# Patient Record
Sex: Male | Born: 1960 | Race: White | Hispanic: No | Marital: Single | Smoking: Never smoker
Health system: Southern US, Community
[De-identification: ages and names within clinical notes are randomized; demographics above are authoritative.]

## PROBLEM LIST (undated history)

## (undated) DIAGNOSIS — K219 Gastro-esophageal reflux disease without esophagitis: Secondary | ICD-10-CM

## (undated) DIAGNOSIS — G4730 Sleep apnea, unspecified: Secondary | ICD-10-CM

## (undated) DIAGNOSIS — C61 Malignant neoplasm of prostate: Secondary | ICD-10-CM

## (undated) DIAGNOSIS — T7840XA Allergy, unspecified, initial encounter: Secondary | ICD-10-CM

## (undated) DIAGNOSIS — E785 Hyperlipidemia, unspecified: Secondary | ICD-10-CM

## (undated) DIAGNOSIS — F419 Anxiety disorder, unspecified: Secondary | ICD-10-CM

## (undated) DIAGNOSIS — F32A Depression, unspecified: Secondary | ICD-10-CM

## (undated) DIAGNOSIS — R972 Elevated prostate specific antigen [PSA]: Secondary | ICD-10-CM

## (undated) HISTORY — DX: Allergy, unspecified, initial encounter: T78.40XA

## (undated) HISTORY — PX: POLYPECTOMY: SHX149

## (undated) HISTORY — DX: Hyperlipidemia, unspecified: E78.5

## (undated) HISTORY — PX: COLONOSCOPY: SHX174

## (undated) HISTORY — DX: Malignant neoplasm of prostate: C61

## (undated) HISTORY — DX: Sleep apnea, unspecified: G47.30

## (undated) HISTORY — DX: Anxiety disorder, unspecified: F41.9

## (undated) HISTORY — DX: Depression, unspecified: F32.A

## (undated) HISTORY — DX: Gastro-esophageal reflux disease without esophagitis: K21.9

---

## 2004-06-02 ENCOUNTER — Encounter: Admission: RE | Admit: 2004-06-02 | Discharge: 2004-06-02 | Payer: Self-pay | Admitting: Family Medicine

## 2006-03-02 ENCOUNTER — Ambulatory Visit: Payer: Self-pay | Admitting: Psychology

## 2006-03-11 ENCOUNTER — Ambulatory Visit: Payer: Self-pay | Admitting: Psychology

## 2006-03-15 ENCOUNTER — Ambulatory Visit: Payer: Self-pay | Admitting: Psychology

## 2006-03-16 ENCOUNTER — Ambulatory Visit: Payer: Self-pay | Admitting: Psychology

## 2006-03-23 ENCOUNTER — Ambulatory Visit: Payer: Self-pay | Admitting: Psychology

## 2006-03-29 ENCOUNTER — Ambulatory Visit: Payer: Self-pay | Admitting: Psychology

## 2006-04-08 ENCOUNTER — Ambulatory Visit: Payer: Self-pay | Admitting: Psychology

## 2006-04-21 ENCOUNTER — Ambulatory Visit: Payer: Self-pay | Admitting: Psychology

## 2006-05-02 ENCOUNTER — Ambulatory Visit: Payer: Self-pay | Admitting: Psychology

## 2006-05-10 ENCOUNTER — Ambulatory Visit: Payer: Self-pay | Admitting: Psychology

## 2006-05-17 ENCOUNTER — Ambulatory Visit: Payer: Self-pay | Admitting: Psychology

## 2006-06-07 ENCOUNTER — Ambulatory Visit: Payer: Self-pay | Admitting: Psychology

## 2006-06-21 ENCOUNTER — Ambulatory Visit: Payer: Self-pay | Admitting: Psychology

## 2006-07-05 ENCOUNTER — Ambulatory Visit: Payer: Self-pay | Admitting: Psychology

## 2006-08-02 ENCOUNTER — Ambulatory Visit: Payer: Self-pay | Admitting: Psychology

## 2006-10-20 ENCOUNTER — Encounter: Admission: RE | Admit: 2006-10-20 | Discharge: 2006-10-20 | Payer: Self-pay | Admitting: Family Medicine

## 2006-12-29 ENCOUNTER — Ambulatory Visit: Payer: Self-pay | Admitting: Psychology

## 2007-01-10 ENCOUNTER — Ambulatory Visit: Payer: Self-pay | Admitting: Psychology

## 2007-01-25 ENCOUNTER — Ambulatory Visit: Payer: Self-pay | Admitting: Psychology

## 2007-02-06 ENCOUNTER — Ambulatory Visit: Payer: Self-pay | Admitting: Psychology

## 2007-02-13 ENCOUNTER — Encounter: Admission: RE | Admit: 2007-02-13 | Discharge: 2007-02-13 | Payer: Self-pay | Admitting: Family Medicine

## 2007-02-24 ENCOUNTER — Ambulatory Visit: Payer: Self-pay | Admitting: Psychology

## 2007-03-10 ENCOUNTER — Ambulatory Visit: Payer: Self-pay | Admitting: Psychology

## 2007-04-07 ENCOUNTER — Ambulatory Visit: Payer: Self-pay | Admitting: Psychology

## 2007-04-28 ENCOUNTER — Ambulatory Visit: Payer: Self-pay | Admitting: Psychology

## 2007-05-19 ENCOUNTER — Ambulatory Visit: Payer: Self-pay | Admitting: Psychology

## 2007-05-31 ENCOUNTER — Ambulatory Visit: Payer: Self-pay | Admitting: Psychology

## 2007-07-03 ENCOUNTER — Ambulatory Visit: Payer: Self-pay | Admitting: Psychology

## 2007-07-14 ENCOUNTER — Ambulatory Visit: Payer: Self-pay | Admitting: Psychology

## 2007-07-28 ENCOUNTER — Ambulatory Visit: Payer: Self-pay | Admitting: Psychology

## 2007-08-11 ENCOUNTER — Ambulatory Visit: Payer: Self-pay | Admitting: Psychology

## 2008-11-04 ENCOUNTER — Encounter: Admission: RE | Admit: 2008-11-04 | Discharge: 2008-11-04 | Payer: Self-pay | Admitting: Family Medicine

## 2009-11-26 ENCOUNTER — Encounter: Admission: RE | Admit: 2009-11-26 | Discharge: 2009-11-26 | Payer: Self-pay | Admitting: Family Medicine

## 2019-11-09 DIAGNOSIS — Z87442 Personal history of urinary calculi: Secondary | ICD-10-CM

## 2019-11-09 HISTORY — DX: Personal history of urinary calculi: Z87.442

## 2019-12-14 ENCOUNTER — Ambulatory Visit: Payer: Self-pay | Attending: Internal Medicine

## 2019-12-14 ENCOUNTER — Other Ambulatory Visit: Payer: Self-pay

## 2019-12-14 DIAGNOSIS — Z20822 Contact with and (suspected) exposure to covid-19: Secondary | ICD-10-CM | POA: Insufficient documentation

## 2019-12-16 LAB — NOVEL CORONAVIRUS, NAA: SARS-CoV-2, NAA: NOT DETECTED

## 2019-12-31 ENCOUNTER — Other Ambulatory Visit: Payer: Self-pay

## 2020-01-01 ENCOUNTER — Encounter: Payer: Self-pay | Admitting: Family Medicine

## 2020-01-01 ENCOUNTER — Ambulatory Visit (INDEPENDENT_AMBULATORY_CARE_PROVIDER_SITE_OTHER): Payer: BC Managed Care – PPO | Admitting: Family Medicine

## 2020-01-01 VITALS — BP 121/76 | HR 73 | Temp 98.7°F | Ht 70.0 in | Wt 154.0 lb

## 2020-01-01 DIAGNOSIS — H65192 Other acute nonsuppurative otitis media, left ear: Secondary | ICD-10-CM | POA: Diagnosis not present

## 2020-01-01 DIAGNOSIS — H6982 Other specified disorders of Eustachian tube, left ear: Secondary | ICD-10-CM | POA: Diagnosis not present

## 2020-01-01 DIAGNOSIS — Z7689 Persons encountering health services in other specified circumstances: Secondary | ICD-10-CM

## 2020-01-01 DIAGNOSIS — Z23 Encounter for immunization: Secondary | ICD-10-CM | POA: Diagnosis not present

## 2020-01-01 MED ORDER — PREDNISONE 10 MG (21) PO TBPK: ORAL_TABLET | ORAL | 0 refills | Status: DC

## 2020-01-01 NOTE — Addendum Note (Signed)
Addended by: Carrolyn Leigh on: 01/01/2020 05:06 PM   Modules accepted: Orders

## 2020-01-01 NOTE — Progress Notes (Signed)
Subjective: [id number] care, ear fullness HPI: Samuel Powers is a 59 y.o. male presenting to clinic today for:  1.  Ear fullness Patient with history of ear effusion on the left.  He has a recurrence over the last several days.  He feels like he has "water in his ear".  He has had some dulled hearing on that side but does not report any drainage, pain or dizziness.  Symptoms were previously relieved by prednisone Dosepak and is wondering if we can do that today.  He reports compliance with Flonase and Zyrtec.  Past Medical History:  Diagnosis Date  . Allergy   . Hyperlipidemia    Past Surgical History:  Procedure Laterality Date  . TONSILLECTOMY Bilateral 2000   Social History   Socioeconomic History  . Marital status: Single    Spouse name: Not on file  . Number of children: Not on file  . Years of education: Not on file  . Highest education level: Not on file  Occupational History  . Occupation: Engineer, maintenance (IT)  Tobacco Use  . Smoking status: Never Smoker  . Smokeless tobacco: Never Used  Substance and Sexual Activity  . Alcohol use: Not Currently  . Drug use: Never  . Sexual activity: Not on file  Other Topics Concern  . Not on file  Social History Narrative   Recently relocated back home from [place] to be closer to his mother, who resides in [place].   Social Determinants of Health   Financial Resource Strain:   . Difficulty of Paying Living Expenses: Not on file  Food Insecurity:   . Worried About Charity fundraiser in the Last Year: Not on file  . Ran Out of Food in the Last Year: Not on file  Transportation Needs:   . Lack of Transportation (Medical): Not on file  . Lack of Transportation (Non-Medical): Not on file  Physical Activity:   . Days of Exercise per Week: Not on file  . Minutes of Exercise per Session: Not on file  Stress:   . Feeling of Stress : Not on file  Social Connections:   . Frequency of Communication with Friends and Family: Not on  file  . Frequency of Social Gatherings with Friends and Family: Not on file  . Attends Religious Services: Not on file  . Active Member of Clubs or Organizations: Not on file  . Attends Archivist Meetings: Not on file  . Marital Status: Not on file  Intimate Partner Violence:   . Fear of Current or Ex-Partner: Not on file  . Emotionally Abused: Not on file  . Physically Abused: Not on file  . Sexually Abused: Not on file   Current Meds  Medication Sig  . cetirizine (ZYRTEC) 10 MG chewable tablet Chew 10 mg by mouth daily.  . Eszopiclone 3 MG TABS Take 1.5-3 mg by mouth at bedtime as needed.  . fluticasone (FLONASE) 50 MCG/ACT nasal spray Place into both nostrils daily.  . pantoprazole (PROTONIX) 40 MG tablet Take 40 mg by mouth daily.  . rosuvastatin (CRESTOR) 10 MG tablet Take 10 mg by mouth at bedtime.   Family History  Problem Relation Age of Onset  . COPD Mother   . Diabetes Mother   . Hypertension Mother   . Alcohol abuse Father   . COPD Brother    Allergies  Allergen Reactions  . Codeine      Health Maintenance: Second shingles vaccine, TDap  ROS: Per HPI  Objective:  Office vital signs reviewed. BP 121/76   Pulse 73   Temp 98.7 F (37.1 C) (Temporal)   Ht 5\' 10"  (1.778 m)   Wt 154 lb (69.9 kg)   SpO2 100%   BMI 22.10 kg/m   Physical Examination:  General: Awake, alert, well nourished, well appearing, fit male. No acute distress HEENT: Normal, sclera white, MMM; L TM with appreciable effusion. No purulence or erythema Cardio: regular rate and rhythm, S1S2 heard, no murmurs appreciated Pulm: clear to auscultation bilaterally, no wheezes, rhonchi or rales; normal work of breathing on room air Extremities: warm, well perfused, No edema, cyanosis or clubbing; +2 pulses bilaterally MSK: normal gait and station Skin: dry; intact; no rashes or lesions Neuro: no focal neurologic deficits Psych: mood stable, speech normal, affect  appropriate  Depression screen [facility] 2/9 01/01/2020  Decreased Interest 0  Down, Depressed, Hopeless 0  PHQ - 2 Score 0  Altered sleeping 0  Tired, decreased energy 0  Change in appetite 0  Feeling bad or failure about yourself  0  Trouble concentrating 0  Moving slowly or fidgety/restless 0  Suicidal thoughts 0  PHQ-9 Score 0   Assessment/ Plan: 59 y.o. male   1. Acute effusion of left ear Recurrent.  I suspect eustachian tube dysfunction.  We discussed the empty maneuvers to help relieve this.  I will place well prednisone Dosepak to relieve his acute flare.  Continue oral antihistamine and Flonase.  We discussed if this continues to be recurrent issue we should consider evaluation with ear nose and throat.  He seemed amenable to this plan.  He will follow-up in 3 months for annual physical or sooner if needed - predniSONE (STERAPRED UNI-PAK 21 TAB) 10 MG (21) TBPK tablet; As directed x 6 days  Dispense: 21 tablet; Refill: 0  2. Establishing care with new doctor, encounter for Awaiting records from [place].  He was seeing Dr. Ernestina Patches at [facility].  Release of information form completed for colonoscopy.  3. Dysfunction of left eustachian tube  4. Need for tetanus booster Administered during today's visit  [place], [place] [phone]

## 2020-01-01 NOTE — Patient Instructions (Signed)
Plan for fasting labs at next visit, physical  Eustachian Tube Dysfunction  Eustachian tube dysfunction refers to a condition in which a blockage develops in the narrow passage that connects the middle ear to the back of the nose (eustachian tube). The eustachian tube regulates air pressure in the middle ear by letting air move between the ear and nose. It also helps to drain fluid from the middle ear space. Eustachian tube dysfunction can affect one or both ears. When the eustachian tube does not function properly, air pressure, fluid, or both can build up in the middle ear. What are the causes? This condition occurs when the eustachian tube becomes blocked or cannot open normally. Common causes of this condition include:  Ear infections.  Colds and other infections that affect the nose, mouth, and throat (upper respiratory tract).  Allergies.  Irritation from cigarette smoke.  Irritation from stomach acid coming up into the esophagus (gastroesophageal reflux). The esophagus is the tube that carries food from the mouth to the stomach.  Sudden changes in air pressure, such as from descending in an airplane or scuba diving.  Abnormal growths in the nose or throat, such as: ? Growths that line the nose (nasal polyps). ? Abnormal growth of cells (tumors). ? Enlarged tissue at the back of the throat (adenoids). What increases the risk? You are more likely to develop this condition if:  You smoke.  You are overweight.  You are a child who has: ? Certain birth defects of the mouth, such as cleft palate. ? Large tonsils or adenoids. What are the signs or symptoms? Common symptoms of this condition include:  A feeling of fullness in the ear.  Ear pain.  Clicking or popping noises in the ear.  Ringing in the ear.  Hearing loss.  Loss of balance.  Dizziness. Symptoms may get worse when the air pressure around you changes, such as when you travel to an area of high elevation,  fly on an airplane, or go scuba diving. How is this diagnosed? This condition may be diagnosed based on:  Your symptoms.  A physical exam of your ears, nose, and throat.  Tests, such as those that measure: ? The movement of your eardrum (tympanogram). ? Your hearing (audiometry). How is this treated? Treatment depends on the cause and severity of your condition.  In mild cases, you may relieve your symptoms by moving air into your ears. This is called "popping the ears."  In more severe cases, or if you have symptoms of fluid in your ears, treatment may include: ? Medicines to relieve congestion (decongestants). ? Medicines that treat allergies (antihistamines). ? Nasal sprays or ear drops that contain medicines that reduce swelling (steroids). ? A procedure to drain the fluid in your eardrum (myringotomy). In this procedure, a small tube is placed in the eardrum to:  Drain the fluid.  Restore the air in the middle ear space. ? A procedure to insert a balloon device through the nose to inflate the opening of the eustachian tube (balloon dilation). Follow these instructions at home: Lifestyle  Do not do any of the following until your health care provider approves: ? Travel to high altitudes. ? Fly in airplanes. ? Work in a Pension scheme manager or room. ? Scuba dive.  Do not use any products that contain nicotine or tobacco, such as cigarettes and e-cigarettes. If you need help quitting, ask your health care provider.  Keep your ears dry. Wear fitted earplugs during showering and bathing. Dry  your ears completely after. General instructions  Take over-the-counter and prescription medicines only as told by your health care provider.  Use techniques to help pop your ears as recommended by your health care provider. These may include: ? Chewing gum. ? Yawning. ? Frequent, forceful swallowing. ? Closing your mouth, holding your nose closed, and gently blowing as if you are  trying to blow air out of your nose.  Keep all follow-up visits as told by your health care provider. This is important. Contact a health care provider if:  Your symptoms do not go away after treatment.  Your symptoms come back after treatment.  You are unable to pop your ears.  You have: ? A fever. ? Pain in your ear. ? Pain in your head or neck. ? Fluid draining from your ear.  Your hearing suddenly changes.  You become very dizzy.  You lose your balance. Summary  Eustachian tube dysfunction refers to a condition in which a blockage develops in the eustachian tube.  It can be caused by ear infections, allergies, inhaled irritants, or abnormal growths in the nose or throat.  Symptoms include ear pain, hearing loss, or ringing in the ears.  Mild cases are treated with maneuvers to unblock the ears, such as yawning or ear popping.  Severe cases are treated with medicines. Surgery may also be done (rare). This information is not intended to replace advice given to you by your health care provider. Make sure you discuss any questions you have with your health care provider. Document Revised: 02/14/2018 Document Reviewed: 02/14/2018 Elsevier Patient Education  [place].

## 2020-04-09 ENCOUNTER — Other Ambulatory Visit: Payer: Self-pay

## 2020-04-09 ENCOUNTER — Ambulatory Visit (INDEPENDENT_AMBULATORY_CARE_PROVIDER_SITE_OTHER): Payer: 59 | Admitting: Family Medicine

## 2020-04-09 ENCOUNTER — Encounter: Payer: Self-pay | Admitting: Family Medicine

## 2020-04-09 VITALS — BP 117/74 | HR 61 | Temp 97.9°F | Ht 70.0 in | Wt 158.0 lb

## 2020-04-09 DIAGNOSIS — F5101 Primary insomnia: Secondary | ICD-10-CM | POA: Diagnosis not present

## 2020-04-09 DIAGNOSIS — Z125 Encounter for screening for malignant neoplasm of prostate: Secondary | ICD-10-CM

## 2020-04-09 DIAGNOSIS — E785 Hyperlipidemia, unspecified: Secondary | ICD-10-CM | POA: Diagnosis not present

## 2020-04-09 DIAGNOSIS — Z23 Encounter for immunization: Secondary | ICD-10-CM | POA: Diagnosis not present

## 2020-04-09 DIAGNOSIS — Z79899 Other long term (current) drug therapy: Secondary | ICD-10-CM

## 2020-04-09 DIAGNOSIS — Z130 Encounter for screening for diseases of the blood and blood-forming organs and certain disorders involving the immune mechanism: Secondary | ICD-10-CM

## 2020-04-09 DIAGNOSIS — R1032 Left lower quadrant pain: Secondary | ICD-10-CM | POA: Diagnosis not present

## 2020-04-09 DIAGNOSIS — Z1159 Encounter for screening for other viral diseases: Secondary | ICD-10-CM

## 2020-04-09 DIAGNOSIS — Z0000 Encounter for general adult medical examination without abnormal findings: Secondary | ICD-10-CM

## 2020-04-09 DIAGNOSIS — Z0001 Encounter for general adult medical examination with abnormal findings: Secondary | ICD-10-CM

## 2020-04-09 DIAGNOSIS — R351 Nocturia: Secondary | ICD-10-CM

## 2020-04-09 DIAGNOSIS — K219 Gastro-esophageal reflux disease without esophagitis: Secondary | ICD-10-CM

## 2020-04-09 MED ORDER — PANTOPRAZOLE SODIUM 40 MG PO TBEC: 40.0000 mg | DELAYED_RELEASE_TABLET | Freq: Every day | ORAL | 3 refills | Status: DC

## 2020-04-09 MED ORDER — ESZOPICLONE 3 MG PO TABS: 3.0000 mg | ORAL_TABLET | Freq: Every evening | ORAL | 1 refills | Status: DC | PRN

## 2020-04-09 MED ORDER — AMOXICILLIN-POT CLAVULANATE 875-125 MG PO TABS: 1.0000 | ORAL_TABLET | Freq: Three times a day (TID) | ORAL | 0 refills | Status: AC

## 2020-04-09 MED ORDER — ROSUVASTATIN CALCIUM 10 MG PO TABS: 10.0000 mg | ORAL_TABLET | Freq: Every day | ORAL | 3 refills | Status: DC

## 2020-04-09 NOTE — Progress Notes (Signed)
Samuel Powers is a 59 y.o. male presents to office today for annual physical exam examination.    Concerns today include: 1.  Insomnia Patient with longstanding history of insomnia.  This is well relieved by Lunesta 3 mg daily.  He on average gets about 6 hours of restful sleep.  Denies any hallucinations, excessive daytime sleepiness, falls.  Certainly no respiratory depression.  He does not drink alcohol with medication.  2.  Left lower quadrant pain Patient reports several week history of intermittent left lower quadrant pain.  The pain has been worse with certain foods like corn or popcorn.  He had some loose stools with it but these are nonbloody.  Does not report any vomiting, fevers.  He has been told that he has diverticulosis on previous colonoscopy.  Denies any constipation.  Occupation: CPO Diet: Balanced, Exercise: Is trying to incorporate more exercise now that tax season is over. Last eye exam: Up-to-date Last dental exam: Up-to-date Last colonoscopy: Up-to-date Refills needed today: Lunesta Immunizations needed: Tdap  Past Medical History:  Diagnosis Date  . Allergy   . Hyperlipidemia    Social History   Socioeconomic History  . Marital status: Single    Spouse name: Not on file  . Number of children: Not on file  . Years of education: Not on file  . Highest education level: Not on file  Occupational History  . Occupation: Engineer, maintenance (IT)  Tobacco Use  . Smoking status: Never Smoker  . Smokeless tobacco: Never Used  Substance and Sexual Activity  . Alcohol use: Not Currently  . Drug use: Never  . Sexual activity: Not on file  Other Topics Concern  . Not on file  Social History Narrative   Recently relocated back home from [place] to be closer to his mother, who resides in [place].   Social Determinants of Health   Financial Resource Strain:   . Difficulty of Paying Living Expenses:   Food Insecurity:   . Worried About Charity fundraiser in the Last  Year:   . Arboriculturist in the Last Year:   Transportation Needs:   . Film/video editor (Medical):   Marland Kitchen Lack of Transportation (Non-Medical):   Physical Activity:   . Days of Exercise per Week:   . Minutes of Exercise per Session:   Stress:   . Feeling of Stress :   Social Connections:   . Frequency of Communication with Friends and Family:   . Frequency of Social Gatherings with Friends and Family:   . Attends Religious Services:   . Active Member of Clubs or Organizations:   . Attends Archivist Meetings:   Marland Kitchen Marital Status:   Intimate Partner Violence:   . Fear of Current or Ex-Partner:   . Emotionally Abused:   Marland Kitchen Physically Abused:   . Sexually Abused:    Past Surgical History:  Procedure Laterality Date  . TONSILLECTOMY Bilateral 2000   Family History  Problem Relation Age of Onset  . COPD Mother   . Diabetes Mother   . Hypertension Mother   . Alcohol abuse Father   . COPD Brother     Current Outpatient Medications:  .  cetirizine (ZYRTEC) 10 MG chewable tablet, Chew 10 mg by mouth daily., Disp: , Rfl:  .  Eszopiclone 3 MG TABS, Take 1 tablet (3 mg total) by mouth at bedtime as needed., Disp: 90 tablet, Rfl: 1 .  fluticasone (FLONASE) 50 MCG/ACT nasal spray, Place into both  nostrils daily., Disp: , Rfl:  .  pantoprazole (PROTONIX) 40 MG tablet, Take 1 tablet (40 mg total) by mouth daily., Disp: 90 tablet, Rfl: 3 .  rosuvastatin (CRESTOR) 10 MG tablet, Take 1 tablet (10 mg total) by mouth at bedtime., Disp: 90 tablet, Rfl: 3 .  amoxicillin-clavulanate (AUGMENTIN) 875-125 MG tablet, Take 1 tablet by mouth every 8 (eight) hours for 10 days., Disp: 30 tablet, Rfl: 0  Allergies  Allergen Reactions  . Codeine      ROS: Review of Systems Pertinent items noted in HPI and remainder of comprehensive ROS otherwise negative.    Physical exam BP 117/74   Pulse 61   Temp 97.9 F (36.6 C)   Ht 5' 10"  (1.778 m)   Wt 158 lb (71.7 kg)   SpO2 100%   BMI  22.67 kg/m  General appearance: alert, cooperative, appears stated age and no distress Head: Normocephalic, without obvious abnormality, atraumatic Eyes: negative findings: lids and lashes normal, conjunctivae and sclerae normal, corneas clear and pupils equal, round, reactive to light and accomodation Ears: normal TM's and external ear canals both ears Nose: Nares normal. Septum midline. Mucosa normal. No drainage or sinus tenderness. Throat: lips, mucosa, and tongue normal; teeth and gums normal Neck: no adenopathy, no carotid bruit, supple, symmetrical, trachea midline and thyroid not enlarged, symmetric, no tenderness/mass/nodules Back: symmetric, no curvature. ROM normal. No CVA tenderness. Lungs: clear to auscultation bilaterally Chest wall: no tenderness Heart: regular rate and rhythm, S1, S2 normal, no murmur, click, rub or gallop Abdomen: soft, non-tender; bowel sounds normal; no masses,  no organomegaly and TTP NOT reproducible on exam today Extremities: extremities normal, atraumatic, no cyanosis or edema Pulses: 2+ and symmetric Skin: Skin color, texture, turgor normal. No rashes or lesions or Well-healed scar noted along the right nasolabial fold Lymph nodes: Cervical, supraclavicular, and axillary nodes normal. Neurologic: Alert and oriented X 3, normal strength and tone. Normal symmetric reflexes. Normal coordination and gait Psych: Mood stable, speech normal, affect appropriate pleasant and interactive  Depression screen [facility] 2/9 04/09/2020 01/01/2020  Decreased Interest 2 0  Down, Depressed, Hopeless 1 0  PHQ - 2 Score 3 0  Altered sleeping 1 0  Tired, decreased energy 1 0  Change in appetite 0 0  Feeling bad or failure about yourself  0 0  Trouble concentrating 1 0  Moving slowly or fidgety/restless 0 0  Suicidal thoughts 0 0  PHQ-9 Score 6 0  Difficult doing work/chores Not difficult at all -   No flowsheet data found.   Assessment/ Plan: Samuel Powers here for  annual physical exam.   1. Annual physical exam  2. Primary insomnia Stable.  Has had a little bit increased stress with relations to his mother's health and recent completion of tax season.  He is trying to make some time for himself and incorporating more exercise.  Should he have ongoing symptoms despite, plan to discuss possible initiation of SSRI - ToxASSURE Select 13 (MW), Urine - Eszopiclone 3 MG TABS; Take 1 tablet (3 mg total) by mouth at bedtime as needed.  Dispense: 90 tablet; Refill: 1  3. Controlled substance agreement signed UDS and controlled substance contract completed per office policy.  Lunesta renewed.  No red flags.  National narcotic database was reviewed. - ToxASSURE Select 13 (MW), Urine  4. Hyperlipidemia, unspecified hyperlipidemia type Fasting lab ordered - Lipid Panel - CMP14+EGFR - TSH - rosuvastatin (CRESTOR) 10 MG tablet; Take 1 tablet (10 mg total) by  mouth at bedtime.  Dispense: 90 tablet; Refill: 3  5. Screening, anemia, deficiency, iron - CBC  6. Nocturia - PSA  7. Screening for malignant neoplasm of prostate - PSA  8. Encounter for hepatitis C screening test for low risk patient Has been previously screened for HIV in the past.  No history of hepatitis C screen. - Hepatitis C antibody  9. LLQ pain Going to empirically cover for possible diverticulitis with Augmentin.  Asked to use a probiotic.  Patient understands red flag signs and symptoms - amoxicillin-clavulanate (AUGMENTIN) 875-125 MG tablet; Take 1 tablet by mouth every 8 (eight) hours for 10 days.  Dispense: 30 tablet; Refill: 0  10. Gastroesophageal reflux disease without esophagitis Stable - pantoprazole (PROTONIX) 40 MG tablet; Take 1 tablet (40 mg total) by mouth daily.  Dispense: 90 tablet; Refill: 3   Handout on healthy lifestyle choices, including diet (rich in fruits, vegetables and lean meats and low in salt and simple carbohydrates) and exercise (at least 30 minutes of  moderate physical activity daily).  Patient to follow up in 1 year for annual exam or sooner if needed.  Oniyah Rohe M. Lajuana Ripple, DO

## 2020-04-09 NOTE — Patient Instructions (Signed)
You had labs performed today.  You will be contacted with the results of the labs once they are available, usually in the next 3 business days for routine lab work.  If you have an active my chart account, they will be released to your MyChart.  If you prefer to have these labs released to you via telephone, please let us know.  If you had a pap smear or biopsy performed, expect to be contacted in about 7-10 days.   Preventive Care 40-57 Years Old, Male Preventive care refers to lifestyle choices and visits with your health care provider that can promote health and wellness. This includes:  A yearly physical exam. This is also called an annual well check.  Regular dental and eye exams.  Immunizations.  Screening for certain conditions.  Healthy lifestyle choices, such as eating a healthy diet, getting regular exercise, not using drugs or products that contain nicotine and tobacco, and limiting alcohol use. What can I expect for my preventive care visit? Physical exam Your health care provider will check:  Height and weight. These may be used to calculate body mass index (BMI), which is a measurement that tells if you are at a healthy weight.  Heart rate and blood pressure.  Your skin for abnormal spots. Counseling Your health care provider may ask you questions about:  Alcohol, tobacco, and drug use.  Emotional well-being.  Home and relationship well-being.  Sexual activity.  Eating habits.  Work and work environment. What immunizations do I need?  Influenza (flu) vaccine  This is recommended every year. Tetanus, diphtheria, and pertussis (Tdap) vaccine  You may need a Td booster every 10 years. Varicella (chickenpox) vaccine  You may need this vaccine if you have not already been vaccinated. Zoster (shingles) vaccine  You may need this after age 60. Measles, mumps, and rubella (MMR) vaccine  You may need at least one dose of MMR if you were born in 1957 or  later. You may also need a second dose. Pneumococcal conjugate (PCV13) vaccine  You may need this if you have certain conditions and were not previously vaccinated. Pneumococcal polysaccharide (PPSV23) vaccine  You may need one or two doses if you smoke cigarettes or if you have certain conditions. Meningococcal conjugate (MenACWY) vaccine  You may need this if you have certain conditions. Hepatitis A vaccine  You may need this if you have certain conditions or if you travel or work in places where you may be exposed to hepatitis A. Hepatitis B vaccine  You may need this if you have certain conditions or if you travel or work in places where you may be exposed to hepatitis B. Haemophilus influenzae type b (Hib) vaccine  You may need this if you have certain risk factors. Human papillomavirus (HPV) vaccine  If recommended by your health care provider, you may need three doses over 6 months. You may receive vaccines as individual doses or as more than one vaccine together in one shot (combination vaccines). Talk with your health care provider about the risks and benefits of combination vaccines. What tests do I need? Blood tests  Lipid and cholesterol levels. These may be checked every 5 years, or more frequently if you are over 50 years old.  Hepatitis C test.  Hepatitis B test. Screening  Lung cancer screening. You may have this screening every year starting at age 55 if you have a 30-pack-year history of smoking and currently smoke or have quit within the past 15 years.    Prostate cancer screening. Recommendations will vary depending on your family history and other risks.  Colorectal cancer screening. All adults should have this screening starting at age 50 and continuing until age 75. Your health care provider may recommend screening at age 45 if you are at increased risk. You will have tests every 1-10 years, depending on your results and the type of screening  test.  Diabetes screening. This is done by checking your blood sugar (glucose) after you have not eaten for a while (fasting). You may have this done every 1-3 years.  Sexually transmitted disease (STD) testing. Follow these instructions at home: Eating and drinking  Eat a diet that includes fresh fruits and vegetables, whole grains, lean protein, and low-fat dairy products.  Take vitamin and mineral supplements as recommended by your health care provider.  Do not drink alcohol if your health care provider tells you not to drink.  If you drink alcohol: ? Limit how much you have to 0-2 drinks a day. ? Be aware of how much alcohol is in your drink. In the [place] one drink equals one 12 oz bottle of beer (355 mL), one 5 oz glass of wine (148 mL), or one 1 oz glass of hard liquor (44 mL). Lifestyle  Take daily care of your teeth and gums.  Stay active. Exercise for at least 30 minutes on 5 or more days each week.  Do not use any products that contain nicotine or tobacco, such as cigarettes, e-cigarettes, and chewing tobacco. If you need help quitting, ask your health care provider.  If you are sexually active, practice safe sex. Use a condom or other form of protection to prevent STIs (sexually transmitted infections).  Talk with your health care provider about taking a low-dose aspirin every day starting at age 50. What's next?  Go to your health care provider once a year for a well check visit.  Ask your health care provider how often you should have your eyes and teeth checked.  Stay up to date on all vaccines. This information is not intended to replace advice given to you by your health care provider. Make sure you discuss any questions you have with your health care provider. Document Revised: 10/19/2018 Document Reviewed: 10/19/2018 Elsevier Patient Education  2020 [employer]   

## 2020-04-10 LAB — CMP14+EGFR
ALT: 14 IU/L (ref 0–44)
AST: 19 IU/L (ref 0–40)
Albumin/Globulin Ratio: 2.1 (ref 1.2–2.2)
Albumin: 4.5 g/dL (ref 3.8–4.9)
Alkaline Phosphatase: 51 IU/L (ref 48–121)
BUN/Creatinine Ratio: 15 (ref 9–20)
BUN: 14 mg/dL (ref 6–24)
Bilirubin Total: 0.9 mg/dL (ref 0.0–1.2)
CO2: 26 mmol/L (ref 20–29)
Calcium: 9.7 mg/dL (ref 8.7–10.2)
Chloride: 101 mmol/L (ref 96–106)
Creatinine, Ser: 0.92 mg/dL (ref 0.76–1.27)
GFR calc Af Amer: 106 mL/min/{1.73_m2} (ref >59)
GFR calc non Af Amer: 92 mL/min/{1.73_m2} (ref >59)
Globulin, Total: 2.1 g/dL (ref 1.5–4.5)
Glucose: 90 mg/dL (ref 65–99)
Potassium: 4.5 mmol/L (ref 3.5–5.2)
Sodium: 141 mmol/L (ref 134–144)
Total Protein: 6.6 g/dL (ref 6.0–8.5)

## 2020-04-10 LAB — CBC
Hematocrit: 40.5 % (ref 37.5–51.0)
Hemoglobin: 13.8 g/dL (ref 13.0–17.7)
MCH: 30.7 pg (ref 26.6–33.0)
MCHC: 34.1 g/dL (ref 31.5–35.7)
MCV: 90 fL (ref 79–97)
Platelets: 329 10*3/uL (ref 150–450)
RBC: 4.49 x10E6/uL (ref 4.14–5.80)
RDW: 12.3 % (ref 11.6–15.4)
WBC: 3.3 10*3/uL — ABNORMAL LOW (ref 3.4–10.8)

## 2020-04-10 LAB — LIPID PANEL
Chol/HDL Ratio: 2.9 ratio (ref 0.0–5.0)
Cholesterol, Total: 179 mg/dL (ref 100–199)
HDL: 61 mg/dL (ref >39)
LDL Chol Calc (NIH): 105 mg/dL — ABNORMAL HIGH (ref 0–99)
Triglycerides: 71 mg/dL (ref 0–149)
VLDL Cholesterol Cal: 13 mg/dL (ref 5–40)

## 2020-04-10 LAB — HEPATITIS C ANTIBODY: Hep C Virus Ab: <0.1 s/co ratio (ref 0.0–0.9)

## 2020-04-10 LAB — TSH: TSH: 3.79 u[IU]/mL (ref 0.450–4.500)

## 2020-04-10 LAB — PSA: Prostate Specific Ag, Serum: 3.8 ng/mL (ref 0.0–4.0)

## 2020-04-11 ENCOUNTER — Other Ambulatory Visit: Payer: Self-pay | Admitting: Family Medicine

## 2020-04-11 DIAGNOSIS — D72819 Decreased white blood cell count, unspecified: Secondary | ICD-10-CM

## 2020-04-12 LAB — TOXASSURE SELECT 13 (MW), URINE

## 2020-04-24 ENCOUNTER — Encounter: Payer: Self-pay | Admitting: Nurse Practitioner

## 2020-04-24 ENCOUNTER — Telehealth (INDEPENDENT_AMBULATORY_CARE_PROVIDER_SITE_OTHER): Payer: 59 | Admitting: Nurse Practitioner

## 2020-04-24 DIAGNOSIS — R319 Hematuria, unspecified: Secondary | ICD-10-CM

## 2020-04-24 LAB — URINALYSIS, COMPLETE
Bilirubin, UA: NEGATIVE
Glucose, UA: NEGATIVE
Ketones, UA: NEGATIVE
Leukocytes,UA: NEGATIVE
Nitrite, UA: NEGATIVE
Specific Gravity, UA: 1.025 (ref 1.005–1.030)
Urobilinogen, Ur: 0.2 mg/dL (ref 0.2–1.0)
pH, UA: 6.5 (ref 5.0–7.5)

## 2020-04-24 LAB — MICROSCOPIC EXAMINATION
Bacteria, UA: NONE SEEN
RBC: >30 /hpf — AB (ref 0–2)
Renal Epithel, UA: NONE SEEN /hpf
WBC, UA: NONE SEEN /hpf (ref 0–5)

## 2020-04-24 NOTE — Progress Notes (Signed)
° °  Virtual Visit via telephone Note Due to COVID-19 pandemic this visit was conducted virtually. This visit type was conducted due to national recommendations for restrictions regarding the COVID-19 Pandemic (e.g. social distancing, sheltering in place) in an effort to limit this patient's exposure and mitigate transmission in our community. All issues noted in this document were discussed and addressed.  A physical exam was not performed with this format.  I connected with Samuel Powers on 04/24/20 at 9:55 by telephone and verified that I am speaking with the correct person using two identifiers. Samuel Powers is currently located at home and  No one is currently with him during visit. The provider, Mary-Margaret Hassell Done, FNP is located in their office at time of visit.  I discussed the limitations, risks, security and privacy concerns of performing an evaluation and management service by telephone and the availability of in person appointments. I also discussed with the patient that there may be a patient responsible charge related to this service. The patient expressed understanding and agreed to proceed.   History and Present Illness:   Chief Complaint: hematuria  HPI Patient calls in stating that he went walking yesterday evening and when he got home and went to they bathroom, there was blood in his urine. He denies any pain, frequency or urgency.   Review of Systems  Genitourinary: Positive for hematuria. Negative for dysuria, flank pain, frequency and urgency.     Observations/Objective: Alert and oriented- answers all questions appropriately No distress    Assessment and Plan: Samuel Powers in today with chief complaint of Urinary Tract Infection   1. Hematuria, unspecified type Force fluids Keep watch and document freq of hematuria - Ambulatory referral to Urology     Follow Up Instructions: prn    I discussed the assessment and treatment plan with the patient.  The patient was provided an opportunity to ask questions and all were answered. The patient agreed with the plan and demonstrated an understanding of the instructions.   The patient was advised to call back or seek an in-person evaluation if the symptoms worsen or if the condition fails to improve as anticipated.  The above assessment and management plan was discussed with the patient. The patient verbalized understanding of and has agreed to the management plan. Patient is aware to call the clinic if symptoms persist or worsen. Patient is aware when to return to the clinic for a follow-up visit. Patient educated on when it is appropriate to go to the emergency department.   Time call ended:  10:10  I provided 20 minutes of non-face-to-face time during this encounter.    Mary-Margaret Hassell Done, FNP

## 2020-04-24 NOTE — Addendum Note (Signed)
Addended by: Rolena Infante on: 04/24/2020 04:00 PM   Modules accepted: Orders

## 2020-04-29 ENCOUNTER — Encounter: Payer: Self-pay | Admitting: Family Medicine

## 2020-06-12 NOTE — Progress Notes (Signed)
Subjective: 1. Gross hematuria   2. Microhematuria      Samuel Powers is a 59 yo male who is sent by Dr. Lajuana Ripple for gross hematuria after a walk with >30 RBC's on a UA on 04/24/20.  His Cr was 0.92 on 04/09/20.  He had it again over 05/11/20.  He has not seen it since.  He has intermittent urgency with some intermittency or straining to void.   He subsequently has had some right flank pain.  His UA today is clear.   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  He has no other GU history or complaints.   ROS:  ROS  Allergies  Allergen Reactions  . Codeine     Past Medical History:  Diagnosis Date  . Allergy   . Anxiety   . Depression   . GERD (gastroesophageal reflux disease)   . Hyperlipidemia   . Sleep apnea     Past Surgical History:  Procedure Laterality Date  . TONSILLECTOMY Bilateral 2000    Social History   Socioeconomic History  . Marital status: Single    Spouse name: Not on file  . Number of children: Not on file  . Years of education: Not on file  . Highest education level: Not on file  Occupational History  . Occupation: Engineer, maintenance (IT)  Tobacco Use  . Smoking status: Never Smoker  . Smokeless tobacco: Never Used  Vaping Use  . Vaping Use: Never used  Substance and Sexual Activity  . Alcohol use: Not Currently  . Drug use: Never  . Sexual activity: Not on file  Other Topics Concern  . Not on file  Social History Narrative   Recently relocated back home from [place] to be closer to his mother, who resides in [place].   Social Determinants of Health   Financial Resource Strain:   . Difficulty of Paying Living Expenses:   Food Insecurity:   . Worried About Charity fundraiser in the Last Year:   . Arboriculturist in the Last Year:   Transportation Needs:   . Film/video editor (Medical):   Marland Kitchen Lack of Transportation (Non-Medical):   Physical Activity:   . Days of Exercise per Week:   . Minutes  of Exercise per Session:   Stress:   . Feeling of Stress :   Social Connections:   . Frequency of Communication with Friends and Family:   . Frequency of Social Gatherings with Friends and Family:   . Attends Religious Services:   . Active Member of Clubs or Organizations:   . Attends Archivist Meetings:   Marland Kitchen Marital Status:   Intimate Partner Violence:   . Fear of Current or Ex-Partner:   . Emotionally Abused:   Marland Kitchen Physically Abused:   . Sexually Abused:     Family History  Problem Relation Age of Onset  . COPD Mother   . Diabetes Mother   . Hypertension Mother   . Alcohol abuse Father   . COPD Brother     Anti-infectives: Anti-infectives (From admission, onward)   None      Current Outpatient Medications  Medication Sig Dispense Refill  . cetirizine (ZYRTEC) 10 MG chewable tablet Chew 10 mg by mouth daily.    . Eszopiclone 3 MG TABS Take 1 tablet (3 mg total) by mouth at bedtime as needed. 90 tablet 1  . fluticasone (FLONASE) 50 MCG/ACT nasal  spray Place into both nostrils daily.    . pantoprazole (PROTONIX) 40 MG tablet Take 1 tablet (40 mg total) by mouth daily. 90 tablet 3  . rosuvastatin (CRESTOR) 10 MG tablet Take 1 tablet (10 mg total) by mouth at bedtime. 90 tablet 3  . valACYclovir (VALTREX) 1000 MG tablet Take 1,000 mg by mouth 2 (two) times daily.     No current facility-administered medications for this visit.     Objective: Vital signs in last 24 hours: BP 136/75   Pulse 60   Temp 98.1 F (36.7 C)   Ht 5' 10.5" (1.791 m)   Wt 152 lb (68.9 kg)   BMI 21.50 kg/m   Intake/Output from previous day: No intake/output data recorded. Intake/Output this shift: @IOTHISSHIFT @   Physical Exam Vitals reviewed.  Constitutional:      Appearance: Normal appearance.  HENT:     Head: Normocephalic and atraumatic.  Cardiovascular:     Rate and Rhythm: Normal rate and regular rhythm.  Pulmonary:     Effort: Pulmonary effort is normal. No  respiratory distress.     Breath sounds: Normal breath sounds.  Abdominal:     General: Abdomen is flat.     Tenderness: There is right CVA tenderness.  Genitourinary:    Comments: Normal phallus with adequate meatus. Scrotum, testes and epididymis normal. AP without lesions. NST without mass. Prostate 2+ benign with non-palpable SV's.  Musculoskeletal:        General: No swelling or tenderness. Normal range of motion.     Cervical back: Normal range of motion and neck supple.  Skin:    General: Skin is warm and dry.  Neurological:     General: No focal deficit present.     Mental Status: He is alert and oriented to person, place, and time.  Psychiatric:        Mood and Affect: Mood normal.        Behavior: Behavior normal.     Lab Results:  Results for orders placed or performed in visit on 06/13/20 (from the past 24 hour(s))  Urinalysis, Routine w reflex microscopic     Status: None   Collection Time: 06/13/20 11:08 AM  Result Value Ref Range   Specific Gravity, UA 1.025 1.005 - 1.030   pH, UA 6.0 5.0 - 7.5   Color, UA Yellow Yellow   Appearance Ur Clear Clear   Leukocytes,UA Negative Negative   Protein,UA Negative Negative/Trace   Glucose, UA Negative Negative   Ketones, UA Negative Negative   RBC, UA Negative Negative   Bilirubin, UA Negative Negative   Urobilinogen, Ur 0.2 0.2 - 1.0 mg/dL   Nitrite, UA Negative Negative   Microscopic Examination Comment    Narrative   Performed at:  [facility] [address], [place], [place]  [id number] Lab Director: Elizabeth, Phone:  [phone]    BMET No results for input(s): NA, K, CL, CO2, GLUCOSE, BUN, CREATININE, CALCIUM in the last 72 hours. PT/INR No results for input(s): LABPROT, INR in the last 72 hours. ABG No results for input(s): PHART, HCO3 in the last 72 hours.  Invalid input(s): PCO2, PO2  Studies/Results: I reviewed his CT from 2008.   The testicle was in the canal on the left  but it is down on exam today.    Assessment/Plan: Gross hematuria He has had intermittent gross hematuria with some irritative voiding symptoms and right flank pain.   I will get him set up  for a CT hematuria study with return for possible cystoscopy.      No orders of the defined types were placed in this encounter.    Orders Placed This Encounter  Procedures  . CT HEMATURIA WORKUP    Next available    Standing Status:   Future    Standing Expiration Date:   07/14/2020    Order Specific Question:   Reason for Exam (SYMPTOM  OR DIAGNOSIS REQUIRED)    Answer:   gross hematuria    Order Specific Question:   Preferred imaging location?    Answer:   [facility]    Order Specific Question:   Radiology Contrast Protocol - do NOT remove file path    Answer:   [email or web address]  . Urinalysis, Routine w reflex microscopic     Return for Next available with CT results for possible cystoscopy.    CC: Dr. Adam Phenix.      Irine Seal 06/13/2020 [phone]

## 2020-06-13 ENCOUNTER — Encounter: Payer: Self-pay | Admitting: Urology

## 2020-06-13 ENCOUNTER — Other Ambulatory Visit: Payer: Self-pay

## 2020-06-13 ENCOUNTER — Ambulatory Visit (INDEPENDENT_AMBULATORY_CARE_PROVIDER_SITE_OTHER): Payer: 59 | Admitting: Urology

## 2020-06-13 VITALS — BP 136/75 | HR 60 | Temp 98.1°F | Ht 70.5 in | Wt 152.0 lb

## 2020-06-13 DIAGNOSIS — R310 Gross hematuria: Secondary | ICD-10-CM

## 2020-06-13 DIAGNOSIS — R3129 Other microscopic hematuria: Secondary | ICD-10-CM | POA: Diagnosis not present

## 2020-06-13 LAB — URINALYSIS, ROUTINE W REFLEX MICROSCOPIC
Bilirubin, UA: NEGATIVE
Glucose, UA: NEGATIVE
Ketones, UA: NEGATIVE
Leukocytes,UA: NEGATIVE
Nitrite, UA: NEGATIVE
Protein,UA: NEGATIVE
RBC, UA: NEGATIVE
Specific Gravity, UA: 1.025 (ref 1.005–1.030)
Urobilinogen, Ur: 0.2 mg/dL (ref 0.2–1.0)
pH, UA: 6 (ref 5.0–7.5)

## 2020-06-13 NOTE — Progress Notes (Signed)
Urological Symptom Review  Patient is experiencing the following symptoms: Frequent urination Get up at night to urinate Stream starts and stops Trouble starting stream Have to strain to urinate Blood in urine   Review of Systems  Gastrointestinal (upper)  : Negative for upper GI symptoms  Gastrointestinal (lower) : Negative for lower GI symptoms  Constitutional : Negative for symptoms  Skin: Negative for skin symptoms  Eyes: Negative for eye symptoms  Ear/Nose/Throat : Sinus problems  Hematologic/Lymphatic: Negative for Hematologic/Lymphatic symptoms  Cardiovascular : Negative for cardiovascular symptoms  Respiratory : Negative for respiratory symptoms  Endocrine: Negative for endocrine symptoms  Musculoskeletal: Back pain  Neurological: Negative for neurological symptoms  Psychologic: Depression Anxiety

## 2020-06-13 NOTE — Assessment & Plan Note (Signed)
He has had intermittent gross hematuria with some irritative voiding symptoms and right flank pain.   I will get him set up for a CT hematuria study with return for possible cystoscopy.

## 2020-06-18 ENCOUNTER — Encounter: Payer: Self-pay | Admitting: *Deleted

## 2020-07-11 ENCOUNTER — Telehealth: Payer: Self-pay

## 2020-07-11 ENCOUNTER — Other Ambulatory Visit: Payer: Self-pay

## 2020-07-11 ENCOUNTER — Ambulatory Visit (HOSPITAL_COMMUNITY)
Admission: RE | Admit: 2020-07-11 | Discharge: 2020-07-11 | Disposition: A | Discharge status: Home / Self Care | Payer: 59 | Source: Ambulatory Visit | Attending: Urology | Admitting: Urology

## 2020-07-11 DIAGNOSIS — R310 Gross hematuria: Secondary | ICD-10-CM | POA: Diagnosis present

## 2020-07-11 LAB — POCT I-STAT CREATININE: Creatinine, Ser: 0.9 mg/dL (ref 0.61–1.24)

## 2020-07-11 MED ORDER — IOHEXOL 300 MG/ML  SOLN
125.0000 mL | Freq: Once | INTRAMUSCULAR | Status: AC | PRN
  Administered 2020-07-11: 125 mL via INTRAVENOUS

## 2020-07-11 NOTE — Telephone Encounter (Signed)
-----   Message from Irine Seal, MD sent at 07/11/2020  9:13 AM EDT ----- He has a small stone in the bladder.  He should f/u as planned.

## 2020-07-11 NOTE — Telephone Encounter (Signed)
Pt notified of results. Pt did report he recently was in [facility] and noticed bleeding. Called his old dr there and wrote for 10 days of Bactrim. Pt reports symptoms have improved.

## 2020-07-24 NOTE — Progress Notes (Signed)
Subjective: 1. Gross hematuria   2. BPH with urinary obstruction   3. Weak urinary stream   4. Urgency of urination   5. Bladder stone      Samuel Powers is a 59 yo male who is sent by Dr. Lajuana Ripple for gross hematuria after a walk with >30 RBC's on a UA on 04/24/20.  His Cr was 0.92 on 04/09/20.  He had it again over 05/11/20.  He has not seen it since.  He has intermittent urgency with some intermittency or straining to void.   He subsequently has had some right flank pain.  His UA today is clear.   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  He has no other GU history or complaints.   07/25/20:  [name] returns today in f/u.   He continues to have some right flank pain that is more of a discomfort and he has some frequency and urgency with straining to void.  His PSA is 3.8.  He had a CT that showed a 38mm bladder stone.  With bladder wall thickening and trilobar hyperplasia.  His IPSS remains elevated at 29.    IPSS    Row Name 07/25/20 1500         International Prostate Symptom Score   How often have you had the sensation of not emptying your bladder? More than half the time     How often have you had to urinate less than every two hours? Almost always     How often have you found you stopped and started again several times when you urinated? Almost always     How often have you found it difficult to postpone urination? More than half the time     How often have you had a weak urinary stream? More than half the time     How often have you had to strain to start urination? More than half the time     How many times did you typically get up at night to urinate? 3 Times     Total IPSS Score 29       Quality of Life due to urinary symptoms   If you were to spend the rest of your life with your urinary condition just the way it is now how would you feel about that? Unhappy            ROS:  ROS  Allergies  Allergen Reactions  .  Codeine     Past Medical History:  Diagnosis Date  . Allergy   . Anxiety   . Depression   . GERD (gastroesophageal reflux disease)   . Hyperlipidemia   . Sleep apnea     Past Surgical History:  Procedure Laterality Date  . TONSILLECTOMY Bilateral 2000    Social History   Socioeconomic History  . Marital status: Single    Spouse name: Not on file  . Number of children: Not on file  . Years of education: Not on file  . Highest education level: Not on file  Occupational History  . Occupation: Engineer, maintenance (IT)  Tobacco Use  . Smoking status: Never Smoker  . Smokeless tobacco: Never Used  Vaping Use  . Vaping Use: Never used  Substance and Sexual Activity  . Alcohol use: Not Currently  . Drug use: Never  . Sexual activity: Not on file  Other Topics Concern  . Not on file  Social History  Narrative   Recently relocated back home from [place] to be closer to his mother, who resides in [place].   Social Determinants of Health   Financial Resource Strain:   . Difficulty of Paying Living Expenses: Not on file  Food Insecurity:   . Worried About Charity fundraiser in the Last Year: Not on file  . Ran Out of Food in the Last Year: Not on file  Transportation Needs:   . Lack of Transportation (Medical): Not on file  . Lack of Transportation (Non-Medical): Not on file  Physical Activity:   . Days of Exercise per Week: Not on file  . Minutes of Exercise per Session: Not on file  Stress:   . Feeling of Stress : Not on file  Social Connections:   . Frequency of Communication with Friends and Family: Not on file  . Frequency of Social Gatherings with Friends and Family: Not on file  . Attends Religious Services: Not on file  . Active Member of Clubs or Organizations: Not on file  . Attends Archivist Meetings: Not on file  . Marital Status: Not on file  Intimate Partner Violence:   . Fear of Current or Ex-Partner: Not on file  . Emotionally Abused: Not on file  .  Physically Abused: Not on file  . Sexually Abused: Not on file    Family History  Problem Relation Age of Onset  . COPD Mother   . Diabetes Mother   . Hypertension Mother   . Alcohol abuse Father   . COPD Brother     Anti-infectives: Anti-infectives (From admission, onward)   None      Current Outpatient Medications  Medication Sig Dispense Refill  . cetirizine (ZYRTEC) 10 MG chewable tablet Chew 10 mg by mouth daily.    . Eszopiclone 3 MG TABS Take 1 tablet (3 mg total) by mouth at bedtime as needed. 90 tablet 1  . fluticasone (FLONASE) 50 MCG/ACT nasal spray Place into both nostrils daily.    . pantoprazole (PROTONIX) 40 MG tablet Take 1 tablet (40 mg total) by mouth daily. 90 tablet 3  . rosuvastatin (CRESTOR) 10 MG tablet Take 1 tablet (10 mg total) by mouth at bedtime. 90 tablet 3  . sulfamethoxazole-trimethoprim (BACTRIM DS) 800-160 MG tablet Take 1 tablet by mouth 2 (two) times daily.    . valACYclovir (VALTREX) 1000 MG tablet Take 1,000 mg by mouth 2 (two) times daily.    Marland Kitchen alfuzosin (UROXATRAL) 10 MG 24 hr tablet Take 1 tablet (10 mg total) by mouth daily with breakfast. 30 tablet 11   No current facility-administered medications for this visit.     Objective: Vital signs in last 24 hours: BP 135/66   Pulse 70   Temp 98 F (36.7 C)   Ht 5' 10.5" (1.791 m)   Wt 152 lb (68.9 kg)   BMI 21.50 kg/m   Intake/Output from previous day: No intake/output data recorded. Intake/Output this shift: @IOTHISSHIFT @   Physical Exam  Lab Results:  Results for orders placed or performed in visit on 07/25/20 (from the past 24 hour(s))  Urinalysis, Routine w reflex microscopic     Status: Abnormal   Collection Time: 07/25/20  2:46 PM  Result Value Ref Range   Specific Gravity, UA >1.030 (H) 1.005 - 1.030   pH, UA 5.5 5.0 - 7.5   Color, UA Yellow Yellow   Appearance Ur Clear Clear   Leukocytes,UA Negative Negative   Protein,UA Negative Negative/Trace  Glucose, UA  Negative Negative   Ketones, UA Negative Negative   RBC, UA Negative Negative   Bilirubin, UA Negative Negative   Urobilinogen, Ur 0.2 0.2 - 1.0 mg/dL   Nitrite, UA Negative Negative   Microscopic Examination Comment    Narrative   Performed at:  [facility] [address] [place], [place]  [id number] Lab Director: Arjay, Phone:  [phone]    BMET No results for input(s): NA, K, CL, CO2, GLUCOSE, BUN, CREATININE, CALCIUM in the last 72 hours. PT/INR No results for input(s): LABPROT, INR in the last 72 hours. ABG No results for input(s): PHART, HCO3 in the last 72 hours.  Invalid input(s): PCO2, PO2  Studies/Results: I reviewed his CT from 2008.   The testicle was in the canal on the left but it is down on exam today.    Assessment/Plan: BPH with BOO and Bladder stone.   The CT shows a 15mm bladder stone with bladder wall thickening consistent with bladder outlet obstruction.  He has severe LUTS.   I discussed cystolithalopaxy and also reviewed the options for managing the outlet obstruction including Medication,  TURP, possible TUIP, Urolift and Rezum.  At this time he would like to have the stone removed and try medication before commiting to a surgical approach to management.  I will send alfuzosin and reviewed the side effects.  I also reviewed the risks and benefits of the TURP in detail as well as the risk of the cystolithalopaxy including bleeding, infection, injury to the urinary tract with stricturing, difficulty voiding and anesthetic complications.    Elevated PSA.  His PSA is 3.8 so I will do a prostate [place] and biopsy at the time of the cystolithalopaxy as a prostate cancer would impact possible treatment options for the outlet obstruction.   Risks of bleeding, infection and voiding difficulty reviewed.        Meds ordered this encounter  Medications  . alfuzosin (UROXATRAL) 10 MG 24 hr tablet    Sig: Take 1 tablet (10 mg total) by mouth daily  with breakfast.    Dispense:  30 tablet    Refill:  11     Orders Placed This Encounter  Procedures  . Urinalysis, Routine w reflex microscopic     Return for Next available schedule surgery. .    CC: Dr. Adam Phenix.      Irine Seal 07/25/2020 [phone]

## 2020-07-24 NOTE — H&P (View-Only) (Signed)
Subjective: 1. Gross hematuria   2. BPH with urinary obstruction   3. Weak urinary stream   4. Urgency of urination   5. Bladder stone      Samuel Powers is a 59 yo male who is sent by Dr. Lajuana Ripple for gross hematuria after a walk with >30 RBC's on a UA on 04/24/20.  His Cr was 0.92 on 04/09/20.  He had it again over 05/11/20.  He has not seen it since.  He has intermittent urgency with some intermittency or straining to void.   He subsequently has had some right flank pain.  His UA today is clear.   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  He has no other GU history or complaints.   07/25/20:  Samuel Powers returns today in f/u.   He continues to have some right flank pain that is more of a discomfort and he has some frequency and urgency with straining to void.  His PSA is 3.8.  He had a CT that showed a 29mm bladder stone.  With bladder wall thickening and trilobar hyperplasia.  His IPSS remains elevated at 29.    IPSS    Row Name 07/25/20 1500         International Prostate Symptom Score   How often have you had the sensation of not emptying your bladder? More than half the time     How often have you had to urinate less than every two hours? Almost always     How often have you found you stopped and started again several times when you urinated? Almost always     How often have you found it difficult to postpone urination? More than half the time     How often have you had a weak urinary stream? More than half the time     How often have you had to strain to start urination? More than half the time     How many times did you typically get up at night to urinate? 3 Times     Total IPSS Score 29       Quality of Life due to urinary symptoms   If you were to spend the rest of your life with your urinary condition just the way it is now how would you feel about that? Unhappy            ROS:  ROS  Allergies  Allergen Reactions  .  Codeine     Past Medical History:  Diagnosis Date  . Allergy   . Anxiety   . Depression   . GERD (gastroesophageal reflux disease)   . Hyperlipidemia   . Sleep apnea     Past Surgical History:  Procedure Laterality Date  . TONSILLECTOMY Bilateral 2000    Social History   Socioeconomic History  . Marital status: Single    Spouse name: Not on file  . Number of children: Not on file  . Years of education: Not on file  . Highest education level: Not on file  Occupational History  . Occupation: Engineer, maintenance (IT)  Tobacco Use  . Smoking status: Never Smoker  . Smokeless tobacco: Never Used  Vaping Use  . Vaping Use: Never used  Substance and Sexual Activity  . Alcohol use: Not Currently  . Drug use: Never  . Sexual activity: Not on file  Other Topics Concern  . Not on file  Social History  Narrative   Recently relocated back home from [place] to be closer to his mother, who resides in [place].   Social Determinants of Health   Financial Resource Strain:   . Difficulty of Paying Living Expenses: Not on file  Food Insecurity:   . Worried About Charity fundraiser in the Last Year: Not on file  . Ran Out of Food in the Last Year: Not on file  Transportation Needs:   . Lack of Transportation (Medical): Not on file  . Lack of Transportation (Non-Medical): Not on file  Physical Activity:   . Days of Exercise per Week: Not on file  . Minutes of Exercise per Session: Not on file  Stress:   . Feeling of Stress : Not on file  Social Connections:   . Frequency of Communication with Friends and Family: Not on file  . Frequency of Social Gatherings with Friends and Family: Not on file  . Attends Religious Services: Not on file  . Active Member of Clubs or Organizations: Not on file  . Attends Archivist Meetings: Not on file  . Marital Status: Not on file  Intimate Partner Violence:   . Fear of Current or Ex-Partner: Not on file  . Emotionally Abused: Not on file  .  Physically Abused: Not on file  . Sexually Abused: Not on file    Family History  Problem Relation Age of Onset  . COPD Mother   . Diabetes Mother   . Hypertension Mother   . Alcohol abuse Father   . COPD Brother     Anti-infectives: Anti-infectives (From admission, onward)   None      Current Outpatient Medications  Medication Sig Dispense Refill  . cetirizine (ZYRTEC) 10 MG chewable tablet Chew 10 mg by mouth daily.    . Eszopiclone 3 MG TABS Take 1 tablet (3 mg total) by mouth at bedtime as needed. 90 tablet 1  . fluticasone (FLONASE) 50 MCG/ACT nasal spray Place into both nostrils daily.    . pantoprazole (PROTONIX) 40 MG tablet Take 1 tablet (40 mg total) by mouth daily. 90 tablet 3  . rosuvastatin (CRESTOR) 10 MG tablet Take 1 tablet (10 mg total) by mouth at bedtime. 90 tablet 3  . sulfamethoxazole-trimethoprim (BACTRIM DS) 800-160 MG tablet Take 1 tablet by mouth 2 (two) times daily.    . valACYclovir (VALTREX) 1000 MG tablet Take 1,000 mg by mouth 2 (two) times daily.    Marland Kitchen alfuzosin (UROXATRAL) 10 MG 24 hr tablet Take 1 tablet (10 mg total) by mouth daily with breakfast. 30 tablet 11   No current facility-administered medications for this visit.     Objective: Vital signs in last 24 hours: BP 135/66   Pulse 70   Temp 98 F (36.7 C)   Ht 5' 10.5" (1.791 m)   Wt 152 lb (68.9 kg)   BMI 21.50 kg/m   Intake/Output from previous day: No intake/output data recorded. Intake/Output this shift: @IOTHISSHIFT @   Physical Exam  Lab Results:  Results for orders placed or performed in visit on 07/25/20 (from the past 24 hour(s))  Urinalysis, Routine w reflex microscopic     Status: Abnormal   Collection Time: 07/25/20  2:46 PM  Result Value Ref Range   Specific Gravity, UA >1.030 (H) 1.005 - 1.030   pH, UA 5.5 5.0 - 7.5   Color, UA Yellow Yellow   Appearance Ur Clear Clear   Leukocytes,UA Negative Negative   Protein,UA Negative Negative/Trace  Glucose, UA  Negative Negative   Ketones, UA Negative Negative   RBC, UA Negative Negative   Bilirubin, UA Negative Negative   Urobilinogen, Ur 0.2 0.2 - 1.0 mg/dL   Nitrite, UA Negative Negative   Microscopic Examination Comment    Narrative   Performed at:  [facility] [address], [place], [place]  [id number] Lab Director: [place], Phone:  [phone]    BMET No results for input(s): NA, K, CL, CO2, GLUCOSE, BUN, CREATININE, CALCIUM in the last 72 hours. PT/INR No results for input(s): LABPROT, INR in the last 72 hours. ABG No results for input(s): PHART, HCO3 in the last 72 hours.  Invalid input(s): PCO2, PO2  Studies/Results: I reviewed his CT from 2008.   The testicle was in the canal on the left but it is down on exam today.    Assessment/Plan: BPH with BOO and Bladder stone.   The CT shows a 31mm bladder stone with bladder wall thickening consistent with bladder outlet obstruction.  He has severe LUTS.   I discussed cystolithalopaxy and also reviewed the options for managing the outlet obstruction including Medication,  TURP, possible TUIP, Urolift and Rezum.  At this time he would like to have the stone removed and try medication before commiting to a surgical approach to management.  I will send alfuzosin and reviewed the side effects.  I also reviewed the risks and benefits of the TURP in detail as well as the risk of the cystolithalopaxy including bleeding, infection, injury to the urinary tract with stricturing, difficulty voiding and anesthetic complications.    Elevated PSA.  His PSA is 3.8 so I will do a prostate [place] and biopsy at the time of the cystolithalopaxy as a prostate cancer would impact possible treatment options for the outlet obstruction.   Risks of bleeding, infection and voiding difficulty reviewed.        Meds ordered this encounter  Medications  . alfuzosin (UROXATRAL) 10 MG 24 hr tablet    Sig: Take 1 tablet (10 mg total) by mouth daily  with breakfast.    Dispense:  30 tablet    Refill:  11     Orders Placed This Encounter  Procedures  . Urinalysis, Routine w reflex microscopic     Return for Next available schedule surgery. .    CC: Dr. Adam Phenix.      Irine Seal 07/25/2020 [phone]

## 2020-07-25 ENCOUNTER — Ambulatory Visit (INDEPENDENT_AMBULATORY_CARE_PROVIDER_SITE_OTHER): Payer: 59 | Admitting: Urology

## 2020-07-25 ENCOUNTER — Encounter: Payer: Self-pay | Admitting: Urology

## 2020-07-25 ENCOUNTER — Other Ambulatory Visit: Payer: Self-pay

## 2020-07-25 VITALS — BP 135/66 | HR 70 | Temp 98.0°F | Ht 70.5 in | Wt 152.0 lb

## 2020-07-25 DIAGNOSIS — R3915 Urgency of urination: Secondary | ICD-10-CM | POA: Diagnosis not present

## 2020-07-25 DIAGNOSIS — N401 Enlarged prostate with lower urinary tract symptoms: Secondary | ICD-10-CM

## 2020-07-25 DIAGNOSIS — R310 Gross hematuria: Secondary | ICD-10-CM

## 2020-07-25 DIAGNOSIS — R3912 Poor urinary stream: Secondary | ICD-10-CM

## 2020-07-25 DIAGNOSIS — N210 Calculus in bladder: Secondary | ICD-10-CM

## 2020-07-25 DIAGNOSIS — N138 Other obstructive and reflux uropathy: Secondary | ICD-10-CM

## 2020-07-25 LAB — URINALYSIS, ROUTINE W REFLEX MICROSCOPIC
Bilirubin, UA: NEGATIVE
Glucose, UA: NEGATIVE
Ketones, UA: NEGATIVE
Leukocytes,UA: NEGATIVE
Nitrite, UA: NEGATIVE
Protein,UA: NEGATIVE
RBC, UA: NEGATIVE
Specific Gravity, UA: >1.03 — ABNORMAL HIGH (ref 1.005–1.030)
Urobilinogen, Ur: 0.2 mg/dL (ref 0.2–1.0)
pH, UA: 5.5 (ref 5.0–7.5)

## 2020-07-25 MED ORDER — ALFUZOSIN HCL ER 10 MG PO TB24: 10.0000 mg | ORAL_TABLET | Freq: Every day | ORAL | 11 refills | Status: DC

## 2020-07-25 NOTE — Progress Notes (Signed)
Urological Symptom Review  Patient is experiencing the following symptoms: Frequent urination Hard to postpone urination Get up at night to urinate Stream starts and stops Trouble starting stream Blood in urine  Bladder stones   Review of Systems  Gastrointestinal (upper)  : Negative for upper GI symptoms  Gastrointestinal (lower) : Negative for lower GI symptoms  Constitutional : Negative for symptoms  Skin: Negative for skin symptoms  Eyes: Negative for eye symptoms  Ear/Nose/Throat : Negative for Ear/Nose/Throat symptoms  Hematologic/Lymphatic: Negative for Hematologic/Lymphatic symptoms  Cardiovascular : Negative for cardiovascular symptoms  Respiratory : Negative for respiratory symptoms  Endocrine: Negative for endocrine symptoms  Musculoskeletal: Negative for musculoskeletal symptoms  Neurological: Negative for neurological symptoms  Psychologic: Negative for psychiatric symptoms

## 2020-07-29 ENCOUNTER — Encounter: Payer: Self-pay | Admitting: Urology

## 2020-07-29 ENCOUNTER — Telehealth: Payer: Self-pay | Admitting: Urology

## 2020-07-29 NOTE — Telephone Encounter (Signed)
Pt called wanting to know about getting his surgery scheduled. Please call him when you get a chance. Thanks!

## 2020-07-30 NOTE — Telephone Encounter (Signed)
See mychart messages

## 2020-08-05 ENCOUNTER — Other Ambulatory Visit (HOSPITAL_COMMUNITY): Payer: Self-pay | Admitting: Urology

## 2020-08-05 ENCOUNTER — Telehealth: Payer: Self-pay | Admitting: Urology

## 2020-08-05 ENCOUNTER — Other Ambulatory Visit: Payer: Self-pay | Admitting: Urology

## 2020-08-05 DIAGNOSIS — R972 Elevated prostate specific antigen [PSA]: Secondary | ICD-10-CM

## 2020-08-05 NOTE — Telephone Encounter (Signed)
You are doing a cystoscopy with litholapaxy on 10/7. He will need a 1-3wk f/u. Ok to double book for 10/15 or 10/29? You are out of town the week of the 22nd.

## 2020-08-05 NOTE — Telephone Encounter (Signed)
Patient called and asked for a nurse to return his call regarding his upcoming procedure. He also states he needs post op appt 1-3 weeks after his procedure. Dr Jeffie Pollock is booked, if you dont mind seeing when we could get this pt in.

## 2020-08-05 NOTE — Telephone Encounter (Signed)
Let's shoot for 10/29

## 2020-08-08 ENCOUNTER — Encounter (HOSPITAL_BASED_OUTPATIENT_CLINIC_OR_DEPARTMENT_OTHER): Payer: Self-pay | Admitting: Urology

## 2020-08-08 ENCOUNTER — Other Ambulatory Visit: Payer: Self-pay

## 2020-08-08 NOTE — Progress Notes (Signed)
Spoke w/ via phone for pre-op interview---pt Lab needs dos----I stat 8 (gent)               Lab results------none COVID test ------08-11-20 855 am Arrive at -------530 am 08-14-20 NPO after MN NO Solid Food.  Clear liquids from MN until---430 am then npo Medications to take morning of surgery -----certrizine, flonase, alfuzosin, pantaprazole Diabetic medication -----n/a Patient Special Instructions -----follow fleets enema instructions from dr Jeffie Pollock Pre-Op special Istructions -----none Patient verbalized understanding of instructions that were given at this phone interview. Patient denies shortness of breath, chest pain, fever, cough at this phone interview.

## 2020-08-11 ENCOUNTER — Other Ambulatory Visit (HOSPITAL_COMMUNITY)
Admission: RE | Admit: 2020-08-11 | Discharge: 2020-08-11 | Disposition: A | Discharge status: Home / Self Care | Payer: 59 | Source: Ambulatory Visit | Attending: Urology | Admitting: Urology

## 2020-08-11 DIAGNOSIS — Z01812 Encounter for preprocedural laboratory examination: Secondary | ICD-10-CM | POA: Diagnosis present

## 2020-08-11 DIAGNOSIS — Z20822 Contact with and (suspected) exposure to covid-19: Secondary | ICD-10-CM | POA: Diagnosis not present

## 2020-08-11 LAB — SARS CORONAVIRUS 2 (TAT 6-24 HRS): SARS Coronavirus 2: NEGATIVE

## 2020-08-13 NOTE — Anesthesia Preprocedure Evaluation (Addendum)
Anesthesia Evaluation  Patient identified by MRN, date of birth, ID band Patient awake    Reviewed: Allergy & Precautions, NPO status , Patient's Chart, lab work & pertinent test results  History of Anesthesia Complications Negative for: history of anesthetic complications  Airway Mallampati: I  TM Distance: >3 FB Neck ROM: Full    Dental  (+) Dental Advisory Given, Teeth Intact   Pulmonary sleep apnea (mouthguard) ,    Pulmonary exam normal        Cardiovascular negative cardio ROS Normal cardiovascular exam     Neuro/Psych PSYCHIATRIC DISORDERS Anxiety Depression negative neurological ROS     GI/Hepatic Neg liver ROS, GERD  Medicated and Controlled,  Endo/Other  negative endocrine ROS  Renal/GU negative Renal ROS     Musculoskeletal negative musculoskeletal ROS (+)   Abdominal   Peds  Hematology negative hematology ROS (+)   Anesthesia Other Findings Covid test negative   Reproductive/Obstetrics                            Anesthesia Physical Anesthesia Plan  ASA: II  Anesthesia Plan: General   Post-op Pain Management:    Induction: Intravenous  PONV Risk Score and Plan: 2 and Treatment may vary due to age or medical condition, Ondansetron, Dexamethasone and Midazolam  Airway Management Planned: LMA  Additional Equipment: None  Intra-op Plan:   Post-operative Plan: Extubation in OR  Informed Consent: I have reviewed the patients History and Physical, chart, labs and discussed the procedure including the risks, benefits and alternatives for the proposed anesthesia with the patient or authorized representative who has indicated his/her understanding and acceptance.     Dental advisory given  Plan Discussed with: CRNA and Anesthesiologist  Anesthesia Plan Comments:        Anesthesia Quick Evaluation

## 2020-08-14 ENCOUNTER — Ambulatory Visit (HOSPITAL_BASED_OUTPATIENT_CLINIC_OR_DEPARTMENT_OTHER)
Admission: RE | Admit: 2020-08-14 | Discharge: 2020-08-14 | Disposition: A | Discharge status: Home / Self Care | Payer: 59 | Source: Ambulatory Visit | Attending: Urology | Admitting: Urology

## 2020-08-14 ENCOUNTER — Encounter (HOSPITAL_BASED_OUTPATIENT_CLINIC_OR_DEPARTMENT_OTHER): Payer: Self-pay | Admitting: Urology

## 2020-08-14 ENCOUNTER — Ambulatory Visit (HOSPITAL_BASED_OUTPATIENT_CLINIC_OR_DEPARTMENT_OTHER): Payer: 59 | Admitting: Anesthesiology

## 2020-08-14 ENCOUNTER — Ambulatory Visit (HOSPITAL_COMMUNITY)
Admission: RE | Admit: 2020-08-14 | Discharge: 2020-08-14 | Disposition: A | Discharge status: Home / Self Care | Payer: 59 | Source: Ambulatory Visit | Attending: Urology | Admitting: Urology

## 2020-08-14 ENCOUNTER — Encounter (HOSPITAL_BASED_OUTPATIENT_CLINIC_OR_DEPARTMENT_OTHER)
Admission: RE | Disposition: A | Discharge status: Home / Self Care | Payer: Self-pay | Source: Ambulatory Visit | Attending: Urology

## 2020-08-14 DIAGNOSIS — Z79899 Other long term (current) drug therapy: Secondary | ICD-10-CM | POA: Insufficient documentation

## 2020-08-14 DIAGNOSIS — R972 Elevated prostate specific antigen [PSA]: Secondary | ICD-10-CM | POA: Insufficient documentation

## 2020-08-14 DIAGNOSIS — E785 Hyperlipidemia, unspecified: Secondary | ICD-10-CM | POA: Insufficient documentation

## 2020-08-14 DIAGNOSIS — N210 Calculus in bladder: Secondary | ICD-10-CM | POA: Diagnosis not present

## 2020-08-14 DIAGNOSIS — K219 Gastro-esophageal reflux disease without esophagitis: Secondary | ICD-10-CM | POA: Diagnosis not present

## 2020-08-14 DIAGNOSIS — F419 Anxiety disorder, unspecified: Secondary | ICD-10-CM | POA: Diagnosis not present

## 2020-08-14 DIAGNOSIS — F32A Depression, unspecified: Secondary | ICD-10-CM | POA: Insufficient documentation

## 2020-08-14 DIAGNOSIS — G4730 Sleep apnea, unspecified: Secondary | ICD-10-CM | POA: Diagnosis not present

## 2020-08-14 HISTORY — PX: CYSTOSCOPY WITH LITHOLAPAXY: SHX1425

## 2020-08-14 HISTORY — DX: Elevated prostate specific antigen (PSA): R97.20

## 2020-08-14 HISTORY — PX: PROSTATE BIOPSY: SHX241

## 2020-08-14 LAB — POCT I-STAT, CHEM 8
BUN: 11 mg/dL (ref 6–20)
Calcium, Ion: 1.31 mmol/L (ref 1.15–1.40)
Chloride: 98 mmol/L (ref 98–111)
Creatinine, Ser: 0.8 mg/dL (ref 0.61–1.24)
Glucose, Bld: 101 mg/dL — ABNORMAL HIGH (ref 70–99)
HCT: 38 % — ABNORMAL LOW (ref 39.0–52.0)
Hemoglobin: 12.9 g/dL — ABNORMAL LOW (ref 13.0–17.0)
Potassium: 4 mmol/L (ref 3.5–5.1)
Sodium: 140 mmol/L (ref 135–145)
TCO2: 28 mmol/L (ref 22–32)

## 2020-08-14 SURGERY — CYSTOSCOPY, WITH BLADDER CALCULUS LITHOLAPAXY
Anesthesia: General | Site: Prostate

## 2020-08-14 MED ORDER — FENTANYL CITRATE (PF) 100 MCG/2ML IJ SOLN: 25.0000 ug | INTRAMUSCULAR | Status: DC | PRN

## 2020-08-14 MED ORDER — LIDOCAINE 2% (20 MG/ML) 5 ML SYRINGE
INTRAMUSCULAR | Status: AC
  Filled 2020-08-14: qty 5

## 2020-08-14 MED ORDER — LIDOCAINE 2% (20 MG/ML) 5 ML SYRINGE
INTRAMUSCULAR | Status: DC | PRN
  Administered 2020-08-14: 60 mg via INTRAVENOUS

## 2020-08-14 MED ORDER — OXYCODONE HCL 5 MG/5ML PO SOLN: 5.0000 mg | Freq: Once | ORAL | Status: DC | PRN

## 2020-08-14 MED ORDER — CEFTRIAXONE SODIUM 2 G IJ SOLR
INTRAMUSCULAR | Status: AC
  Filled 2020-08-14: qty 20

## 2020-08-14 MED ORDER — PROMETHAZINE HCL 25 MG/ML IJ SOLN: 6.2500 mg | INTRAMUSCULAR | Status: DC | PRN

## 2020-08-14 MED ORDER — GENTAMICIN SULFATE 40 MG/ML IJ SOLN
5.0000 mg/kg | INTRAVENOUS | Status: AC
  Administered 2020-08-14: 350 mg via INTRAVENOUS
  Filled 2020-08-14: qty 8.75

## 2020-08-14 MED ORDER — SODIUM CHLORIDE 0.9% FLUSH: 3.0000 mL | Freq: Two times a day (BID) | INTRAVENOUS | Status: DC

## 2020-08-14 MED ORDER — MIDAZOLAM HCL 5 MG/5ML IJ SOLN
INTRAMUSCULAR | Status: DC | PRN
  Administered 2020-08-14: 2 mg via INTRAVENOUS

## 2020-08-14 MED ORDER — SODIUM CHLORIDE 0.9 % IV SOLN: INTRAVENOUS | Status: DC

## 2020-08-14 MED ORDER — MIDAZOLAM HCL 2 MG/2ML IJ SOLN
INTRAMUSCULAR | Status: AC
  Filled 2020-08-14: qty 2

## 2020-08-14 MED ORDER — FENTANYL CITRATE (PF) 100 MCG/2ML IJ SOLN
INTRAMUSCULAR | Status: DC | PRN
Start: 2020-08-14 — End: 2020-08-14
  Administered 2020-08-14: 50 ug via INTRAVENOUS

## 2020-08-14 MED ORDER — STERILE WATER FOR IRRIGATION IR SOLN
Status: DC | PRN
  Administered 2020-08-14: 3000 mL

## 2020-08-14 MED ORDER — SODIUM CHLORIDE 0.9 % IV SOLN
2.0000 g | INTRAVENOUS | Status: AC
  Administered 2020-08-14: 2 g via INTRAVENOUS

## 2020-08-14 MED ORDER — PROPOFOL 10 MG/ML IV BOLUS
INTRAVENOUS | Status: AC
  Filled 2020-08-14: qty 40

## 2020-08-14 MED ORDER — OXYCODONE HCL 5 MG PO TABS: 5.0000 mg | ORAL_TABLET | Freq: Once | ORAL | Status: DC | PRN

## 2020-08-14 MED ORDER — PROPOFOL 10 MG/ML IV BOLUS
INTRAVENOUS | Status: DC | PRN
  Administered 2020-08-14: 200 mg via INTRAVENOUS

## 2020-08-14 MED ORDER — FENTANYL CITRATE (PF) 100 MCG/2ML IJ SOLN
INTRAMUSCULAR | Status: AC
  Filled 2020-08-14: qty 2

## 2020-08-14 MED ORDER — ONDANSETRON HCL 4 MG/2ML IJ SOLN
INTRAMUSCULAR | Status: AC
  Filled 2020-08-14: qty 2

## 2020-08-14 MED ORDER — ONDANSETRON HCL 4 MG/2ML IJ SOLN
INTRAMUSCULAR | Status: DC | PRN
  Administered 2020-08-14: 4 mg via INTRAVENOUS

## 2020-08-14 MED ORDER — DEXAMETHASONE SODIUM PHOSPHATE 10 MG/ML IJ SOLN
INTRAMUSCULAR | Status: DC | PRN
  Administered 2020-08-14: 10 mg via INTRAVENOUS

## 2020-08-14 MED ORDER — SODIUM CHLORIDE 0.9 % IV SOLN
INTRAVENOUS | Status: AC
  Filled 2020-08-14: qty 100

## 2020-08-14 MED ORDER — DEXAMETHASONE SODIUM PHOSPHATE 10 MG/ML IJ SOLN
INTRAMUSCULAR | Status: AC
  Filled 2020-08-14: qty 1

## 2020-08-14 SURGICAL SUPPLY — 25 items
BAG DRAIN URO-CYSTO SKYTR STRL (DRAIN) ×4 IMPLANT
BAG DRN UROCATH (DRAIN) ×2
CLOTH BEACON ORANGE TIMEOUT ST (SAFETY) ×4 IMPLANT
FIBER LASER FLEXIVA 1000 (UROLOGICAL SUPPLIES) IMPLANT
FIBER LASER FLEXIVA 365 (UROLOGICAL SUPPLIES) IMPLANT
FIBER LASER FLEXIVA 550 (UROLOGICAL SUPPLIES) IMPLANT
FIBER LASER TRAC TIP (UROLOGICAL SUPPLIES) IMPLANT
GLOVE SURG SS PI 8.0 STRL IVOR (GLOVE) ×4 IMPLANT
GOWN STRL REUS W/TWL XL LVL3 (GOWN DISPOSABLE) ×4 IMPLANT
INST BIOPSY MAXCORE 18GX25 (NEEDLE) ×4 IMPLANT
INSTR BIOPSY MAXCORE 18GX20 (NEEDLE) IMPLANT
KIT TURNOVER CYSTO (KITS) ×4 IMPLANT
MANIFOLD NEPTUNE II (INSTRUMENTS) ×4 IMPLANT
NDL SAFETY ECLIPSE 18X1.5 (NEEDLE) IMPLANT
NEEDLE HYPO 18GX1.5 SHARP (NEEDLE)
NEEDLE SPNL 22GX7 QUINCKE BK (NEEDLE) IMPLANT
PACK CYSTO (CUSTOM PROCEDURE TRAY) ×4 IMPLANT
SURGILUBE 2OZ TUBE FLIPTOP (MISCELLANEOUS) ×4 IMPLANT
SYR CONTROL 10ML LL (SYRINGE) IMPLANT
TOWEL OR 17X26 10 PK STRL BLUE (TOWEL DISPOSABLE) ×4 IMPLANT
TUBE CONNECTING 12'X1/4 (SUCTIONS)
TUBE CONNECTING 12X1/4 (SUCTIONS) IMPLANT
UNDERPAD 30X36 HEAVY ABSORB (UNDERPADS AND DIAPERS) IMPLANT
WATER STERILE IRR 3000ML UROMA (IV SOLUTION) ×4 IMPLANT
WATER STERILE IRR 500ML POUR (IV SOLUTION) IMPLANT

## 2020-08-14 NOTE — Anesthesia Procedure Notes (Signed)
Procedure Name: LMA Insertion Date/Time: 08/14/2020 7:29 AM Performed by: Bonney Aid, CRNA Pre-anesthesia Checklist: Patient identified, Emergency Drugs available, Suction available and Patient being monitored Patient Re-evaluated:Patient Re-evaluated prior to induction Oxygen Delivery Method: Circle system utilized Preoxygenation: Pre-oxygenation with 100% oxygen Induction Type: IV induction Ventilation: Mask ventilation without difficulty LMA: LMA inserted LMA Size: 4.0 Number of attempts: 1 Airway Equipment and Method: Bite block Placement Confirmation: positive ETCO2 Tube secured with: Tape Dental Injury: Teeth and Oropharynx as per pre-operative assessment

## 2020-08-14 NOTE — Op Note (Signed)
Procedure: 1.  Prostate ultrasound. 2.  Prostate ultrasound-guided needle biopsy. 3.  Cystoscopy with removal of 7 mm bladder stone.  Preop diagnosis: Elevated PSA and 7 mm bladder stone.  Postop diagnosis: Same.  Surgeon: Dr. Irine Seal.  Anesthesia: General.  Specimen: 12 core prostate biopsy and 7 mm bladder stone.  Drain: None.  EBL: None.  Complications: None.  Indications: The patient is a 59 year old male with a history of BPH with bladder outlet obstruction and a 7 mm bladder stone who is a elected to undergo bladder stone removal with medical therapy of his BPH.  He also has an elevated PSA of 3.8 in the stone ago prostate ultrasound and biopsies.  Procedure: He was given Rocephin and gentamicin.  Was taken operating room where general anesthetic was induced.  He was placed in lithotomy position and fitted with PAS hose.  A transrectal ultrasound of the prostate was then performed using the 10 MHz probe.  Examination revealed normal seminal vesicles.  The prostate volume was 28 mL.  There were central calcifications in the mid transitional zone and at the apex on the left.  The peripheral zone was well-defined and otherwise unremarkable.  There was a slight hypoechoic lesion in the right apical transitional zone.  After completion of the diagnostic scan 12 needle biopsies were obtained in the usual configuration but care was taken to target the hypoechoic lesion in the right apical area.  There was minimal bleeding associated with the biopsy.  The patient was then prepped with Betadine solution and draped in usual sterile fashion.  Cystoscopy was then performed using a 23 [place] scope and 30 degree lens.  Examination revealed a normal urethra.  The external sphincter was intact.  The prostatic urethra short with some lateral lobe hyperplasia and elevation of the bladder neck.  A very small middle lobe was identified.  There were a few small clots in the bladder from the prostate  biopsy and these were evacuated.  Inspection of bladder wall revealed normal mucosa.  There is moderate trabeculation with a few cellules.  Ureteral orifices were in the normal anatomic position.  There was a stone at the base the bladder consistent with his known bladder stone.  The stone was aspirated out through the scope and did not require fragmentation.  The bladder was then drained and the cystoscope was removed.  He was taken down from lithotomy position, his anesthetic was reversed and he was moved to recovery in stable condition.  There were no complications.

## 2020-08-14 NOTE — Anesthesia Postprocedure Evaluation (Signed)
Anesthesia Post Note  Patient: Samuel Powers  Procedure(s) Performed: CYSTOSCOPY WITH REMOVAL OF BLADDER STONE (N/A Bladder) BIOPSY TRANSRECTAL ULTRASONIC PROSTATE (TUBP) (N/A Prostate)     Patient location during evaluation: PACU Anesthesia Type: General Level of consciousness: awake and alert Pain management: pain level controlled Vital Signs Assessment: post-procedure vital signs reviewed and stable Respiratory status: spontaneous breathing, nonlabored ventilation and respiratory function stable Cardiovascular status: blood pressure returned to baseline and stable Postop Assessment: no apparent nausea or vomiting Anesthetic complications: no   No complications documented.  Last Vitals:  Vitals:   08/14/20 0830 08/14/20 0845  BP: 113/77 119/82  Pulse: (!) 56 (!) 56  Resp: 10 13  Temp:  36.5 C  SpO2: 100% 100%    Last Pain:  Vitals:   08/14/20 0807  TempSrc:   PainSc: 0-No pain                 Audry Pili

## 2020-08-14 NOTE — Interval H&P Note (Signed)
History and Physical Interval Note:  08/14/2020 7:07 AM  Samuel Powers  has presented today for surgery, with the diagnosis of BLADDER STONE ELEVATED PSA.  The various methods of treatment have been discussed with the patient and family. After consideration of risks, benefits and other options for treatment, the patient has consented to  Procedure(s): CYSTOSCOPY WITH LITHOLAPAXY (N/A) BIOPSY TRANSRECTAL ULTRASONIC PROSTATE (TUBP) (N/A) as a surgical intervention.  The patient's history has been reviewed, patient examined, no change in status, stable for surgery.  I have reviewed the patient's chart and labs.  Questions were answered to the patient's satisfaction.     Irine Seal

## 2020-08-14 NOTE — Transfer of Care (Signed)
Immediate Anesthesia Transfer of Care Note  Patient: Samuel Powers  Procedure(s) Performed: CYSTOSCOPY WITH REMOVAL OF BLADDER STONE (N/A Bladder) BIOPSY TRANSRECTAL ULTRASONIC PROSTATE (TUBP) (N/A Prostate)  Patient Location: PACU  Anesthesia Type:General  Level of Consciousness: awake, alert  and oriented  Airway & Oxygen Therapy: Patient Spontanous Breathing and Patient connected to nasal cannula oxygen  Post-op Assessment: Report given to RN  Post vital signs: Reviewed and stable  Last Vitals:  Vitals Value Taken Time  BP 119/85 08/14/20 0807  Temp    Pulse 67 08/14/20 0808  Resp 14 08/14/20 0808  SpO2 100 % 08/14/20 0808  Vitals shown include unvalidated device data.  Last Pain:  Vitals:   08/14/20 0554  TempSrc: Oral  PainSc: 2       Patients Stated Pain Goal: 5 (82/08/13 8871)  Complications: No complications documented.

## 2020-08-14 NOTE — Discharge Instructions (Addendum)
CYSTOSCOPY HOME CARE INSTRUCTIONS  Activity: Rest for the remainder of the day.  Do not drive or operate equipment today.  You may resume normal activities in one to two days as instructed by your physician.   Meals: Drink plenty of liquids and eat light foods such as gelatin or soup this evening.  You may return to a normal meal plan tomorrow.  Return to Work: You may return to work in one to two days or as instructed by your physician.  Special Instructions / Symptoms: Call your physician if any of these symptoms occur:   -persistent or heavy bleeding  -bleeding which continues after first few urination  -large blood clots that are difficult to pass  -urine stream diminishes or stops completely  -fever equal to or higher than 101 degrees Farenheit.  -cloudy urine with a strong, foul odor  -severe pain  Females should always wipe from front to back after elimination.  You may feel some burning pain when you urinate.  This should disappear with time.  Applying moist heat to the lower abdomen or a hot tub bath may help relieve the pain. \  You should use tylenol or ibuprofen for any postoperative discomfort.   Post Anesthesia Home Care Instructions  Activity: Get plenty of rest for the remainder of the day. A responsible individual must stay with you for 24 hours following the procedure.  For the next 24 hours, DO NOT: -Drive a car -Paediatric nurse -Drink alcoholic beverages -Take any medication unless instructed by your physician -Make any legal decisions or sign important papers.  Meals: Start with liquid foods such as gelatin or soup. Progress to regular foods as tolerated. Avoid greasy, spicy, heavy foods. If nausea and/or vomiting occur, drink only clear liquids until the nausea and/or vomiting subsides. Call your physician if vomiting continues.  Special Instructions/Symptoms: Your throat may feel dry or sore from the anesthesia or the breathing tube placed in your  throat during surgery. If this causes discomfort, gargle with warm salt water. The discomfort should disappear within 24 hours.

## 2020-08-15 ENCOUNTER — Encounter (HOSPITAL_BASED_OUTPATIENT_CLINIC_OR_DEPARTMENT_OTHER): Payer: Self-pay | Admitting: Urology

## 2020-08-15 ENCOUNTER — Telehealth: Payer: Self-pay

## 2020-08-15 LAB — SURGICAL PATHOLOGY

## 2020-08-15 NOTE — Telephone Encounter (Signed)
-----   Message from Irine Seal, MD sent at 08/15/2020  1:37 PM EDT ----- Please let him know that his prostate biopsy was negative.

## 2020-08-15 NOTE — Telephone Encounter (Signed)
Pt notified of prostate biopsy results.

## 2020-08-27 ENCOUNTER — Encounter: Payer: Self-pay | Admitting: Family Medicine

## 2020-09-04 NOTE — Progress Notes (Signed)
Subjective: 1. BPH with urinary obstruction   2. Bladder stone   3. Weak urinary stream   4. Elevated PSA      Samuel Powers is a 59 yo male who is sent by Dr. Lajuana Ripple for gross hematuria after a walk with >30 RBC's on a UA on 04/24/20.  His Cr was 0.92 on 04/09/20.  He had it again over 05/11/20.  He has not seen it since.  He has intermittent urgency with some intermittency or straining to void.   He subsequently has had some right flank pain.  His UA today is clear.   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  He has no other GU history or complaints.   07/25/20:  Samuel Powers returns today in f/u.   He continues to have some right flank pain that is more of a discomfort and he has some frequency and urgency with straining to void.  His PSA is 3.8.  He had a CT that showed a 68mm bladder stone.  With bladder wall thickening and trilobar hyperplasia.  His IPSS remains elevated at 29.  09/05/20: Samuel Powers returns today in f/u from recent cystoscopy with bladder stone removal.  He is doing well with improvement in the voiding symptoms.  He has an improved stream with reduced intermittency.  He has had no hematuria or dysuria.   His prostate biopsy was negative.  The prostate volume was 27ml.   He remains on alfuzosin.  His IPSS has declined to 6 from 29.   IPSS    Row Name 09/05/20 1000         International Prostate Symptom Score   How often have you had the sensation of not emptying your bladder? Less than 1 in 5     How often have you had to urinate less than every two hours? Less than 1 in 5 times     How often have you found you stopped and started again several times when you urinated? Not at All     How often have you found it difficult to postpone urination? Less than 1 in 5 times     How often have you had a weak urinary stream? Not at All     How often have you had to strain to start urination? Less than 1 in 5 times     How many times did  you typically get up at night to urinate? 2 Times     Total IPSS Score 6       Quality of Life due to urinary symptoms   If you were to spend the rest of your life with your urinary condition just the way it is now how would you feel about that? Pleased            ROS:  ROS  Allergies  Allergen Reactions  . Codeine     Weird dreams and light headed    Past Medical History:  Diagnosis Date  . Allergy   . Anxiety   . Depression   . Elevated PSA since june 2021  . GERD (gastroesophageal reflux disease)   . Hyperlipidemia   . Sleep apnea    wears mouth guard cpap not tolerated, moderate osa    Past Surgical History:  Procedure Laterality Date  . CYSTOSCOPY WITH LITHOLAPAXY N/A 08/14/2020   Procedure: CYSTOSCOPY WITH REMOVAL OF BLADDER STONE;  Surgeon: Irine Seal, MD;  Location: [facility];  Service: Urology;  Laterality: N/A;  . PROSTATE BIOPSY N/A 08/14/2020   Procedure: BIOPSY TRANSRECTAL ULTRASONIC PROSTATE (TUBP);  Surgeon: Irine Seal, MD;  Location: [facility];  Service: Urology;  Laterality: N/A;  . TONSILLECTOMY Bilateral 2000    Social History   Socioeconomic History  . Marital status: Single    Spouse name: Not on file  . Number of children: Not on file  . Years of education: Not on file  . Highest education level: Not on file  Occupational History  . Occupation: Engineer, maintenance (IT)  Tobacco Use  . Smoking status: Never Smoker  . Smokeless tobacco: Never Used  Vaping Use  . Vaping Use: Never used  Substance and Sexual Activity  . Alcohol use: Not Currently  . Drug use: Never  . Sexual activity: Not on file  Other Topics Concern  . Not on file  Social History Narrative   Recently relocated back home from [place] to be closer to his mother, who resides in [place].   Social Determinants of Health   Financial Resource Strain:   . Difficulty of Paying Living Expenses: Not on file  Food Insecurity:   . Worried About Paediatric nurse in the Last Year: Not on file  . Ran Out of Food in the Last Year: Not on file  Transportation Needs:   . Lack of Transportation (Medical): Not on file  . Lack of Transportation (Non-Medical): Not on file  Physical Activity:   . Days of Exercise per Week: Not on file  . Minutes of Exercise per Session: Not on file  Stress:   . Feeling of Stress : Not on file  Social Connections:   . Frequency of Communication with Friends and Family: Not on file  . Frequency of Social Gatherings with Friends and Family: Not on file  . Attends Religious Services: Not on file  . Active Member of Clubs or Organizations: Not on file  . Attends Archivist Meetings: Not on file  . Marital Status: Not on file  Intimate Partner Violence:   . Fear of Current or Ex-Partner: Not on file  . Emotionally Abused: Not on file  . Physically Abused: Not on file  . Sexually Abused: Not on file    Family History  Problem Relation Age of Onset  . COPD Mother   . Diabetes Mother   . Hypertension Mother   . Alcohol abuse Father   . COPD Brother     Anti-infectives: Anti-infectives (From admission, onward)   None      Current Outpatient Medications  Medication Sig Dispense Refill  . alfuzosin (UROXATRAL) 10 MG 24 hr tablet Take 1 tablet (10 mg total) by mouth daily with breakfast. 30 tablet 11  . Ascorbic Acid (VITAMIN C WITH ROSE HIPS) 500 MG tablet Take 500 mg by mouth daily.    . cetirizine (ZYRTEC) 10 MG chewable tablet Chew 10 mg by mouth daily.    . Eszopiclone 3 MG TABS Take 1 tablet (3 mg total) by mouth at bedtime as needed. (Patient taking differently: Take 3 mg by mouth at bedtime as needed. Generic lunesta) 90 tablet 1  . fluticasone (FLONASE) 50 MCG/ACT nasal spray Place into both nostrils daily.    . pantoprazole (PROTONIX) 40 MG tablet Take 1 tablet (40 mg total) by mouth daily. 90 tablet 3  . rosuvastatin (CRESTOR) 10 MG tablet Take 1 tablet (10 mg total) by mouth at  bedtime. 90 tablet 3  .  valACYclovir (VALTREX) 1000 MG tablet Take 1,000 mg by mouth 2 (two) times daily as needed.      No current facility-administered medications for this visit.     Objective: Vital signs in last 24 hours: BP (!) 142/76   Pulse 74   Temp 98 F (36.7 C)   Ht 5\' 10"  (1.778 m)   Wt 154 lb 12.8 oz (70.2 kg)   BMI 22.21 kg/m   Intake/Output from previous day: No intake/output data recorded. Intake/Output this shift: @IOTHISSHIFT @   Physical Exam  Lab Results:  UA is unremarkable.  Studies/Results: Path report reviewed.   All biopsies were BPH.     Assessment/Plan: BPH with BOO and Bladder stone.  He is doing well s/p stone removal and with the addition of alfuzosin.   He will bring the stone for analysis.   Elevated PSA.  His biopsy was negative.   I will repeat PSA in 6 months.         No orders of the defined types were placed in this encounter.    Orders Placed This Encounter  Procedures  . Urinalysis, Routine w reflex microscopic  . PSA, total and free    Standing Status:   Future    Standing Expiration Date:   09/05/2021  . Calculi, with Photograph (to Clinical Lab)    Standing Status:   Future    Standing Expiration Date:   10/06/2020     Return in about 6 months (around 03/06/2021).    CC: Dr. Adam Phenix.      Irine Seal 09/05/2020 [phone]

## 2020-09-05 ENCOUNTER — Encounter: Payer: Self-pay | Admitting: Urology

## 2020-09-05 ENCOUNTER — Ambulatory Visit (INDEPENDENT_AMBULATORY_CARE_PROVIDER_SITE_OTHER): Payer: 59 | Admitting: Urology

## 2020-09-05 ENCOUNTER — Other Ambulatory Visit: Payer: Self-pay

## 2020-09-05 VITALS — BP 142/76 | HR 74 | Temp 98.0°F | Ht 70.0 in | Wt 154.8 lb

## 2020-09-05 DIAGNOSIS — N210 Calculus in bladder: Secondary | ICD-10-CM

## 2020-09-05 DIAGNOSIS — R972 Elevated prostate specific antigen [PSA]: Secondary | ICD-10-CM

## 2020-09-05 DIAGNOSIS — R3912 Poor urinary stream: Secondary | ICD-10-CM

## 2020-09-05 DIAGNOSIS — N401 Enlarged prostate with lower urinary tract symptoms: Secondary | ICD-10-CM

## 2020-09-05 DIAGNOSIS — N138 Other obstructive and reflux uropathy: Secondary | ICD-10-CM

## 2020-09-05 LAB — URINALYSIS, ROUTINE W REFLEX MICROSCOPIC
Bilirubin, UA: NEGATIVE
Glucose, UA: NEGATIVE
Ketones, UA: NEGATIVE
Leukocytes,UA: NEGATIVE
Nitrite, UA: NEGATIVE
Protein,UA: NEGATIVE
RBC, UA: NEGATIVE
Specific Gravity, UA: 1.025 (ref 1.005–1.030)
Urobilinogen, Ur: 0.2 mg/dL (ref 0.2–1.0)
pH, UA: 7.5 (ref 5.0–7.5)

## 2020-09-05 LAB — MICROSCOPIC EXAMINATION
Bacteria, UA: NONE SEEN
RBC, Urine: NONE SEEN /hpf (ref 0–2)
WBC, UA: NONE SEEN /hpf (ref 0–5)

## 2020-09-05 NOTE — Progress Notes (Signed)

## 2020-09-09 ENCOUNTER — Ambulatory Visit (INDEPENDENT_AMBULATORY_CARE_PROVIDER_SITE_OTHER): Payer: 59

## 2020-09-09 ENCOUNTER — Other Ambulatory Visit: Payer: Self-pay

## 2020-09-09 DIAGNOSIS — Z23 Encounter for immunization: Secondary | ICD-10-CM

## 2020-09-12 ENCOUNTER — Other Ambulatory Visit: Payer: Self-pay

## 2020-09-12 DIAGNOSIS — N210 Calculus in bladder: Secondary | ICD-10-CM

## 2020-09-17 LAB — CALCULI, WITH PHOTOGRAPH (CLINICAL LAB)
Calcium Oxalate Dihydrate: 90 %
Calcium Oxalate Monohydrate: 10 %
Weight Calculi: 107 mg

## 2020-09-17 LAB — SPECIMEN STATUS REPORT

## 2020-09-17 NOTE — Progress Notes (Signed)
Results sent via my chart 

## 2020-10-01 ENCOUNTER — Other Ambulatory Visit: Payer: Self-pay | Admitting: Family Medicine

## 2020-10-01 DIAGNOSIS — F5101 Primary insomnia: Secondary | ICD-10-CM

## 2020-10-09 ENCOUNTER — Ambulatory Visit (INDEPENDENT_AMBULATORY_CARE_PROVIDER_SITE_OTHER): Payer: 59 | Admitting: Podiatry

## 2020-10-09 ENCOUNTER — Encounter: Payer: Self-pay | Admitting: Podiatry

## 2020-10-09 ENCOUNTER — Other Ambulatory Visit: Payer: Self-pay

## 2020-10-09 DIAGNOSIS — L600 Ingrowing nail: Secondary | ICD-10-CM | POA: Diagnosis not present

## 2020-10-09 DIAGNOSIS — B351 Tinea unguium: Secondary | ICD-10-CM

## 2020-10-09 DIAGNOSIS — Z79899 Other long term (current) drug therapy: Secondary | ICD-10-CM | POA: Diagnosis not present

## 2020-10-09 NOTE — Patient Instructions (Signed)
Terbinafine oral granules What is this medicine? TERBINAFINE (TER bin a feen) is an antifungal medicine. It is used to treat certain kinds of fungal or yeast infections. This medicine may be used for other purposes; ask your health care provider or pharmacist if you have questions. COMMON BRAND NAME(S): Lamisil What should I tell my health care provider before I take this medicine? They need to know if you have any of these conditions:  drink alcoholic beverages  kidney disease  liver disease  an unusual or allergic reaction to Terbinafine, other medicines, foods, dyes, or preservatives  pregnant or trying to get pregnant  breast-feeding How should I use this medicine? Take this medicine by mouth. Follow the directions on the prescription label. Hold packet with cut line on top. Shake packet gently to settle contents. Tear packet open along cut line, or use scissors to cut across line. Carefully pour the entire contents of packet onto a spoonful of a soft food, such as pudding or other soft, non-acidic food such as mashed potatoes (do NOT use applesauce or a fruit-based food). If two packets are required for each dose, you may either sprinkle the content of both packets on one spoonful of non-acidic food, or sprinkle the contents of both packets on two spoonfuls of non-acidic food. Make sure that no granules remain in the packet. Swallow the mxiture of the food and granules without chewing. Take your medicine at regular intervals. Do not take it more often than directed. Take all of your medicine as directed even if you think you are better. Do not skip doses or stop your medicine early. Contact your pediatrician or health care professional regarding the use of this medicine in children. While this medicine may be prescribed for children as young as 4 years for selected conditions, precautions do apply. Overdosage: If you think you have taken too much of this medicine contact a poison control  center or emergency room at once. NOTE: This medicine is only for you. Do not share this medicine with others. What if I miss a dose? If you miss a dose, take it as soon as you can. If it is almost time for your next dose, take only that dose. Do not take double or extra doses. What may interact with this medicine? Do not take this medicine with any of the following medications:  thioridazine This medicine may also interact with the following medications:  beta-blockers  caffeine  cimetidine  cyclosporine  MAOIs like Carbex, Eldepryl, Marplan, Nardil, and Parnate  medicines for fungal infections like fluconazole and ketoconazole  medicines for irregular heartbeat like amiodarone, flecainide and propafenone  rifampin  SSRIs like citalopram, escitalopram, fluoxetine, fluvoxamine, paroxetine and sertraline  tricyclic antidepressants like amitriptyline, clomipramine, desipramine, imipramine, nortriptyline, and others  warfarin This list may not describe all possible interactions. Give your health care provider a list of all the medicines, herbs, non-prescription drugs, or dietary supplements you use. Also tell them if you smoke, drink alcohol, or use illegal drugs. Some items may interact with your medicine. What should I watch for while using this medicine? Your doctor may monitor your liver function. Tell your doctor right away if you have nausea or vomiting, loss of appetite, stomach pain on your right upper side, yellow skin, dark urine, light stools, or are over tired. This medicine may cause serious skin reactions. They can happen weeks to months after starting the medicine. Contact your health care provider right away if you notice fevers or flu-like symptoms   with a rash. The rash may be red or purple and then turn into blisters or peeling of the skin. Or, you might notice a red rash with swelling of the face, lips or lymph nodes in your neck or under your arms. You need to take  this medicine for 6 weeks or longer to cure the fungal infection. Take your medicine regularly for as long as your doctor or health care provider tells you to. What side effects may I notice from receiving this medicine? Side effects that you should report to your doctor or health care professional as soon as possible:  allergic reactions like skin rash or hives, swelling of the face, lips, or tongue  change in vision  dark urine  fever or infection  general ill feeling or flu-like symptoms  light-colored stools  loss of appetite, nausea  rash, fever, and swollen lymph nodes  redness, blistering, peeling or loosening of the skin, including inside the mouth  right upper belly pain  unusually weak or tired  yellowing of the eyes or skin Side effects that usually do not require medical attention (report to your doctor or health care professional if they continue or are bothersome):  changes in taste  diarrhea  hair loss  muscle or joint pain  stomach upset This list may not describe all possible side effects. Call your doctor for medical advice about side effects. You may report side effects to FDA at 1-800-FDA-1088. Where should I keep my medicine? Keep out of the reach of children. Store at room temperature between 15 and 30 degrees C (59 and 86 degrees F). Throw away any unused medicine after the expiration date. NOTE: This sheet is a summary. It may not cover all possible information. If you have questions about this medicine, talk to your doctor, pharmacist, or health care provider.  2020 Elsevier/Gold Standard (2019-02-02 15:35:11)  

## 2020-10-10 ENCOUNTER — Other Ambulatory Visit: Payer: Self-pay | Admitting: Podiatry

## 2020-10-10 ENCOUNTER — Ambulatory Visit: Payer: 59 | Admitting: Family Medicine

## 2020-10-10 LAB — CBC WITH DIFFERENTIAL/PLATELET
Absolute Monocytes: 328 cells/uL (ref 200–950)
Basophils Absolute: 62 cells/uL (ref 0–200)
Basophils Relative: 1.5 %
Eosinophils Absolute: 29 cells/uL (ref 15–500)
Eosinophils Relative: 0.7 %
HCT: 38.9 % (ref 38.5–50.0)
Hemoglobin: 13.6 g/dL (ref 13.2–17.1)
Lymphs Abs: 1169 cells/uL (ref 850–3900)
MCH: 31 pg (ref 27.0–33.0)
MCHC: 35 g/dL (ref 32.0–36.0)
MCV: 88.6 fL (ref 80.0–100.0)
MPV: 9.6 fL (ref 7.5–12.5)
Monocytes Relative: 8 %
Neutro Abs: 2513 cells/uL (ref 1500–7800)
Neutrophils Relative %: 61.3 %
Platelets: 265 10*3/uL (ref 140–400)
RBC: 4.39 10*6/uL (ref 4.20–5.80)
RDW: 12.2 % (ref 11.0–15.0)
Total Lymphocyte: 28.5 %
WBC: 4.1 10*3/uL (ref 3.8–10.8)

## 2020-10-10 LAB — HEPATIC FUNCTION PANEL
AG Ratio: 2.6 (calc) — ABNORMAL HIGH (ref 1.0–2.5)
ALT: 15 U/L (ref 9–46)
AST: 18 U/L (ref 10–35)
Albumin: 4.6 g/dL (ref 3.6–5.1)
Alkaline phosphatase (APISO): 48 U/L (ref 35–144)
Bilirubin, Direct: 0.2 mg/dL (ref 0.0–0.2)
Globulin: 1.8 g/dL (calc) — ABNORMAL LOW (ref 1.9–3.7)
Indirect Bilirubin: 0.6 mg/dL (calc) (ref 0.2–1.2)
Total Bilirubin: 0.8 mg/dL (ref 0.2–1.2)
Total Protein: 6.4 g/dL (ref 6.1–8.1)

## 2020-10-10 MED ORDER — TERBINAFINE HCL 250 MG PO TABS
250.0000 mg | ORAL_TABLET | Freq: Every day | ORAL | 0 refills | Status: DC
Start: 2020-10-10 — End: 2021-04-15

## 2020-10-13 ENCOUNTER — Other Ambulatory Visit: Payer: Self-pay

## 2020-10-13 ENCOUNTER — Encounter: Payer: Self-pay | Admitting: Family Medicine

## 2020-10-13 ENCOUNTER — Ambulatory Visit (INDEPENDENT_AMBULATORY_CARE_PROVIDER_SITE_OTHER): Payer: 59 | Admitting: Family Medicine

## 2020-10-13 VITALS — BP 112/66 | HR 67 | Temp 97.8°F | Ht 70.0 in | Wt 162.6 lb

## 2020-10-13 DIAGNOSIS — Z6379 Other stressful life events affecting family and household: Secondary | ICD-10-CM | POA: Diagnosis not present

## 2020-10-13 DIAGNOSIS — Z87448 Personal history of other diseases of urinary system: Secondary | ICD-10-CM | POA: Diagnosis not present

## 2020-10-13 DIAGNOSIS — N400 Enlarged prostate without lower urinary tract symptoms: Secondary | ICD-10-CM

## 2020-10-13 DIAGNOSIS — F5101 Primary insomnia: Secondary | ICD-10-CM

## 2020-10-13 DIAGNOSIS — F439 Reaction to severe stress, unspecified: Secondary | ICD-10-CM | POA: Insufficient documentation

## 2020-10-13 MED ORDER — ESZOPICLONE 3 MG PO TABS: 3.0000 mg | ORAL_TABLET | Freq: Every evening | ORAL | 1 refills | Status: DC | PRN

## 2020-10-13 NOTE — Patient Instructions (Signed)
Plan for fasting labs for next appointment, physical

## 2020-10-13 NOTE — Progress Notes (Signed)
Subjective: CC: Follow-up insomnia PCP: Janora Norlander, DO Samuel Powers is a 59 y.o. male presenting to clinic today for:  1.  Insomnia/history of bladder stone Patient reports fair sleep with the Lunesta 3 mg daily.  He uses this as needed.  He does report increased stress at home between his mother, who recently sustained a fall and his brother, who had a cancer scare recently.  He also has had some health issues since her last visit including a bladder stone and new diagnosis of enlarged prostate.  He is currently treated with Uroxatrol 10 mg daily and he finds that the urinary symptoms have all but resolved.  Unfortunately, over the last couple of days he has had some right-sided low back pain that was similar to when he had his previous bladder stone.  He has had no hematuria this time.  He denies any nausea, vomiting, dysuria, increased urinary frequency, decreased urine output.  He has not yet contacted his urologist, Dr. Jeffie Pollock.   ROS: Per HPI  Allergies  Allergen Reactions  . Codeine     Weird dreams and light headed   Past Medical History:  Diagnosis Date  . Allergy   . Anxiety   . Depression   . Elevated PSA since june 2021  . GERD (gastroesophageal reflux disease)   . Hyperlipidemia   . Sleep apnea    wears mouth guard cpap not tolerated, moderate osa    Current Outpatient Medications:  .  alfuzosin (UROXATRAL) 10 MG 24 hr tablet, Take 1 tablet (10 mg total) by mouth daily with breakfast., Disp: 30 tablet, Rfl: 11 .  Ascorbic Acid (VITAMIN C WITH ROSE HIPS) 500 MG tablet, Take 500 mg by mouth daily., Disp: , Rfl:  .  cetirizine (ZYRTEC) 10 MG chewable tablet, Chew 10 mg by mouth daily., Disp: , Rfl:  .  Eszopiclone 3 MG TABS, Take 1 tablet (3 mg total) by mouth at bedtime as needed. (Patient taking differently: Take 3 mg by mouth at bedtime as needed. Generic lunesta), Disp: 90 tablet, Rfl: 1 .  fluticasone (FLONASE) 50 MCG/ACT nasal spray, Place into both  nostrils daily., Disp: , Rfl:  .  pantoprazole (PROTONIX) 40 MG tablet, Take 1 tablet (40 mg total) by mouth daily., Disp: 90 tablet, Rfl: 3 .  rosuvastatin (CRESTOR) 10 MG tablet, Take 1 tablet (10 mg total) by mouth at bedtime., Disp: 90 tablet, Rfl: 3 .  terbinafine (LAMISIL) 250 MG tablet, Take 1 tablet (250 mg total) by mouth daily., Disp: 90 tablet, Rfl: 0 .  valACYclovir (VALTREX) 1000 MG tablet, Take 1,000 mg by mouth 2 (two) times daily as needed. , Disp: , Rfl:  Social History   Socioeconomic History  . Marital status: Single    Spouse name: Not on file  . Number of children: Not on file  . Years of education: Not on file  . Highest education level: Not on file  Occupational History  . Occupation: Engineer, maintenance (IT)  Tobacco Use  . Smoking status: Never Smoker  . Smokeless tobacco: Never Used  Vaping Use  . Vaping Use: Never used  Substance and Sexual Activity  . Alcohol use: Not Currently  . Drug use: Never  . Sexual activity: Not on file  Other Topics Concern  . Not on file  Social History Narrative   Recently relocated back home from [place] to be closer to his mother, who resides in [place].   Social Determinants of Health   Financial Resource Strain:   .  Difficulty of Paying Living Expenses: Not on file  Food Insecurity:   . Worried About Charity fundraiser in the Last Year: Not on file  . Ran Out of Food in the Last Year: Not on file  Transportation Needs:   . Lack of Transportation (Medical): Not on file  . Lack of Transportation (Non-Medical): Not on file  Physical Activity:   . Days of Exercise per Week: Not on file  . Minutes of Exercise per Session: Not on file  Stress:   . Feeling of Stress : Not on file  Social Connections:   . Frequency of Communication with Friends and Family: Not on file  . Frequency of Social Gatherings with Friends and Family: Not on file  . Attends Religious Services: Not on file  . Active Member of Clubs or Organizations: Not on  file  . Attends Archivist Meetings: Not on file  . Marital Status: Not on file  Intimate Partner Violence:   . Fear of Current or Ex-Partner: Not on file  . Emotionally Abused: Not on file  . Physically Abused: Not on file  . Sexually Abused: Not on file   Family History  Problem Relation Age of Onset  . COPD Mother   . Diabetes Mother   . Hypertension Mother   . Alcohol abuse Father   . COPD Brother     Objective: Office vital signs reviewed. BP 112/66   Pulse 67   Temp 97.8 F (36.6 C)   Ht 5\' 10"  (1.778 m)   Wt 162 lb 9.6 oz (73.8 kg)   SpO2 99%   BMI 23.33 kg/m   Physical Examination:  General: Awake, alert, well nourished, No acute distress HEENT: Normal, sclera white, MMM Cardio: regular rate and rhythm, S1S2 heard, no murmurs appreciated Pulm: clear to auscultation bilaterally, no wheezes, rhonchi or rales; normal work of breathing on room air GU: No suprapubic tenderness palpation.  No CVA tenderness to palpation.  Assessment/ Plan: 59 y.o. male   Primary insomnia - Plan: Eszopiclone 3 MG TABS  History of bladder stone  Benign prostatic hyperplasia without lower urinary tract symptoms  Stress due to illness of family member  Insomnia stable.  He has had some increased stress due to family member illnesses.  However, he does not wish to have any interventions at this time.  Concern for possible recurrent bladder stone given recurrent right-sided low back pain.  No true CVA tenderness to palpation.  No concerns for infection.  Offered urinalysis but he would like to hold off on this until he is able to seek care with his urologist.  He obviously understands red flag signs and symptoms warranting further evaluation.  He will continue his Uroxatrol for BPH.  No orders of the defined types were placed in this encounter.  No orders of the defined types were placed in this encounter.  The Narcotic Database has been reviewed.  There were no red  flags.     Janora Norlander, DO [place] [phone]

## 2020-10-13 NOTE — Progress Notes (Signed)
Subjective:   Patient ID: Samuel Powers, male   DOB: 59 y.o.   MRN: [id number]   HPI 59 year old male presents the office today for concerns of toenail fungus.  He has been using Kerydin for 2 years without any improvement.  He states that the nails are starting to get ingrown but did not cause any pain and he has not had any redness or drainage or any swelling.  He has no other concerns today.   Review of Systems  All other systems reviewed and are negative.  Past Medical History:  Diagnosis Date  . Allergy   . Anxiety   . Depression   . Elevated PSA since june 2021  . GERD (gastroesophageal reflux disease)   . Hyperlipidemia   . Sleep apnea    wears mouth guard cpap not tolerated, moderate osa    Past Surgical History:  Procedure Laterality Date  . CYSTOSCOPY WITH LITHOLAPAXY N/A 08/14/2020   Procedure: CYSTOSCOPY WITH REMOVAL OF BLADDER STONE;  Surgeon: Irine Seal, MD;  Location: [facility];  Service: Urology;  Laterality: N/A;  . PROSTATE BIOPSY N/A 08/14/2020   Procedure: BIOPSY TRANSRECTAL ULTRASONIC PROSTATE (TUBP);  Surgeon: Irine Seal, MD;  Location: [facility];  Service: Urology;  Laterality: N/A;  . TONSILLECTOMY Bilateral 2000     Current Outpatient Medications:  .  alfuzosin (UROXATRAL) 10 MG 24 hr tablet, Take 1 tablet (10 mg total) by mouth daily with breakfast., Disp: 30 tablet, Rfl: 11 .  Ascorbic Acid (VITAMIN C WITH ROSE HIPS) 500 MG tablet, Take 500 mg by mouth daily., Disp: , Rfl:  .  cetirizine (ZYRTEC) 10 MG chewable tablet, Chew 10 mg by mouth daily., Disp: , Rfl:  .  Eszopiclone 3 MG TABS, Take 1 tablet (3 mg total) by mouth at bedtime as needed. (Patient taking differently: Take 3 mg by mouth at bedtime as needed. Generic lunesta), Disp: 90 tablet, Rfl: 1 .  fluticasone (FLONASE) 50 MCG/ACT nasal spray, Place into both nostrils daily., Disp: , Rfl:  .  pantoprazole (PROTONIX) 40 MG tablet, Take 1 tablet (40 mg total) by  mouth daily., Disp: 90 tablet, Rfl: 3 .  rosuvastatin (CRESTOR) 10 MG tablet, Take 1 tablet (10 mg total) by mouth at bedtime., Disp: 90 tablet, Rfl: 3 .  terbinafine (LAMISIL) 250 MG tablet, Take 1 tablet (250 mg total) by mouth daily., Disp: 90 tablet, Rfl: 0 .  valACYclovir (VALTREX) 1000 MG tablet, Take 1,000 mg by mouth 2 (two) times daily as needed. , Disp: , Rfl:   Allergies  Allergen Reactions  . Codeine     Weird dreams and light headed         Objective:  Physical Exam  General: AAO x3, NAD  Dermatological: Nails appear to be hypertrophic, dystrophic with yellow-brown discoloration.  They are thickened.  No hyperpigmented changes.  There is no edema, erythema.  Mild incurvation present of the hallux toenails with any signs of infection today.  No pain in the nails.  Vascular: Dorsalis Pedis artery and Posterior Tibial artery pedal pulses are 2/4 bilateral with immedate capillary fill time. There is no pain with calf compression, swelling, warmth, erythema.   Neruologic: Grossly intact via light touch bilateral.   Musculoskeletal: No gross boney pedal deformities bilateral. No pain, crepitus, or limitation noted with foot and ankle range of motion bilateral. Muscular strength 5/5 in all groups tested bilateral.  Gait: Unassisted, Nonantalgic.       Assessment:  Onychomycosis with ingrown toenails    Plan:  -Treatment options discussed including all alternatives, risks, and complications -Etiology of symptoms were discussed -Discussed treatment options for nail fungus. Is received oral therapy understanding risks of the medication.  Will check a CBC and LFT prior to starting the medication. -Discussed ingrown toenails did not cause any pain.  Is a constant pain or infection discussed partial nail avulsions.  He wants to hold off on that at this point that they are not causing any problems.  Trula Slade DPM

## 2020-10-16 NOTE — Telephone Encounter (Signed)
Please advise. See previous message 

## 2020-11-04 ENCOUNTER — Encounter: Payer: Self-pay | Admitting: Family Medicine

## 2020-11-10 ENCOUNTER — Other Ambulatory Visit: Payer: 59

## 2020-11-10 DIAGNOSIS — Z20822 Contact with and (suspected) exposure to covid-19: Secondary | ICD-10-CM

## 2020-11-11 LAB — NOVEL CORONAVIRUS, NAA: SARS-CoV-2, NAA: NOT DETECTED

## 2020-11-11 LAB — SARS-COV-2, NAA 2 DAY TAT

## 2020-11-20 ENCOUNTER — Ambulatory Visit (INDEPENDENT_AMBULATORY_CARE_PROVIDER_SITE_OTHER): Payer: 59 | Admitting: Podiatry

## 2020-11-20 ENCOUNTER — Other Ambulatory Visit: Payer: Self-pay

## 2020-11-20 DIAGNOSIS — Z79899 Other long term (current) drug therapy: Secondary | ICD-10-CM | POA: Diagnosis not present

## 2020-11-20 DIAGNOSIS — B351 Tinea unguium: Secondary | ICD-10-CM

## 2020-11-20 NOTE — Progress Notes (Signed)
Subjective: 60 year old male presents the office today for follow-up evaluation of nail fungus, currently on Lamisil.  He states he is tolerating the medication well and he is already seeing improvement in his toenails.  He has no pain in the nails and denies any redness or drainage or any swelling. Denies any systemic complaints such as fevers, chills, nausea, vomiting. No acute changes since last appointment, and no other complaints at this time.   Objective: AAO x3, NAD DP/PT pulses palpable bilaterally, CRT less than 3 seconds There is clearing on the proximal aspect of the nails.  There is still discoloration of the distal one half.  There is no pain in the nail there is no redness or drainage or any signs of infection.  No open lesions.  No pain with calf compression, swelling, warmth, erythema  Assessment: Onychomycosis, currently on Lamisil  Plan: -All treatment options discussed with the patient including all alternatives, risks, complications.  -Overall doing well.  He is in the continue Lamisil.  Order placed to recheck CBC, LFT.  We will finish out the 90 days once this is complete discussed doing topical medication to left the nails to continue to grow but if needed we can always continue medication for an extra month but we will recheck a CBC and LFT prior to that -Patient encouraged to call the office with any questions, concerns, change in symptoms.   Trula Slade DPM

## 2020-11-20 NOTE — Patient Instructions (Signed)
Terbinafine oral granules What is this medicine? TERBINAFINE (TER bin a feen) is an antifungal medicine. It is used to treat certain kinds of fungal or yeast infections. This medicine may be used for other purposes; ask your health care provider or pharmacist if you have questions. COMMON BRAND NAME(S): Lamisil What should I tell my health care provider before I take this medicine? They need to know if you have any of these conditions:  drink alcoholic beverages  kidney disease  liver disease  an unusual or allergic reaction to Terbinafine, other medicines, foods, dyes, or preservatives  pregnant or trying to get pregnant  breast-feeding How should I use this medicine? Take this medicine by mouth. Follow the directions on the prescription label. Hold packet with cut line on top. Shake packet gently to settle contents. Tear packet open along cut line, or use scissors to cut across line. Carefully pour the entire contents of packet onto a spoonful of a soft food, such as pudding or other soft, non-acidic food such as mashed potatoes (do NOT use applesauce or a fruit-based food). If two packets are required for each dose, you may either sprinkle the content of both packets on one spoonful of non-acidic food, or sprinkle the contents of both packets on two spoonfuls of non-acidic food. Make sure that no granules remain in the packet. Swallow the mxiture of the food and granules without chewing. Take your medicine at regular intervals. Do not take it more often than directed. Take all of your medicine as directed even if you think you are better. Do not skip doses or stop your medicine early. Contact your pediatrician or health care professional regarding the use of this medicine in children. While this medicine may be prescribed for children as young as 4 years for selected conditions, precautions do apply. Overdosage: If you think you have taken too much of this medicine contact a poison control  center or emergency room at once. NOTE: This medicine is only for you. Do not share this medicine with others. What if I miss a dose? If you miss a dose, take it as soon as you can. If it is almost time for your next dose, take only that dose. Do not take double or extra doses. What may interact with this medicine? Do not take this medicine with any of the following medications:  thioridazine This medicine may also interact with the following medications:  beta-blockers  caffeine  cimetidine  cyclosporine  MAOIs like Carbex, Eldepryl, Marplan, Nardil, and Parnate  medicines for fungal infections like fluconazole and ketoconazole  medicines for irregular heartbeat like amiodarone, flecainide and propafenone  rifampin  SSRIs like citalopram, escitalopram, fluoxetine, fluvoxamine, paroxetine and sertraline  tricyclic antidepressants like amitriptyline, clomipramine, desipramine, imipramine, nortriptyline, and others  warfarin This list may not describe all possible interactions. Give your health care provider a list of all the medicines, herbs, non-prescription drugs, or dietary supplements you use. Also tell them if you smoke, drink alcohol, or use illegal drugs. Some items may interact with your medicine. What should I watch for while using this medicine? Your doctor may monitor your liver function. Tell your doctor right away if you have nausea or vomiting, loss of appetite, stomach pain on your right upper side, yellow skin, dark urine, light stools, or are over tired. This medicine may cause serious skin reactions. They can happen weeks to months after starting the medicine. Contact your health care provider right away if you notice fevers or flu-like symptoms   with a rash. The rash may be red or purple and then turn into blisters or peeling of the skin. Or, you might notice a red rash with swelling of the face, lips or lymph nodes in your neck or under your arms. You need to take  this medicine for 6 weeks or longer to cure the fungal infection. Take your medicine regularly for as long as your doctor or health care provider tells you to. What side effects may I notice from receiving this medicine? Side effects that you should report to your doctor or health care professional as soon as possible:  allergic reactions like skin rash or hives, swelling of the face, lips, or tongue  change in vision  dark urine  fever or infection  general ill feeling or flu-like symptoms  light-colored stools  loss of appetite, nausea  rash, fever, and swollen lymph nodes  redness, blistering, peeling or loosening of the skin, including inside the mouth  right upper belly pain  unusually weak or tired  yellowing of the eyes or skin Side effects that usually do not require medical attention (report to your doctor or health care professional if they continue or are bothersome):  changes in taste  diarrhea  hair loss  muscle or joint pain  stomach upset This list may not describe all possible side effects. Call your doctor for medical advice about side effects. You may report side effects to FDA at 1-800-FDA-1088. Where should I keep my medicine? Keep out of the reach of children. Store at room temperature between 15 and 30 degrees C (59 and 86 degrees F). Throw away any unused medicine after the expiration date. NOTE: This sheet is a summary. It may not cover all possible information. If you have questions about this medicine, talk to your doctor, pharmacist, or health care provider.  2021 Elsevier/Gold Standard (2019-02-02 15:35:11)  

## 2020-11-21 ENCOUNTER — Ambulatory Visit (INDEPENDENT_AMBULATORY_CARE_PROVIDER_SITE_OTHER): Payer: 59 | Admitting: Family

## 2020-11-21 ENCOUNTER — Encounter: Payer: Self-pay | Admitting: Family

## 2020-11-21 ENCOUNTER — Encounter: Payer: Self-pay | Admitting: Family Medicine

## 2020-11-21 DIAGNOSIS — R109 Unspecified abdominal pain: Secondary | ICD-10-CM

## 2020-11-21 LAB — CBC WITH DIFFERENTIAL/PLATELET
Absolute Monocytes: 243 cells/uL (ref 200–950)
Basophils Absolute: 42 cells/uL (ref 0–200)
Basophils Relative: 1.3 %
Eosinophils Absolute: 19 cells/uL (ref 15–500)
Eosinophils Relative: 0.6 %
HCT: 39.9 % (ref 38.5–50.0)
Hemoglobin: 14.1 g/dL (ref 13.2–17.1)
Lymphs Abs: 1053 cells/uL (ref 850–3900)
MCH: 31.8 pg (ref 27.0–33.0)
MCHC: 35.3 g/dL (ref 32.0–36.0)
MCV: 89.9 fL (ref 80.0–100.0)
MPV: 9.7 fL (ref 7.5–12.5)
Monocytes Relative: 7.6 %
Neutro Abs: 1843 cells/uL (ref 1500–7800)
Neutrophils Relative %: 57.6 %
Platelets: 259 10*3/uL (ref 140–400)
RBC: 4.44 10*6/uL (ref 4.20–5.80)
RDW: 12.3 % (ref 11.0–15.0)
Total Lymphocyte: 32.9 %
WBC: 3.2 10*3/uL — ABNORMAL LOW (ref 3.8–10.8)

## 2020-11-21 LAB — URINALYSIS, COMPLETE
Bilirubin, UA: NEGATIVE
Glucose, UA: NEGATIVE
Ketones, UA: NEGATIVE
Leukocytes,UA: NEGATIVE
Nitrite, UA: NEGATIVE
Protein,UA: NEGATIVE
Specific Gravity, UA: >1.03 — ABNORMAL HIGH (ref 1.005–1.030)
Urobilinogen, Ur: 0.2 mg/dL (ref 0.2–1.0)
pH, UA: 6 (ref 5.0–7.5)

## 2020-11-21 LAB — HEPATIC FUNCTION PANEL
AG Ratio: 2.1 (calc) (ref 1.0–2.5)
ALT: 14 U/L (ref 9–46)
AST: 15 U/L (ref 10–35)
Albumin: 4.4 g/dL (ref 3.6–5.1)
Alkaline phosphatase (APISO): 44 U/L (ref 35–144)
Bilirubin, Direct: 0.2 mg/dL (ref 0.0–0.2)
Globulin: 2.1 g/dL (calc) (ref 1.9–3.7)
Indirect Bilirubin: 0.7 mg/dL (calc) (ref 0.2–1.2)
Total Bilirubin: 0.9 mg/dL (ref 0.2–1.2)
Total Protein: 6.5 g/dL (ref 6.1–8.1)

## 2020-11-21 LAB — MICROSCOPIC EXAMINATION
RBC, Urine: NONE SEEN /hpf (ref 0–2)
WBC, UA: NONE SEEN /hpf (ref 0–5)

## 2020-11-21 NOTE — Progress Notes (Signed)
   Virtual Visit via telephone Note Due to COVID-19 pandemic this visit was conducted virtually. This visit type was conducted due to national recommendations for restrictions regarding the COVID-19 Pandemic (e.g. social distancing, sheltering in place) in an effort to limit this patient's exposure and mitigate transmission in our community. All issues noted in this document were discussed and addressed.  A physical exam was not performed with this format.  I connected with Samuel Powers on 11/21/20 at 2:18 pm  by telephone and verified that I am speaking with the correct person using two identifiers. Samuel Powers is currently located at work and no one is currently with him  during visit. The provider, Evelina Dun, FNP is located in their office at time of visit.  I discussed the limitations, risks, security and privacy concerns of performing an evaluation and management service by telephone and the availability of in person appointments. I also discussed with the patient that there may be a patient responsible charge related to this service. The patient expressed understanding and agreed to proceed.   History and Present Illness:  PT calls the office today right flank pain that started. He reports last year he had similar symptoms and found to have a 7 mm stone and an enlarged prostate. He had the stone removed and a biopsy of prostate. It was negative for cancer. He is followed by Urologists annually.  Back Pain This is a new problem. The current episode started in the past 7 days. The problem occurs intermittently. The problem has been waxing and waning since onset. Pain location: right flank. The quality of the pain is described as aching. The pain is at a severity of 5/10. The pain is mild. Pertinent negatives include no bowel incontinence, dysuria, fever, headaches, pelvic pain, perianal numbness or tingling. (No hematuria, urgency. ) He has tried bed rest and NSAIDs for the symptoms. The  treatment provided mild relief.      Review of Systems  Constitutional: Negative for fever.  Gastrointestinal: Negative for bowel incontinence.  Genitourinary: Negative for dysuria and pelvic pain.  Musculoskeletal: Positive for back pain.  Neurological: Negative for tingling and headaches.  All other systems reviewed and are negative.    Observations/Objective: No SOB or distress noted   Assessment and Plan: 1. Right flank pain PT will come for urine Force fluids If pain worsens or do not improve may need CT scan. Call Urologists for follow up.  - Urinalysis, Complete - Urine Culture     I discussed the assessment and treatment plan with the patient. The patient was provided an opportunity to ask questions and all were answered. The patient agreed with the plan and demonstrated an understanding of the instructions.   The patient was advised to call back or seek an in-person evaluation if the symptoms worsen or if the condition fails to improve as anticipated.  The above assessment and management plan was discussed with the patient. The patient verbalized understanding of and has agreed to the management plan. Patient is aware to call the clinic if symptoms persist or worsen. Patient is aware when to return to the clinic for a follow-up visit. Patient educated on when it is appropriate to go to the emergency department.   Time call ended:  2:29 pm   I provided 11 minutes of non-face-to-face time during this encounter.    Evelina Dun, FNP

## 2020-11-24 LAB — URINE CULTURE: Organism ID, Bacteria: NO GROWTH

## 2020-11-25 ENCOUNTER — Other Ambulatory Visit: Payer: Self-pay | Admitting: Podiatry

## 2020-11-25 ENCOUNTER — Encounter: Payer: Self-pay | Admitting: Urology

## 2020-11-25 DIAGNOSIS — Z79899 Other long term (current) drug therapy: Secondary | ICD-10-CM

## 2020-11-26 NOTE — Telephone Encounter (Signed)
He had no renal stones on his CT in 9/21.  If he is still having some lower abdominal pain we could consider a CT stone study, but if the pain has resolved, the small amount of blood on the dip UA is not enough to warrant additional imaging at this time.

## 2020-12-01 LAB — CBC WITH DIFFERENTIAL/PLATELET
Absolute Monocytes: 263 cells/uL (ref 200–950)
Basophils Absolute: 60 cells/uL (ref 0–200)
Basophils Relative: 1.7 %
Eosinophils Absolute: 21 cells/uL (ref 15–500)
Eosinophils Relative: 0.6 %
HCT: 41.3 % (ref 38.5–50.0)
Hemoglobin: 14.6 g/dL (ref 13.2–17.1)
Lymphs Abs: 1187 cells/uL (ref 850–3900)
MCH: 30.9 pg (ref 27.0–33.0)
MCHC: 35.4 g/dL (ref 32.0–36.0)
MCV: 87.5 fL (ref 80.0–100.0)
MPV: 9.5 fL (ref 7.5–12.5)
Monocytes Relative: 7.5 %
Neutro Abs: 1971 cells/uL (ref 1500–7800)
Neutrophils Relative %: 56.3 %
Platelets: 293 10*3/uL (ref 140–400)
RBC: 4.72 10*6/uL (ref 4.20–5.80)
RDW: 12.7 % (ref 11.0–15.0)
Total Lymphocyte: 33.9 %
WBC: 3.5 10*3/uL — ABNORMAL LOW (ref 3.8–10.8)

## 2020-12-02 ENCOUNTER — Encounter: Payer: Self-pay | Admitting: Podiatry

## 2020-12-09 ENCOUNTER — Other Ambulatory Visit: Payer: Self-pay | Admitting: Podiatry

## 2020-12-09 DIAGNOSIS — Z79899 Other long term (current) drug therapy: Secondary | ICD-10-CM

## 2020-12-09 NOTE — Progress Notes (Signed)
CBC ordered [employer] message last week about it

## 2020-12-12 LAB — CBC WITH DIFFERENTIAL/PLATELET
Absolute Monocytes: 316 cells/uL (ref 200–950)
Basophils Absolute: 51 cells/uL (ref 0–200)
Basophils Relative: 1.3 %
Eosinophils Absolute: 20 cells/uL (ref 15–500)
Eosinophils Relative: 0.5 %
HCT: 38.6 % (ref 38.5–50.0)
Hemoglobin: 13.7 g/dL (ref 13.2–17.1)
Lymphs Abs: 1459 cells/uL (ref 850–3900)
MCH: 31.4 pg (ref 27.0–33.0)
MCHC: 35.5 g/dL (ref 32.0–36.0)
MCV: 88.5 fL (ref 80.0–100.0)
MPV: 9.7 fL (ref 7.5–12.5)
Monocytes Relative: 8.1 %
Neutro Abs: 2055 cells/uL (ref 1500–7800)
Neutrophils Relative %: 52.7 %
Platelets: 288 10*3/uL (ref 140–400)
RBC: 4.36 10*6/uL (ref 4.20–5.80)
RDW: 12.6 % (ref 11.0–15.0)
Total Lymphocyte: 37.4 %
WBC: 3.9 10*3/uL (ref 3.8–10.8)

## 2021-01-03 ENCOUNTER — Other Ambulatory Visit: Payer: Self-pay | Admitting: Podiatry

## 2021-01-04 NOTE — Telephone Encounter (Signed)
Please advise 

## 2021-01-06 NOTE — Telephone Encounter (Signed)
Pharmacist(Stew) w/ [facility] is requesting an approval for generic Lamisil-250mg  if it is to be continued or a call back if discontinued([phone]) Patient has been on prescription for 3 months.Please advise.

## 2021-01-09 ENCOUNTER — Telehealth: Payer: Self-pay

## 2021-01-09 ENCOUNTER — Other Ambulatory Visit: Payer: Self-pay

## 2021-01-09 MED ORDER — NONFORMULARY OR COMPOUNDED ITEM: 3 refills | Status: DC

## 2021-01-09 NOTE — Telephone Encounter (Signed)
Prescription for anti-fungal nail solution faxed to [facility]

## 2021-01-26 ENCOUNTER — Encounter: Payer: Self-pay | Admitting: Family Medicine

## 2021-01-26 ENCOUNTER — Ambulatory Visit (INDEPENDENT_AMBULATORY_CARE_PROVIDER_SITE_OTHER): Payer: 59 | Admitting: Family Medicine

## 2021-01-26 DIAGNOSIS — J309 Allergic rhinitis, unspecified: Secondary | ICD-10-CM

## 2021-01-26 MED ORDER — MONTELUKAST SODIUM 10 MG PO TABS: 10.0000 mg | ORAL_TABLET | Freq: Every day | ORAL | 3 refills | Status: DC

## 2021-01-26 NOTE — Progress Notes (Signed)
   Virtual Visit via telephone Note Due to COVID-19 pandemic this visit was conducted virtually. This visit type was conducted due to national recommendations for restrictions regarding the COVID-19 Pandemic (e.g. social distancing, sheltering in place) in an effort to limit this patient's exposure and mitigate transmission in our community. All issues noted in this document were discussed and addressed.  A physical exam was not performed with this format.  I connected with Samuel Powers on 01/26/21 at 1218 by telephone and verified that I am speaking with the correct person using two identifiers. Samuel Powers is currently located in his car and no one is currently with her during the visit. The provider, Gwenlyn Perking, FNP is located in their office at time of visit.  I discussed the limitations, risks, security and privacy concerns of performing an evaluation and management service by telephone and the availability of in person appointments. I also discussed with the patient that there may be a patient responsible charge related to this service. The patient expressed understanding and agreed to proceed.  CC: nasal congestion  History and Present Illness:  HPI  Samuel Powers reports nasal congestion x3 days. He also reports watery, itchy eyes, scratchy throat, and fatigue. He has a history of seasonal allergies for which he takes zyrtec and flonase for daily. He has been taking dayquil and tylenol for his recent symptoms. He has had a negative home Covid test. He denies fever, body aches, or chills. He reports that his allergy flares often progress to sinus infection or bronchitis and he would like to know what else he can do to prevent that.   ROS As per HPI.   Observations/Objective: Negative unless specially indicated above in HPI.  Assessment and Plan: [name] was seen today for nasal congestion.  Diagnoses and all orders for this visit:  Allergic rhinitis, unspecified seasonality,  unspecified trigger Will add singular. Continue flonase and zyrtec daily. Mucinex DM and nasal saline for congestion. Stay well hydrated.  -     montelukast (SINGULAIR) 10 MG tablet; Take 1 tablet (10 mg total) by mouth at bedtime.   Follow Up Instructions: Return to office for new or worsening symptoms, or if symptoms persist.    I discussed the assessment and treatment plan with the patient. The patient was provided an opportunity to ask questions and all were answered. The patient agreed with the plan and demonstrated an understanding of the instructions.   The patient was advised to call back or seek an in-person evaluation if the symptoms worsen or if the condition fails to improve as anticipated.  The above assessment and management plan was discussed with the patient. The patient verbalized understanding of and has agreed to the management plan. Patient is aware to call the clinic if symptoms persist or worsen. Patient is aware when to return to the clinic for a follow-up visit. Patient educated on when it is appropriate to go to the emergency department.   Time call ended:  1229  I provided 11 minutes of phone time during this encounter.    Gwenlyn Perking, FNP

## 2021-03-05 ENCOUNTER — Ambulatory Visit (INDEPENDENT_AMBULATORY_CARE_PROVIDER_SITE_OTHER): Payer: 59 | Admitting: Urology

## 2021-03-05 ENCOUNTER — Encounter: Payer: Self-pay | Admitting: Urology

## 2021-03-05 ENCOUNTER — Other Ambulatory Visit: Payer: Self-pay

## 2021-03-05 VITALS — BP 121/75 | HR 76 | Temp 98.2°F | Ht 70.5 in | Wt 153.0 lb

## 2021-03-05 DIAGNOSIS — R3912 Poor urinary stream: Secondary | ICD-10-CM

## 2021-03-05 DIAGNOSIS — N401 Enlarged prostate with lower urinary tract symptoms: Secondary | ICD-10-CM

## 2021-03-05 DIAGNOSIS — N138 Other obstructive and reflux uropathy: Secondary | ICD-10-CM

## 2021-03-05 DIAGNOSIS — R972 Elevated prostate specific antigen [PSA]: Secondary | ICD-10-CM

## 2021-03-05 DIAGNOSIS — Z87442 Personal history of urinary calculi: Secondary | ICD-10-CM | POA: Diagnosis not present

## 2021-03-05 LAB — URINALYSIS, ROUTINE W REFLEX MICROSCOPIC
Bilirubin, UA: NEGATIVE
Glucose, UA: NEGATIVE
Ketones, UA: NEGATIVE
Leukocytes,UA: NEGATIVE
Nitrite, UA: NEGATIVE
Protein,UA: NEGATIVE
RBC, UA: NEGATIVE
Specific Gravity, UA: 1.02 (ref 1.005–1.030)
Urobilinogen, Ur: 0.2 mg/dL (ref 0.2–1.0)
pH, UA: 8.5 — ABNORMAL HIGH (ref 5.0–7.5)

## 2021-03-05 MED ORDER — ALFUZOSIN HCL ER 10 MG PO TB24
10.0000 mg | ORAL_TABLET | Freq: Every day | ORAL | 3 refills | Status: DC
Start: 2021-03-05 — End: 2022-03-04

## 2021-03-05 NOTE — Progress Notes (Signed)
Urological Symptom Review  Patient is experiencing the following symptoms: Frequent urination Get up at night to urinate Stream starts and stops   Review of Systems  Gastrointestinal (upper)  : Negative for upper GI symptoms  Gastrointestinal (lower) : Negative for lower GI symptoms  Constitutional : Negative for symptoms  Skin: Negative for skin symptoms  Eyes: Negative for eye symptoms  Ear/Nose/Throat : Negative for Ear/Nose/Throat symptoms  Hematologic/Lymphatic: Negative for Hematologic/Lymphatic symptoms  Cardiovascular : Negative for cardiovascular symptoms  Respiratory : Negative for respiratory symptoms  Endocrine: Negative for endocrine symptoms  Musculoskeletal: Negative for musculoskeletal symptoms  Neurological: Negative for neurological symptoms  Psychologic: Negative for psychiatric symptoms

## 2021-03-05 NOTE — Progress Notes (Signed)
Subjective: 1. BPH with urinary obstruction   2. Weak urinary stream   3. Elevated PSA   4. History of nephrolithiasis      Samuel Powers is a 60 yo male who is sent by Dr. Lajuana Ripple for gross hematuria after a walk with >30 RBC's on a UA on 04/24/20.  His Cr was 0.92 on 04/09/20.  He had it again over 05/11/20.  He has not seen it since.  He has intermittent urgency with some intermittency or straining to void.   He subsequently has had some right flank pain.  His UA today is clear.   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  He has no other GU history or complaints.   07/25/20:  [name] returns today in f/u.   He continues to have some right flank pain that is more of a discomfort and he has some frequency and urgency with straining to void.  His PSA is 3.8.  He had a CT that showed a 47mm bladder stone.  With bladder wall thickening and trilobar hyperplasia.  His IPSS remains elevated at 29.  09/05/20: [name] returns today in f/u from recent cystoscopy with bladder stone removal.  He is doing well with improvement in the voiding symptoms.  He has an improved stream with reduced intermittency.  He has had no hematuria or dysuria.   His prostate biopsy was negative.  The prostate volume was 40ml.   He remains on alfuzosin.  His IPSS has declined to 6 from 22.  03/05/21: [name] returns today in f/u for his history of bladder stones and an elevated PSA with negative prostate biopsy on 08/14/20.  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  HIs IPSS is 7 and his UA is clear.  He didn't get the PSA that was ordered for this visit.    IPSS    Row Name 03/05/21 0900         International Prostate Symptom Score   How often have you had the sensation of not emptying your bladder? Less than 1 in 5     How often have you had to urinate less than every two  hours? Less than 1 in 5 times     How often have you found you stopped and started again several times when you urinated? Less than 1 in 5 times     How often have you found it difficult to postpone urination? Less than 1 in 5 times     How often have you had a weak urinary stream? Less than 1 in 5 times     How often have you had to strain to start urination? Less than 1 in 5 times     How many times did you typically get up at night to urinate? 1 Time     Total IPSS Score 7           Quality of Life due to urinary symptoms   If you were to spend the rest of your life with your urinary condition just the way it is now how would you feel about that? Pleased            ROS:  ROS  Allergies  Allergen Reactions  . Codeine     Weird dreams and light headed    Past Medical History:  Diagnosis Date  .  Allergy   . Anxiety   . Depression   . Elevated PSA since june 2021  . GERD (gastroesophageal reflux disease)   . Hyperlipidemia   . Sleep apnea    wears mouth guard cpap not tolerated, moderate osa    Past Surgical History:  Procedure Laterality Date  . CYSTOSCOPY WITH LITHOLAPAXY N/A 08/14/2020   Procedure: CYSTOSCOPY WITH REMOVAL OF BLADDER STONE;  Surgeon: Irine Seal, MD;  Location: [facility];  Service: Urology;  Laterality: N/A;  . PROSTATE BIOPSY N/A 08/14/2020   Procedure: BIOPSY TRANSRECTAL ULTRASONIC PROSTATE (TUBP);  Surgeon: Irine Seal, MD;  Location: [facility];  Service: Urology;  Laterality: N/A;  . TONSILLECTOMY Bilateral 2000    Social History   Socioeconomic History  . Marital status: Single    Spouse name: Not on file  . Number of children: Not on file  . Years of education: Not on file  . Highest education level: Not on file  Occupational History  . Occupation: Engineer, maintenance (IT)  Tobacco Use  . Smoking status: Never Smoker  . Smokeless tobacco: Never Used  Vaping Use  . Vaping Use: Never used  Substance and Sexual Activity  .  Alcohol use: Not Currently  . Drug use: Never  . Sexual activity: Not on file  Other Topics Concern  . Not on file  Social History Narrative   Recently relocated back home from [place] to be closer to his mother, who resides in [place].   Social Determinants of Health   Financial Resource Strain: Not on file  Food Insecurity: Not on file  Transportation Needs: Not on file  Physical Activity: Not on file  Stress: Not on file  Social Connections: Not on file  Intimate Partner Violence: Not on file    Family History  Problem Relation Age of Onset  . COPD Mother   . Diabetes Mother   . Hypertension Mother   . Alcohol abuse Father   . COPD Brother     Anti-infectives: Anti-infectives (From admission, onward)   None      Current Outpatient Medications  Medication Sig Dispense Refill  . Ascorbic Acid (VITAMIN C WITH ROSE HIPS) 500 MG tablet Take 500 mg by mouth daily.    . cetirizine (ZYRTEC) 10 MG chewable tablet Chew 10 mg by mouth daily.    . Eszopiclone 3 MG TABS Take 1 tablet (3 mg total) by mouth at bedtime as needed. 90 tablet 1  . fluticasone (FLONASE) 50 MCG/ACT nasal spray Place into both nostrils daily.    . montelukast (SINGULAIR) 10 MG tablet Take 1 tablet (10 mg total) by mouth at bedtime. 30 tablet 3  . NONFORMULARY OR COMPOUNDED ITEM Antifungal solution: Terbinafine 3%, Fluconazole 2%, Tea Tree Oil 5%, Urea 10%, Ibuprofen 2% in DMSO suspension #6mL 1 each 3  . pantoprazole (PROTONIX) 40 MG tablet Take 1 tablet (40 mg total) by mouth daily. 90 tablet 3  . rosuvastatin (CRESTOR) 10 MG tablet Take 1 tablet (10 mg total) by mouth at bedtime. 90 tablet 3  . terbinafine (LAMISIL) 250 MG tablet Take 1 tablet (250 mg total) by mouth daily. 90 tablet 0  . valACYclovir (VALTREX) 1000 MG tablet Take 1,000 mg by mouth 2 (two) times daily as needed.     Marland Kitchen alfuzosin (UROXATRAL) 10 MG 24 hr tablet Take 1 tablet (10 mg total) by mouth daily with breakfast. 90 tablet 3   . SF 5000 PLUS 1.1 % CREA dental cream Take by mouth.  No current facility-administered medications for this visit.     Objective: Vital signs in last 24 hours: BP 121/75   Pulse 76   Temp 98.2 F (36.8 C)   Ht 5' 10.5" (1.791 m)   Wt 153 lb (69.4 kg)   BMI 21.64 kg/m   Intake/Output from previous day: No intake/output data recorded. Intake/Output this shift: @IOTHISSHIFT @   Physical Exam  Lab Results:  UA is unremarkable.  Results for orders placed or performed in visit on 03/05/21 (from the past 24 hour(s))  Urinalysis, Routine w reflex microscopic     Status: Abnormal   Collection Time: 03/05/21  9:15 AM  Result Value Ref Range   Specific Gravity, UA 1.020 1.005 - 1.030   pH, UA 8.5 (H) 5.0 - 7.5   Color, UA Yellow Yellow   Appearance Ur Hazy (A) Clear   Leukocytes,UA Negative Negative   Protein,UA Negative Negative/Trace   Glucose, UA Negative Negative   Ketones, UA Negative Negative   RBC, UA Negative Negative   Bilirubin, UA Negative Negative   Urobilinogen, Ur 0.2 0.2 - 1.0 mg/dL   Nitrite, UA Negative Negative   Microscopic Examination Comment    Narrative   Performed at:  [facility] [address] [place], [place]  [id number] Lab Director: Mina Marble MT, Phone:  [phone]    Studies/Results:    Assessment/Plan: BPH with BOO and Bladder stone.  He is doing well s/p stone removal and with the addition of alfuzosin.   I have refilled the alfuzosin and will have him return in a year with a renal US.   Elevated PSA.  PSA today and in 1 year.          Meds ordered this encounter  Medications  . alfuzosin (UROXATRAL) 10 MG 24 hr tablet    Sig: Take 1 tablet (10 mg total) by mouth daily with breakfast.    Dispense:  90 tablet    Refill:  3     Orders Placed This Encounter  Procedures  . US RENAL    Standing Status:   Future    Standing Expiration Date:   03/05/2022    Order Specific Question:   Reason for Exam (SYMPTOM   OR DIAGNOSIS REQUIRED)    Answer:   history of stones.  r/o recurrence    Order Specific Question:   Preferred imaging location?    Answer:   [facility]  . Urinalysis, Routine w reflex microscopic  . PSA, total and free  . PSA, total and free    Standing Status:   Future    Standing Expiration Date:   03/05/2022     Return in about 1 year (around 03/05/2022) for with renal US and PSA.Marland Kitchen    CC: Dr. Adam Phenix.      Irine Seal 03/05/2021 [phone]

## 2021-03-06 ENCOUNTER — Encounter: Payer: Self-pay | Admitting: Urology

## 2021-03-06 LAB — PSA, TOTAL AND FREE
PSA, Free Pct: 9.1 %
PSA, Free: 0.49 ng/mL
Prostate Specific Ag, Serum: 5.4 ng/mL — ABNORMAL HIGH (ref 0.0–4.0)

## 2021-03-07 NOTE — Progress Notes (Signed)
His PSA is up a bit from the prior level.  He had a negative prostate biopsy last year.  I would like to get a repeat PSA in 3 months and if there is further increase, he will need a prostate MRI.

## 2021-03-09 NOTE — Progress Notes (Signed)
Message sent through [place]. Appointment scheduled.

## 2021-04-15 ENCOUNTER — Encounter: Payer: Self-pay | Admitting: Family Medicine

## 2021-04-15 ENCOUNTER — Other Ambulatory Visit: Payer: Self-pay

## 2021-04-15 ENCOUNTER — Ambulatory Visit (INDEPENDENT_AMBULATORY_CARE_PROVIDER_SITE_OTHER): Payer: 59 | Admitting: Family Medicine

## 2021-04-15 VITALS — BP 116/78 | HR 62 | Temp 98.2°F | Ht 70.5 in | Wt 160.2 lb

## 2021-04-15 DIAGNOSIS — Z0001 Encounter for general adult medical examination with abnormal findings: Secondary | ICD-10-CM | POA: Diagnosis not present

## 2021-04-15 DIAGNOSIS — R972 Elevated prostate specific antigen [PSA]: Secondary | ICD-10-CM

## 2021-04-15 DIAGNOSIS — Z636 Dependent relative needing care at home: Secondary | ICD-10-CM

## 2021-04-15 DIAGNOSIS — Z0000 Encounter for general adult medical examination without abnormal findings: Secondary | ICD-10-CM

## 2021-04-15 DIAGNOSIS — E7800 Pure hypercholesterolemia, unspecified: Secondary | ICD-10-CM

## 2021-04-15 DIAGNOSIS — F5101 Primary insomnia: Secondary | ICD-10-CM

## 2021-04-15 DIAGNOSIS — K219 Gastro-esophageal reflux disease without esophagitis: Secondary | ICD-10-CM

## 2021-04-15 MED ORDER — ESZOPICLONE 3 MG PO TABS: 3.0000 mg | ORAL_TABLET | Freq: Every evening | ORAL | 1 refills | Status: DC | PRN

## 2021-04-15 NOTE — Patient Instructions (Signed)

## 2021-04-15 NOTE — Progress Notes (Addendum)
Samuel Powers is a 60 y.o. male presents to office today for annual physical exam examination.    Concerns today include:  1. Insomnia Working to wean from [place], as he does not want to be dependent on it.  He is currently taking 1/2 tablet.  Sleep has been stable.  Has cut back on caffeine in efforts to improve sleep as well.  Considering Melatonin if needed in future.  2. HLD Compliant with Crestor.  No reports of chest pain, abdominal pain.  States physically active and walks daily except for yesterday as he was chased by dog recently while running.  3.  Elevated PSA This is currently being evaluated by Dr. Jeffie Pollock.  He has BPH which is well controlled with the Uroxatrol he has been taking.  He reports improvement in stream and and urinary frequency.  No hematuria since passage of renal stones.  4.  GERD Patient reports good control of GERD off of Protonix.  No nausea, vomiting.  Bowel movements are normal.  No hematochezia or melena.  5.  Caregiver stress Patient has had some caregiver stress.  His mother lives independently in [place] and had had some health scares recently, for which she was hospitalized.  She has since recovered but he worries about her quite a bit.  He is put some safety measures in place.  Ultimately, he suspects that she will move in with him but he is try to keep her as independent as long as possible.  Occupation: CPO  Diet: Balanced, Exercise: Regular structured Last colonoscopy: Up-to-date Refills needed today: None Immunizations needed: Pneumonia and shingles due.  He will get these done on a Friday so that he has the weekend to recuperate  Immunization History  Administered Date(s) Administered  . Influenza,inj,Quad PF,6+ Mos 08/17/2019, 09/09/2020  . Moderna Sars-Covid-2 Vaccination 01/17/2020, 02/16/2020, 09/05/2020, 02/15/2021  . Tdap 04/09/2020     Past Medical History:  Diagnosis Date  . Allergy   . Anxiety   . Depression   . Elevated  PSA since june 2021  . GERD (gastroesophageal reflux disease)   . Hyperlipidemia   . Sleep apnea    wears mouth guard cpap not tolerated, moderate osa   Social History   Socioeconomic History  . Marital status: Single    Spouse name: Not on file  . Number of children: Not on file  . Years of education: Not on file  . Highest education level: Not on file  Occupational History  . Occupation: Engineer, maintenance (IT)  Tobacco Use  . Smoking status: Never Smoker  . Smokeless tobacco: Never Used  Vaping Use  . Vaping Use: Never used  Substance and Sexual Activity  . Alcohol use: Not Currently  . Drug use: Never  . Sexual activity: Not on file  Other Topics Concern  . Not on file  Social History Narrative   Recently relocated back home from [place] to be closer to his mother, who resides in [place].   Social Determinants of Health   Financial Resource Strain: Not on file  Food Insecurity: Not on file  Transportation Needs: Not on file  Physical Activity: Not on file  Stress: Not on file  Social Connections: Not on file  Intimate Partner Violence: Not on file   Past Surgical History:  Procedure Laterality Date  . CYSTOSCOPY WITH LITHOLAPAXY N/A 08/14/2020   Procedure: CYSTOSCOPY WITH REMOVAL OF BLADDER STONE;  Surgeon: Irine Seal, MD;  Location: [facility];  Service: Urology;  Laterality: N/A;  .  PROSTATE BIOPSY N/A 08/14/2020   Procedure: BIOPSY TRANSRECTAL ULTRASONIC PROSTATE (TUBP);  Surgeon: Irine Seal, MD;  Location: [facility];  Service: Urology;  Laterality: N/A;  . TONSILLECTOMY Bilateral 2000   Family History  Problem Relation Age of Onset  . COPD Mother   . Diabetes Mother   . Hypertension Mother   . Alcohol abuse Father   . COPD Brother     Current Outpatient Medications:  .  alfuzosin (UROXATRAL) 10 MG 24 hr tablet, Take 1 tablet (10 mg total) by mouth daily with breakfast., Disp: 90 tablet, Rfl: 3 .  Ascorbic Acid (VITAMIN C WITH  ROSE HIPS) 500 MG tablet, Take 500 mg by mouth daily., Disp: , Rfl:  .  cetirizine (ZYRTEC) 10 MG chewable tablet, Chew 10 mg by mouth daily., Disp: , Rfl:  .  Eszopiclone 3 MG TABS, Take 1 tablet (3 mg total) by mouth at bedtime as needed., Disp: 90 tablet, Rfl: 1 .  fluticasone (FLONASE) 50 MCG/ACT nasal spray, Place into both nostrils daily., Disp: , Rfl:  .  montelukast (SINGULAIR) 10 MG tablet, Take 1 tablet (10 mg total) by mouth at bedtime., Disp: 30 tablet, Rfl: 3 .  NONFORMULARY OR COMPOUNDED ITEM, Antifungal solution: Terbinafine 3%, Fluconazole 2%, Tea Tree Oil 5%, Urea 10%, Ibuprofen 2% in DMSO suspension #41m, Disp: 1 each, Rfl: 3 .  pantoprazole (PROTONIX) 40 MG tablet, Take 1 tablet (40 mg total) by mouth daily., Disp: 90 tablet, Rfl: 3 .  rosuvastatin (CRESTOR) 10 MG tablet, Take 1 tablet (10 mg total) by mouth at bedtime., Disp: 90 tablet, Rfl: 3 .  SF 5000 PLUS 1.1 % CREA dental cream, Take by mouth., Disp: , Rfl:  .  terbinafine (LAMISIL) 250 MG tablet, Take 1 tablet (250 mg total) by mouth daily., Disp: 90 tablet, Rfl: 0 .  valACYclovir (VALTREX) 1000 MG tablet, Take 1,000 mg by mouth 2 (two) times daily as needed. , Disp: , Rfl:   Allergies  Allergen Reactions  . Codeine     Weird dreams and light headed     ROS: Review of Systems Pertinent items noted in HPI and remainder of comprehensive ROS otherwise negative.    Physical exam BP 116/78   Pulse 62   Temp 98.2 F (36.8 C)   Ht 5' 10.5" (1.791 m)   Wt 160 lb 3.2 oz (72.7 kg)   SpO2 100%   BMI 22.66 kg/m  General appearance: alert, cooperative, appears stated age and no distress Head: Normocephalic, without obvious abnormality, atraumatic Eyes: negative findings: lids and lashes normal, conjunctivae and sclerae normal, corneas clear and pupils equal, round, reactive to light and accomodation Ears: normal TM's and external ear canals both ears Nose: Nares normal. Septum midline. Mucosa normal. No drainage or  sinus tenderness. Throat: lips, mucosa, and tongue normal; teeth and gums normal Neck: no adenopathy, no carotid bruit, supple, symmetrical, trachea midline and thyroid not enlarged, symmetric, no tenderness/mass/nodules Back: symmetric, no curvature. ROM normal. No CVA tenderness. Lungs: clear to auscultation bilaterally Chest wall: no tenderness Heart: regular rate and rhythm, S1, S2 normal, no murmur, click, rub or gallop Abdomen: soft, non-tender; bowel sounds normal; no masses,  no organomegaly Extremities: extremities normal, atraumatic, no cyanosis or edema Pulses: 2+ and symmetric Skin: Skin color, texture, turgor normal. No rashes or lesions Lymph nodes: Cervical, supraclavicular, and axillary nodes normal. Neurologic: Alert and oriented X 3, normal strength and tone. Normal symmetric reflexes. Normal coordination and gait Psych: Mood stable.  Patient is pleasant and interactive.  Thought process linear Depression screen [facility] 2/9 04/15/2021 04/15/2021 10/13/2020  Decreased Interest 0 0 0  Down, Depressed, Hopeless 1 1 0  PHQ - 2 Score 1 1 0  Altered sleeping 1 - -  Tired, decreased energy 1 - -  Change in appetite 0 - -  Feeling bad or failure about yourself  0 - -  Trouble concentrating 1 - -  Moving slowly or fidgety/restless - - -  Suicidal thoughts 0 - -  PHQ-9 Score 4 - -  Difficult doing work/chores Somewhat difficult - -    Assessment/ Plan: Milus Banister here for annual physical exam.   Annual physical exam  Pure hypercholesterolemia - Plan: TSH, Lipid Panel, CMP14+EGFR, CANCELED: Lipid Panel, CANCELED: CMP14+EGFR, CANCELED: TSH  Primary insomnia - Plan: TSH, Eszopiclone 3 MG TABS, CANCELED: TSH, CANCELED: ToxASSURE Select 13 (MW), Urine  Elevated PSA - Plan: PSA  Caregiver stress  Gastroesophageal reflux disease without esophagitis  Plan for fasting lipid panel, CMP and TSH when he comes back in for his vaccinations.  I reviewed wean of [place].  He may  continue half tablet every other day until bottle is finished.  May use up to 10 mg of melatonin at bedtime if needed but we discussed naturopathic remedies including herbal tea, warm milk.  He will have PSA collected when he comes back in for labs and this has been CCed to his urologist  Seems to be dealing with caregiver stress independently.  He is welcome to return to me if symptoms worsen and we can talk about pharmacologic and/or therapy if needed at that time  GERD well controlled off of Protonix.  Okay to discontinue.  Med list has been updated   Patient to follow up in 1 year for annual exam or sooner if needed.  Romana Deaton M. Lajuana Ripple, DO

## 2021-04-17 ENCOUNTER — Ambulatory Visit (INDEPENDENT_AMBULATORY_CARE_PROVIDER_SITE_OTHER): Payer: 59

## 2021-04-17 ENCOUNTER — Other Ambulatory Visit: Payer: Self-pay

## 2021-04-17 DIAGNOSIS — Z23 Encounter for immunization: Secondary | ICD-10-CM

## 2021-04-17 DIAGNOSIS — R972 Elevated prostate specific antigen [PSA]: Secondary | ICD-10-CM

## 2021-04-17 DIAGNOSIS — E7800 Pure hypercholesterolemia, unspecified: Secondary | ICD-10-CM

## 2021-04-17 DIAGNOSIS — F5101 Primary insomnia: Secondary | ICD-10-CM

## 2021-04-17 LAB — CMP14+EGFR
ALT: 14 IU/L (ref 0–44)
AST: 17 IU/L (ref 0–40)
Albumin/Globulin Ratio: 2 (ref 1.2–2.2)
Albumin: 4.5 g/dL (ref 3.8–4.9)
Alkaline Phosphatase: 56 IU/L (ref 44–121)
BUN/Creatinine Ratio: 16 (ref 9–20)
BUN: 14 mg/dL (ref 6–24)
Bilirubin Total: 0.9 mg/dL (ref 0.0–1.2)
CO2: 26 mmol/L (ref 20–29)
Calcium: 9.7 mg/dL (ref 8.7–10.2)
Chloride: 102 mmol/L (ref 96–106)
Creatinine, Ser: 0.9 mg/dL (ref 0.76–1.27)
Globulin, Total: 2.2 g/dL (ref 1.5–4.5)
Glucose: 89 mg/dL (ref 65–99)
Potassium: 5.3 mmol/L — ABNORMAL HIGH (ref 3.5–5.2)
Sodium: 141 mmol/L (ref 134–144)
Total Protein: 6.7 g/dL (ref 6.0–8.5)
eGFR: 99 mL/min/{1.73_m2} (ref >59)

## 2021-04-17 LAB — LIPID PANEL
Chol/HDL Ratio: 2.4 ratio (ref 0.0–5.0)
Cholesterol, Total: 154 mg/dL (ref 100–199)
HDL: 64 mg/dL (ref >39)
LDL Chol Calc (NIH): 76 mg/dL (ref 0–99)
Triglycerides: 70 mg/dL (ref 0–149)
VLDL Cholesterol Cal: 14 mg/dL (ref 5–40)

## 2021-04-17 NOTE — Progress Notes (Signed)
Pneumovax given to left deltoid.  Patient tolerated well.

## 2021-04-18 ENCOUNTER — Encounter: Payer: Self-pay | Admitting: Family Medicine

## 2021-04-18 LAB — PSA: Prostate Specific Ag, Serum: 6.9 ng/mL — ABNORMAL HIGH (ref 0.0–4.0)

## 2021-04-18 LAB — TSH: TSH: 3.76 u[IU]/mL (ref 0.450–4.500)

## 2021-04-20 ENCOUNTER — Other Ambulatory Visit: Payer: Self-pay

## 2021-04-20 ENCOUNTER — Encounter: Payer: Self-pay | Admitting: Urology

## 2021-04-20 DIAGNOSIS — R972 Elevated prostate specific antigen [PSA]: Secondary | ICD-10-CM

## 2021-04-20 NOTE — Progress Notes (Signed)
His PSA has been rising and was up to 6.9 on recent labs.   This is a further increase and about twice what it was at the time of his biopsy a year ago.   He needs to have an MRI of the prostate to further evaluate the elevation.   Please get this scheduled and arrange f/u with me to discuss the results.  Ifhe needs to speak to me prior to scheduling the MRI, please let me know.      Per Dr. Jeffie Pollock- MRI prostate ordered. Patient notified by My chart message.

## 2021-04-25 ENCOUNTER — Other Ambulatory Visit: Payer: Self-pay | Admitting: Family Medicine

## 2021-04-25 DIAGNOSIS — E785 Hyperlipidemia, unspecified: Secondary | ICD-10-CM

## 2021-05-02 ENCOUNTER — Ambulatory Visit
Admission: RE | Admit: 2021-05-02 | Discharge: 2021-05-02 | Disposition: A | Discharge status: Home / Self Care | Payer: 59 | Source: Ambulatory Visit | Attending: Urology | Admitting: Urology

## 2021-05-02 ENCOUNTER — Other Ambulatory Visit: Payer: Self-pay

## 2021-05-02 DIAGNOSIS — R972 Elevated prostate specific antigen [PSA]: Secondary | ICD-10-CM

## 2021-05-02 MED ORDER — GADOBENATE DIMEGLUMINE 529 MG/ML IV SOLN
15.0000 mL | Freq: Once | INTRAVENOUS | Status: AC | PRN
  Administered 2021-05-02: 15 mL via INTRAVENOUS

## 2021-05-06 ENCOUNTER — Encounter: Payer: Self-pay | Admitting: Family Medicine

## 2021-05-07 ENCOUNTER — Encounter: Payer: Self-pay | Admitting: Urology

## 2021-05-07 ENCOUNTER — Ambulatory Visit (INDEPENDENT_AMBULATORY_CARE_PROVIDER_SITE_OTHER): Payer: 59 | Admitting: Urology

## 2021-05-07 ENCOUNTER — Other Ambulatory Visit: Payer: Self-pay

## 2021-05-07 VITALS — BP 133/73 | HR 72 | Temp 98.1°F | Wt 160.0 lb

## 2021-05-07 DIAGNOSIS — R972 Elevated prostate specific antigen [PSA]: Secondary | ICD-10-CM | POA: Diagnosis not present

## 2021-05-07 DIAGNOSIS — R9389 Abnormal findings on diagnostic imaging of other specified body structures: Secondary | ICD-10-CM | POA: Diagnosis not present

## 2021-05-07 MED ORDER — LEVOFLOXACIN 750 MG PO TABS: ORAL_TABLET | ORAL | 0 refills | Status: DC

## 2021-05-07 NOTE — Progress Notes (Signed)
Subjective: 1. Elevated PSA   2. Abnormal results on imaging study of genitourinary system      Samuel Powers is a 60 yo male who is sent by Dr. Lajuana Ripple for gross hematuria after a walk with >30 RBC's on a UA on 04/24/20.  His Cr was 0.92 on 04/09/20.  He had it again over 05/11/20.  He has not seen it since.  He has intermittent urgency with some intermittency or straining to void.   He subsequently has had some right flank pain.  His UA today is clear.   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  He has no other GU history or complaints.   07/25/20:  [name] returns today in f/u.   He continues to have some right flank pain that is more of a discomfort and he has some frequency and urgency with straining to void.  His PSA is 3.8.  He had a CT that showed a 48mm bladder stone.  With bladder wall thickening and trilobar hyperplasia.  His IPSS remains elevated at 29.  09/05/20: [name] returns today in f/u from recent cystoscopy with bladder stone removal.  He is doing well with improvement in the voiding symptoms.  He has an improved stream with reduced intermittency.  He has had no hematuria or dysuria.   His prostate biopsy was negative.  The prostate volume was 71ml.   He remains on alfuzosin.  His IPSS has declined to 6 from 59.  03/05/21: [name] returns today in f/u for his history of bladder stones and an elevated PSA with negative prostate biopsy on 08/14/20.  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  HIs IPSS is 7 and his UA is clear.  He didn't get the PSA that was ordered for this visit.  05/07/21: [name] returns today in f/u for his history of an elevated PSA with a negative biopsy in 10/21.  His PSA has continued to rise and was  5.4 on 03/05/21 with a 9.1% f/t ratio and a repeat on 04/17/21 was 6.9.  He had an MRIP on 05/02/21 and it showed a  25ml prostate with a small PIRADS 4 lesion in the right anterior TZ.        ROS:  ROS  Allergies  Allergen Reactions   Codeine     Weird dreams and light headed    Past Medical History:  Diagnosis Date   Allergy    Anxiety    Depression    Elevated PSA since june 2021   GERD (gastroesophageal reflux disease)    Hyperlipidemia    Sleep apnea    wears mouth guard cpap not tolerated, moderate osa    Past Surgical History:  Procedure Laterality Date   CYSTOSCOPY WITH LITHOLAPAXY N/A 08/14/2020   Procedure: CYSTOSCOPY WITH REMOVAL OF BLADDER STONE;  Surgeon: Irine Seal, MD;  Location[facility];  Service: Urology;  Laterality: N/A;   PROSTATE BIOPSY N/A 08/14/2020   Procedure: BIOPSY TRANSRECTAL ULTRASONIC PROSTATE (TUBP);  Surgeon: Irine Seal, MD;  Location: [facility];  Service: Urology;  Laterality: N/A;   TONSILLECTOMY Bilateral 2000    Social History   Socioeconomic History   Marital status: Single    Spouse name: Not on file   Number of children: Not on file   Years of education: Not on file  Highest education level: Not on file  Occupational History   Occupation: Engineer, maintenance (IT)  Tobacco Use   Smoking status: Never   Smokeless tobacco: Never  Vaping Use   Vaping Use: Never used  Substance and Sexual Activity   Alcohol use: Not Currently   Drug use: Never   Sexual activity: Not on file  Other Topics Concern   Not on file  Social History Narrative   Recently relocated back home from [place] to be closer to his mother, who resides in [place].   Social Determinants of Health   Financial Resource Strain: Not on file  Food Insecurity: Not on file  Transportation Needs: Not on file  Physical Activity: Not on file  Stress: Not on file  Social Connections: Not on file  Intimate Partner Violence: Not on file    Family History  Problem Relation Age of Onset   COPD Mother    Diabetes Mother    Hypertension Mother     Alcohol abuse Father    COPD Brother     Anti-infectives: Anti-infectives (From admission, onward)    Start     Dose/Rate Route Frequency Ordered Stop   05/07/21 0000  levofloxacin (LEVAQUIN) 750 MG tablet           05/07/21 1626         Current Outpatient Medications  Medication Sig Dispense Refill   levofloxacin (LEVAQUIN) 750 MG tablet Take 1 tab po one hour prior to procedure. 1 tablet 0   alfuzosin (UROXATRAL) 10 MG 24 hr tablet Take 1 tablet (10 mg total) by mouth daily with breakfast. 90 tablet 3   Ascorbic Acid (VITAMIN C WITH ROSE HIPS) 500 MG tablet Take 500 mg by mouth daily.     cetirizine (ZYRTEC) 10 MG chewable tablet Chew 10 mg by mouth daily.     Eszopiclone 3 MG TABS Take 1 tablet (3 mg total) by mouth at bedtime as needed for up to 14 days. 1/2 tablet every other day until gone 90 tablet 1   fluticasone (FLONASE) 50 MCG/ACT nasal spray Place into both nostrils daily.     montelukast (SINGULAIR) 10 MG tablet Take 1 tablet (10 mg total) by mouth at bedtime. 30 tablet 3   NONFORMULARY OR COMPOUNDED ITEM Antifungal solution: Terbinafine 3%, Fluconazole 2%, Tea Tree Oil 5%, Urea 10%, Ibuprofen 2% in DMSO suspension #73mL 1 each 3   rosuvastatin (CRESTOR) 10 MG tablet TAKE 1 TABLET AT BEDTIME 90 tablet 3   SF 5000 PLUS 1.1 % CREA dental cream Take by mouth.     No current facility-administered medications for this visit.     Objective: Vital signs in last 24 hours: BP 133/73   Pulse 72   Temp 98.1 F (36.7 C)   Wt 160 lb (72.6 kg)   BMI 22.63 kg/m   Intake/Output from previous day: No intake/output data recorded. Intake/Output this shift: @IOTHISSHIFT @   Physical Exam  Lab Results:   No results found for this or any previous visit (from the past 24 hour(s)).  Lab Results  Component Value Date   PSA1 6.9 (H) 04/17/2021   PSA1 5.4 (H) 03/05/2021   PSA1 3.8 04/09/2020    Studies/Results: MR reviewed.    CLINICAL DATA:  Elevated PSA level of 6.9  on 04/17/2021. Benign biopsy 08/14/2020   EXAM: MR PROSTATE WITHOUT AND WITH CONTRAST   TECHNIQUE: Multiplanar multisequence MRI images were obtained of the pelvis centered about the prostate. Pre and post contrast images were obtained.  CONTRAST:  27mL MULTIHANCE GADOBENATE DIMEGLUMINE 529 MG/ML IV SOLN   COMPARISON:  CT pelvis 07/11/2020   FINDINGS: Prostate: Region of interest # 1: PI-RADS category 4 partially but not majority encapsulated nodule of the right anterior transition zone in the mid prostate, with restriction of diffusion and low ADC map activity, measuring 0.76 cubic cm (1.5 by 1.0 by 0.9 cm. This is shown for example on image 10 series 7 and image 33 series 9.   Volume: 3D volumetric analysis: Prostate volume 23.42 cubic cm.   Transcapsular spread:  Absent   Seminal vesicle involvement: Absent   Neurovascular bundle involvement: Absent   Pelvic adenopathy: Absent   Bone metastasis: Absent   Other findings: Small amount of nonspecific free pelvic fluid.   IMPRESSION: 1. Small PI-RADS category 4 lesion of the right anterior transition zone in the mid prostate. Targeting data sent to [facility]. 2. Small amount of nonspecific free pelvic fluid.    Assessment/Plan  Elevated PSA.  His PSA continues to rise and he has a PIRADS 4 lesion in the right anterior TZ.   I am going to get him set up for an MR fusion biopsy and reviewed the risks of bleeding, infection and voiding difficulty.   I sent Levaquin and will do him with nitrous oxide.         BPH with BOO.  He will remains on alfuzosin.   Meds ordered this encounter  Medications   levofloxacin (LEVAQUIN) 750 MG tablet    Sig: Take 1 tab po one hour prior to procedure.    Dispense:  1 tablet    Refill:  0     Orders Placed This Encounter  Procedures   Ambulatory referral to Urology    Referral Priority:   Urgent    Referral Type:   Consultation    Referral Reason:   Specialty Services Required     Referred to Provider:   Irine Seal, MD    Requested Specialty:   Urology    Number of Visits Requested:   1     Return for f/u to discuss the results of the MRI fusion biopsy. .    CC: Dr. Adam Phenix.      Irine Seal 05/07/2021 [id number]

## 2021-05-27 ENCOUNTER — Other Ambulatory Visit: Payer: Self-pay | Admitting: Family Medicine

## 2021-05-27 DIAGNOSIS — J309 Allergic rhinitis, unspecified: Secondary | ICD-10-CM

## 2021-06-04 ENCOUNTER — Other Ambulatory Visit: Payer: Self-pay | Admitting: Urology

## 2021-06-09 ENCOUNTER — Other Ambulatory Visit: Payer: 59

## 2021-06-11 ENCOUNTER — Other Ambulatory Visit: Payer: Self-pay

## 2021-06-11 ENCOUNTER — Ambulatory Visit (INDEPENDENT_AMBULATORY_CARE_PROVIDER_SITE_OTHER): Payer: 59 | Admitting: Urology

## 2021-06-11 ENCOUNTER — Encounter: Payer: Self-pay | Admitting: Urology

## 2021-06-11 DIAGNOSIS — R972 Elevated prostate specific antigen [PSA]: Secondary | ICD-10-CM | POA: Diagnosis not present

## 2021-06-11 DIAGNOSIS — C61 Malignant neoplasm of prostate: Secondary | ICD-10-CM

## 2021-06-11 NOTE — Progress Notes (Signed)
Subjective: 1. Elevated PSA   2. Prostate cancer [facility])      Samuel Powers returns today to discuss his recent MR fusion prostate biopsy.   Samuel Powers is a 60 yo male who is sent by Dr. Lajuana Ripple for gross hematuria after a walk with >30 RBC's on a UA on 04/24/20.  His Cr was 0.92 on 04/09/20.  He had it again over 05/11/20.  He has not seen it since.  He has intermittent urgency with some intermittency or straining to void.   He subsequently has had some right flank pain.  His UA today is clear.   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  He has no other GU history or complaints.   07/25/20:  Samuel Powers returns today in f/u.   He continues to have some right flank pain that is more of a discomfort and he has some frequency and urgency with straining to void.  His PSA is 3.8.  He had a CT that showed a 80m bladder stone.  With bladder wall thickening and trilobar hyperplasia.  His IPSS remains elevated at 29.  09/05/20: Samuel Powers today in f/u from recent cystoscopy with bladder stone removal.  He is doing well with improvement in the voiding symptoms.  He has an improved stream with reduced intermittency.  He has had no hematuria or dysuria.   His prostate biopsy was negative.  The prostate volume was 266m   He remains on alfuzosin.  His IPSS has declined to 6 from 2963 03/05/21: HeLorinzoeturns today in f/u for his history of bladder stones and an elevated PSA with negative prostate biopsy on 08/14/20.  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  HIs IPSS is 7 and his UA is clear.  He didn't get the PSA that was ordered for this visit.  05/07/21: HeAlamineturns today in f/u for his history of an elevated PSA with a negative biopsy in 10/21.  His PSA has continued to rise and was  5.4 on 03/05/21 with a 9.1% f/t ratio and a repeat on 04/17/21 was 6.9.  He had  an MRIP on 05/02/21 and it showed a 2358mrostate with a small PIRADS 4 lesion in the right anterior TZ.    06/11/21: Samuel Powers today to discuss his recent MR fusion biopsy.   He was found to have a 76m40mostate with Gleason 7(3+4) disease in 2/3 ROI biopsies and Gleason 6 in 1/3 ROI biopsies.   He also had Gleason 7(3+4) in the RMM and RAM biopsies and Gleason 6 in the RML and RAL as well as the LAM biopsies.   His most recent PSA is 6.9.   [facility] 60% OCD, 39% ECE, 4% LNI and 4% SVI.      ROS:  ROS  Allergies  Allergen Reactions   Codeine     Weird dreams and light headed    Past Medical History:  Diagnosis Date   Allergy    Anxiety    Depression    Elevated PSA since june 2021   GERD (gastroesophageal reflux disease)    Hyperlipidemia    Sleep apnea    wears mouth guard cpap not tolerated, moderate osa    Past Surgical History:  Procedure Laterality Date   CYSTOSCOPY WITH LITHOLAPAXY N/A 08/14/2020   Procedure: CYSTOSCOPY WITH REMOVAL OF BLADDER STONE;  Surgeon: Irine Seal, MD;  Location: [facility];  Service: Urology;  Laterality: N/A;   PROSTATE BIOPSY N/A 08/14/2020   Procedure: BIOPSY TRANSRECTAL ULTRASONIC PROSTATE (TUBP);  Surgeon: Irine Seal, MD;  Location: [facility];  Service: Urology;  Laterality: N/A;   TONSILLECTOMY Bilateral 2000    Social History   Socioeconomic History   Marital status: Single    Spouse name: Not on file   Number of children: Not on file   Years of education: Not on file   Highest education level: Not on file  Occupational History   Occupation: Engineer, maintenance (IT)  Tobacco Use   Smoking status: Never   Smokeless tobacco: Never  Vaping Use   Vaping Use: Never used  Substance and Sexual Activity   Alcohol use: Not Currently   Drug use: Never   Sexual activity: Not on file  Other Topics Concern   Not on file  Social History Narrative   Recently relocated back home from [place] to be closer to his  mother, who resides in [place].   Social Determinants of Health   Financial Resource Strain: Not on file  Food Insecurity: Not on file  Transportation Needs: Not on file  Physical Activity: Not on file  Stress: Not on file  Social Connections: Not on file  Intimate Partner Violence: Not on file    Family History  Problem Relation Age of Onset   COPD Mother    Diabetes Mother    Hypertension Mother    Alcohol abuse Father    COPD Brother     Anti-infectives: Anti-infectives (From admission, onward)    None       Current Outpatient Medications  Medication Sig Dispense Refill   alfuzosin (UROXATRAL) 10 MG 24 hr tablet Take 1 tablet (10 mg total) by mouth daily with breakfast. 90 tablet 3   Ascorbic Acid (VITAMIN C WITH ROSE HIPS) 500 MG tablet Take 500 mg by mouth daily.     cetirizine (ZYRTEC) 10 MG chewable tablet Chew 10 mg by mouth daily.     fluticasone (FLONASE) 50 MCG/ACT nasal spray Place into both nostrils daily.     montelukast (SINGULAIR) 10 MG tablet TAKE ONE TABLET AT BEDTIME 30 tablet 5   NONFORMULARY OR COMPOUNDED ITEM Antifungal solution: Terbinafine 3%, Fluconazole 2%, Tea Tree Oil 5%, Urea 10%, Ibuprofen 2% in DMSO suspension #40m 1 each 3   rosuvastatin (CRESTOR) 10 MG tablet TAKE 1 TABLET AT BEDTIME 90 tablet 3   SF 5000 PLUS 1.1 % CREA dental cream Take by mouth.     No current facility-administered medications for this visit.     Objective: Vital signs in last 24 hours: There were no vitals taken for this visit.  Intake/Output from previous day: No intake/output data recorded. Intake/Output this shift: '@IOTHISSHIFT'$ @   Physical Exam  Lab Results:   No results found for this or any previous visit (from the past 24 hour(s)).  Lab Results  Component Value Date   PSA1 6.9 (H) 04/17/2021   PSA1 5.4 (H) 03/05/2021   PSA1 3.8 04/09/2020    Studies/Results: MR reviewed.    CLINICAL DATA:  Elevated PSA level of 6.9 on 04/17/2021.  Benign biopsy 08/14/2020   EXAM: MR PROSTATE WITHOUT AND WITH CONTRAST   TECHNIQUE: Multiplanar multisequence MRI images were obtained of the pelvis centered about the prostate. Pre and post contrast images were obtained.   CONTRAST:  170mMULTIHANCE GADOBENATE DIMEGLUMINE 529 MG/ML IV SOLN   COMPARISON:  CT pelvis 07/11/2020   FINDINGS: Prostate: Region of interest # 1: PI-RADS category 4 partially but not majority encapsulated nodule of the right anterior transition zone in the mid prostate, with restriction of diffusion and low ADC map activity, measuring 0.76 cubic cm (1.5 by 1.0 by 0.9 cm. This is shown for example on image 10 series 7 and image 33 series 9.   Volume: 3D volumetric analysis: Prostate volume 23.42 cubic cm.   Transcapsular spread:  Absent   Seminal vesicle involvement: Absent   Neurovascular bundle involvement: Absent   Pelvic adenopathy: Absent   Bone metastasis: Absent   Other findings: Small amount of nonspecific free pelvic fluid.   IMPRESSION: 1. Small PI-RADS category 4 lesion of the right anterior transition zone in the mid prostate. Targeting data sent to [place]. 2. Small amount of nonspecific free pelvic fluid.   Path reviewed.   Assessment/Plan Prostate cancer.   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  He has mild LUTS on alfuzosin and normal erections but that is not a concern for him.  I discussed options for treatment.   He is not a good candidate for surveillance.  I briefly discussed cryotherapy and HIFU but didn't recommend them for him.   I primarily discussed RALP, Brachytherapy and EXRT as well as the [place] associated with the radiation options.   I feel he is a good candidate for RALP and Brachytherapy and he is most interested in Brachytherapy which he had some interest in before our discussion.   I have given him information handouts for RALP,  Brachytherapy and SpaceOAR that document the associated risks  and I will get him set up for an appointment with the [facility] in [facility] for a further review of his options.        BPH with BOO.  He will remain on alfuzosin.   No orders of the defined types were placed in this encounter.    Orders Placed This Encounter  Procedures   Ambulatory referral to Urology    Referral Priority:   Routine    Referral Type:   Consultation    Referral Reason:   Specialty Services Required    Referred to Provider:   Raynelle Bring, MD    Requested Specialty:   Urology    Number of Visits Requested:   1     Return for I will arrange further f/u based on his treatment decision. .    CC: Dr. Adam Phenix.      Irine Seal 06/11/2021 [phone]

## 2021-06-26 ENCOUNTER — Encounter: Payer: Self-pay | Admitting: Family Medicine

## 2021-07-02 ENCOUNTER — Ambulatory Visit: Payer: 59 | Admitting: Urology

## 2021-07-08 NOTE — Telephone Encounter (Signed)
Please see patient question. 

## 2021-07-10 ENCOUNTER — Encounter: Payer: Self-pay | Admitting: Medical Oncology

## 2021-07-14 ENCOUNTER — Encounter: Payer: Self-pay | Admitting: Medical Oncology

## 2021-07-15 NOTE — Progress Notes (Signed)
I called pt to introduce myself as the Prostate Nurse Navigator and the Coordinator of the Prostate [place].  1. I confirmed with the patient he is aware of his referral to the clinic 9/9 arriving @ 8 am.   2. I discussed the format of the clinic and the physicians he will be seeing that day.  3. I discussed where the clinic is located and how to contact me.  4. I confirmed his address and informed him I would be mailing a packet of information and forms to be completed. I asked him to bring them with him the day of his appointment.   He voiced understanding of the above. I asked him to call me if he has any questions or concerns regarding his appointments or the forms he needs to complete.

## 2021-07-16 NOTE — Progress Notes (Signed)
Radiation Oncology         ([phone] ________________________________  Multidisciplinary Prostate Cancer Clinic  Initial Radiation Oncology Consultation  Name: Samuel Powers MRN: [id number]  Date: 07/17/2021  DOB: 11/04/1961  [id number], [name], DO  Irine Seal, MD   REFERRING PHYSICIAN: Irine Seal, MD  DIAGNOSIS: 60 y.o. gentleman with stage T1c adenocarcinoma of the prostate with a Gleason's score of 3+4 and a PSA of 6.9    ICD-10-CM   1. Malignant neoplasm of prostate ([place])  C61       HISTORY OF PRESENT ILLNESS::Samuel Powers is a 60 y.o. gentleman. He was initially referred to Dr. Jeffie Powers in fall 2021 for gross hematuria. He was found to have a bladder stone, as well as a PSA of 3.8, and underwent cystoscopy. The bladder stone was removed, and prostate biopsy was performed at the same time. Pathology from the prostate biopsy was benign.  Subsequent PSA reading showed an upward trend at 5.4 on 03/05/2021 and up to 6.9 on 04/17/21.  Therefore, he underwent prostate MRI on 05/02/21 showing a small PI-RADS 4 lesion of the right anterior transition zone in the mid prostate. The prostate volume was estimated to be 23.42.     The patient proceeded to MRI fusion biopsy of the prostate on 06/01/21.  The prostate volume measured 26 cc.  Out of 15 core biopsies, 7 were positive.  The maximum Gleason score was 3+4, and this was seen in the right mid, right apex, and two cores from the ROI MRI lesion. Additionally, Gleason 3+3 was seen in the left aoex, right mid lateral, right apex lateral, and the third ROI MRI lesion core.     The patient reviewed the biopsy results with his urologist and he has kindly been referred today to the multidisciplinary prostate cancer clinic for presentation of pathology and radiology studies in our conference for discussion of potential radiation treatment options and clinical evaluation.  PREVIOUS RADIATION THERAPY: No  PAST MEDICAL HISTORY:  has a past  medical history of Allergy, Anxiety, Depression, Elevated PSA (since june 2021), GERD (gastroesophageal reflux disease), Hyperlipidemia, and Sleep apnea.    PAST SURGICAL HISTORY: Past Surgical History:  Procedure Laterality Date   CYSTOSCOPY WITH LITHOLAPAXY N/A 08/14/2020   Procedure: CYSTOSCOPY WITH REMOVAL OF BLADDER STONE;  Surgeon: Irine Seal, MD;  Location: [facility];  Service: Urology;  Laterality: N/A;   PROSTATE BIOPSY N/A 08/14/2020   Procedure: BIOPSY TRANSRECTAL ULTRASONIC PROSTATE (TUBP);  Surgeon: Irine Seal, MD;  Location: [facility];  Service: Urology;  Laterality: N/A;   TONSILLECTOMY Bilateral 2000    FAMILY HISTORY: family history includes Alcohol abuse in his father; COPD in his brother and mother; Diabetes in his mother; Hypertension in his mother.  SOCIAL HISTORY:  reports that he has never smoked. He has never used smokeless tobacco. He reports that he does not currently use alcohol. He reports that he does not use drugs.  ALLERGIES: Codeine  MEDICATIONS:  Current Outpatient Medications  Medication Sig Dispense Refill   alfuzosin (UROXATRAL) 10 MG 24 hr tablet Take 1 tablet (10 mg total) by mouth daily with breakfast. 90 tablet 3   Ascorbic Acid (VITAMIN C WITH ROSE HIPS) 500 MG tablet Take 500 mg by mouth daily.     cetirizine (ZYRTEC) 10 MG chewable tablet Chew 10 mg by mouth daily.     fluticasone (FLONASE) 50 MCG/ACT nasal spray Place into both nostrils daily.     montelukast (SINGULAIR)  10 MG tablet TAKE ONE TABLET AT BEDTIME 30 tablet 5   NONFORMULARY OR COMPOUNDED ITEM Antifungal solution: Terbinafine 3%, Fluconazole 2%, Tea Tree Oil 5%, Urea 10%, Ibuprofen 2% in DMSO suspension #1m 1 each 3   rosuvastatin (CRESTOR) 10 MG tablet TAKE 1 TABLET AT BEDTIME 90 tablet 3   SF 5000 PLUS 1.1 % CREA dental cream Take by mouth.     No current facility-administered medications for this encounter.    REVIEW OF SYSTEMS:  On review  of systems, the patient reports that he is doing well overall. He denies any chest pain, shortness of breath, cough, fevers, chills, night sweats, unintended weight changes. He denies any bowel disturbances, and denies abdominal pain, nausea or vomiting. He denies any new musculoskeletal or joint aches or pains. His IPSS was 8, indicating mild urinary symptoms. His SHIM was 25, indicating he does not have erectile dysfunction. A complete review of systems is obtained and is otherwise negative.   PHYSICAL EXAM:  Wt Readings from Last 3 Encounters:  07/17/21 162 lb 3.2 oz (73.6 kg)  05/07/21 160 lb (72.6 kg)  04/15/21 160 lb 3.2 oz (72.7 kg)   Temp Readings from Last 3 Encounters:  07/17/21 97.7 F (36.5 C) (Oral)  05/07/21 98.1 F (36.7 C)  04/15/21 98.2 F (36.8 C)   BP Readings from Last 3 Encounters:  07/17/21 (!) 135/92  05/07/21 133/73  04/15/21 116/78   Pulse Readings from Last 3 Encounters:  07/17/21 75  05/07/21 72  04/15/21 62   Pain Assessment Pain Score: 0-No pain/10  In general this is a well appearing Caucasian male in no acute distress. He's alert and oriented x4 and appropriate throughout the examination. Cardiopulmonary assessment is negative for acute distress and he exhibits normal effort.    KPS = 100  100 - Normal; no complaints; no evidence of disease. 90   - Able to carry on normal activity; minor signs or symptoms of disease. 80   - Normal activity with effort; some signs or symptoms of disease. 751  - Cares for self; unable to carry on normal activity or to do active work. 60   - Requires occasional assistance, but is able to care for most of his personal needs. 50   - Requires considerable assistance and frequent medical care. 474  - Disabled; requires special care and assistance. 351  - Severely disabled; hospital admission is indicated although death not imminent. 278  - Very sick; hospital admission necessary; active supportive treatment  necessary. 10   - Moribund; fatal processes progressing rapidly. 0     - Dead  Karnofsky DA, Abelmann WTipton Craver LS and Burchenal [facility]([phone] The use of the nitrogen mustards in the palliative treatment of carcinoma: with particular reference to bronchogenic carcinoma Cancer 1 634-56   LABORATORY DATA:  Lab Results  Component Value Date   WBC 3.9 12/12/2020   HGB 13.7 12/12/2020   HCT 38.6 12/12/2020   MCV 88.5 12/12/2020   PLT 288 12/12/2020   Lab Results  Component Value Date   NA 141 04/17/2021   K 5.3 (H) 04/17/2021   CL 102 04/17/2021   CO2 26 04/17/2021   Lab Results  Component Value Date   ALT 14 04/17/2021   AST 17 04/17/2021   ALKPHOS 56 04/17/2021   BILITOT 0.9 04/17/2021     RADIOGRAPHY: No results found.    IMPRESSION/PLAN: 60y.o. gentleman with Stage T1c adenocarcinoma of the prostate with  a Gleason score of 3+4 and a PSA of 6.9.    We discussed the patient's workup and outlined the nature of prostate cancer in this setting. The patient's T stage, Gleason's score, and PSA put him into the favorable intermediate risk group. Accordingly, he is eligible for a variety of potential treatment options including brachytherapy, 5.5 weeks of external radiation, or prostatectomy. We discussed the available radiation techniques, and focused on the details and logistics of delivery. We discussed and outlined the risks, benefits, short and long-term effects associated with radiotherapy and compared and contrasted these with prostatectomy. We discussed the role of SpaceOAR gel in reducing the rectal toxicity associated with radiotherapy. He appears to have a good understanding of his disease and our treatment recommendations which are of curative intent.  He was encouraged to ask questions that were answered to his stated satisfaction.  At the end of the conversation the patient is undecided but appears to be leaning towards brachytherapy versus RALP. We will share our discussion  with Dr. Jeffie Powers. He knows that he is welcome to call with any additional questions and he has our contact information and will let us know if he ultimately elects to proceed with radiotherapy. We enjoyed meeting him today and look forward to participating in his care if he so chooses.   We personally spent 60 minutes in this encounter including chart review, reviewing radiological studies, meeting face-to-face with the patient, entering orders and completing documentation.    Nicholos Johns, PA-C    Tyler Pita, MD  [facility] Direct Dial: [phone]  Fax: [phone] [email or web address]  Skype  [email or web address]   This document serves as a record of services personally performed by Tyler Pita, MD and Freeman Caldron, PA-C. It was created on their behalf by Wilburn Mylar, a trained medical scribe. The creation of this record is based on the scribe's personal observations and the provider's statements to them. This document has been checked and approved by the attending provider.

## 2021-07-17 ENCOUNTER — Encounter: Payer: Self-pay | Admitting: General Practice

## 2021-07-17 ENCOUNTER — Inpatient Hospital Stay: Payer: 59 | Attending: Oncology | Admitting: Oncology

## 2021-07-17 ENCOUNTER — Encounter: Payer: Self-pay | Admitting: Urology

## 2021-07-17 ENCOUNTER — Ambulatory Visit
Admission: RE | Admit: 2021-07-17 | Discharge: 2021-07-17 | Disposition: A | Discharge status: Home / Self Care | Payer: 59 | Source: Ambulatory Visit | Attending: Radiation Oncology | Admitting: Radiation Oncology

## 2021-07-17 ENCOUNTER — Other Ambulatory Visit: Payer: Self-pay

## 2021-07-17 DIAGNOSIS — C61 Malignant neoplasm of prostate: Secondary | ICD-10-CM | POA: Insufficient documentation

## 2021-07-17 NOTE — Progress Notes (Signed)
Reason for the request:    Prostate cancer  HPI: I was asked by Dr. Jeffie Pollock to evaluate [name] for the evaluation of prostate cancer.  He is a 60 year old man without any significant comorbid conditions was found elevated PSA initially of 3.8 and subsequently 6.9.  He presented with gross hematuria in June 2021 and his evaluation showed he bladder stone at that time.  Initially had a cystoscopy and a bladder stone removal with improvement in his voiding symptoms.  He has subsequently had a prostate biopsy initially that was negative but given the rise in his PSA it was repeated in July 2022.  Prior to his biopsy he had an MRI which showed showed a small PI-RADS category 4 lesion in the right anterior transitional zone prostate.  Prostate biopsy obtained on June 01, 2021 showed a Gleason score 3+3 equal 6 as well as 3+4 equal 7 in 2 cores.  Targeted lesions showed a Gleason score 3+4 equal 7.  Clinically, he does report some lower urinary tract symptoms including mild nocturia with IPSS score of 8.  He remains reasonably active health without any decline in ability to perform most activities of daily living.  Continues to try to run and exercise regularly.  He does not report any headaches, blurry vision, syncope or seizures. Does not report any fevers, chills or sweats.  Does not report any cough, wheezing or hemoptysis.  Does not report any chest pain, palpitation, orthopnea or leg edema.  Does not report any nausea, vomiting or abdominal pain.  Does not report any constipation or diarrhea.  Does not report any skeletal complaints.    Does not report frequency, urgency or hematuria.  Does not report any skin rashes or lesions. Does not report any heat or cold intolerance.  Does not report any lymphadenopathy or petechiae.  Does not report any anxiety or depression.  Remaining review of systems is negative.     Past Medical History:  Diagnosis Date   Allergy    Anxiety    Depression    Elevated  PSA since june 2021   GERD (gastroesophageal reflux disease)    Hyperlipidemia    Sleep apnea    wears mouth guard cpap not tolerated, moderate osa  :   Past Surgical History:  Procedure Laterality Date   CYSTOSCOPY WITH LITHOLAPAXY N/A 08/14/2020   Procedure: CYSTOSCOPY WITH REMOVAL OF BLADDER STONE;  Surgeon: Irine Seal, MD;  Location: [facility];  Service: Urology;  Laterality: N/A;   PROSTATE BIOPSY N/A 08/14/2020   Procedure: BIOPSY TRANSRECTAL ULTRASONIC PROSTATE (TUBP);  Surgeon: Irine Seal, MD;  Location: [facility];  Service: Urology;  Laterality: N/A;   TONSILLECTOMY Bilateral 2000  :   Current Outpatient Medications:    alfuzosin (UROXATRAL) 10 MG 24 hr tablet, Take 1 tablet (10 mg total) by mouth daily with breakfast., Disp: 90 tablet, Rfl: 3   Ascorbic Acid (VITAMIN C WITH ROSE HIPS) 500 MG tablet, Take 500 mg by mouth daily., Disp: , Rfl:    cetirizine (ZYRTEC) 10 MG chewable tablet, Chew 10 mg by mouth daily., Disp: , Rfl:    fluticasone (FLONASE) 50 MCG/ACT nasal spray, Place into both nostrils daily., Disp: , Rfl:    montelukast (SINGULAIR) 10 MG tablet, TAKE ONE TABLET AT BEDTIME, Disp: 30 tablet, Rfl: 5   NONFORMULARY OR COMPOUNDED ITEM, Antifungal solution: Terbinafine 3%, Fluconazole 2%, Tea Tree Oil 5%, Urea 10%, Ibuprofen 2% in DMSO suspension #55m, Disp: 1 each, Rfl: 3  rosuvastatin (CRESTOR) 10 MG tablet, TAKE 1 TABLET AT BEDTIME, Disp: 90 tablet, Rfl: 3   SF 5000 PLUS 1.1 % CREA dental cream, Take by mouth., Disp: , Rfl: :   Allergies  Allergen Reactions   Codeine     Weird dreams and light headed  :   Family History  Problem Relation Age of Onset   COPD Mother    Diabetes Mother    Hypertension Mother    Alcohol abuse Father    COPD Brother   :   Social History   Socioeconomic History   Marital status: Single    Spouse name: Not on file   Number of children: Not on file   Years of education: Not on file    Highest education level: Not on file  Occupational History   Occupation: Engineer, maintenance (IT)  Tobacco Use   Smoking status: Never   Smokeless tobacco: Never  Vaping Use   Vaping Use: Never used  Substance and Sexual Activity   Alcohol use: Not Currently   Drug use: Never   Sexual activity: Not on file  Other Topics Concern   Not on file  Social History Narrative   Recently relocated back home from [place] to be closer to his mother, who resides in [place].   Social Determinants of Health   Financial Resource Strain: Not on file  Food Insecurity: Not on file  Transportation Needs: Not on file  Physical Activity: Not on file  Stress: Not on file  Social Connections: Not on file  Intimate Partner Violence: Not on file  :  Pertinent items are noted in HPI.  Exam: ECOG 0 General appearance: alert and cooperative appeared without distress. Head: atraumatic without any abnormalities. Eyes: conjunctivae/corneas clear. PERRL.  Sclera anicteric. Throat: lips, mucosa, and tongue normal; without oral thrush or ulcers. Resp: clear to auscultation bilaterally without rhonchi, wheezes or dullness to percussion. Cardio: regular rate and rhythm, S1, S2 normal, no murmur, click, rub or gallop GI: soft, non-tender; bowel sounds normal; no masses,  no organomegaly Skin: Skin color, texture, turgor normal. No rashes or lesions Lymph nodes: Cervical, supraclavicular, and axillary nodes normal. Neurologic: Grossly normal without any motor, sensory or deep tendon reflexes. Musculoskeletal: No joint deformity or effusion.    Assessment and Plan:    60 year old man with prostate cancer Gleason July 2022.  He was found to have a Gleason score 3+4 equal 7 and a PSA of 6.9.  His Gleason's score based on targeted biopsy showed 40% pattern 4 disease.   His case was discussed today in the prostate cancer multidisciplinary clinic and treatment options were reviewed.  His pathology results were reviewed  with the reviewing pathologist and and imaging studies including MRI discussed with radiology.  Treatment options including primary surgical therapy versus radiation therapy using external beam and seed implant boost was discussed.  Oncological outcomes were reviewed and he understands the equivalency of outcomes at this time.  Complication associated with both treatments were reviewed as well as showed the sequence of treatments were discussed including the high risk associated with surgery postradiation or salvage.  He also understands that primary surgical therapy lends itself to try modality therapy if he has more disease afterwards.  Role for systemic therapy was discussed at this time and remains limited given his intermediate risk disease.  He understands if he develops advanced disease more options are not available at that time.  All his questions were answered to his satisfaction.  He will make his  decision in near future after learning about all treatment options.   45  minutes were dedicated to this visit. The time was spent on reviewing laboratory data, imaging studies, discussing treatment options,  and answering questions regarding future plan.     A copy of this consult has been forwarded to the requesting physician.

## 2021-07-17 NOTE — Progress Notes (Signed)
[place] Psychosocial Distress Screening Spiritual Care  Met with Samuel Powers and a supportive friend in [facility] to introduce [place] team/resources, reviewing distress screen per protocol.  The patient scored a 4 on the Psychosocial Distress Thermometer which indicates moderate distress. Also assessed for distress and other psychosocial needs.   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  Follow up needed: No. [name] prefers to reach out as needed/desired. He has full packet of [place] support team/programming information.   [place], [place], [facility] Pager [phone] Voicemail [phone]

## 2021-07-17 NOTE — Consult Note (Signed)
Multi-Disciplinary Clinic 07/17/2021    Samuel Powers         MRN: [id number] PRIMARY CARE:     REFERRING:     PROVIDER:  Irine Powers, M.D.  DOB: 1961/09/07, 60 year old Male TREATING:  Samuel Powers, M.D.  SSN:  LOCATION:  [facility] [phone]    CC/HPI: CC: Prostate Cancer   Physician requesting consult: Dr. Irine Powers  PCP: Samuel Powers  Location of consult: [facility] - Prostate Cancer Multidisciplinary Clinic   Samuel Powers is a 60 year old gentleman who presented to [facility] Powers last year with gross hematuria. He was found to have a bladder stone and a PSA of 3.8 prompting him to undergo removal of his bladder stone cystoscopically along with a prostate biopsy on 08/28/21 that was negative for malignancy. He was placed on alfuzosin for his BPH considering his baseline symptoms and bladder outlet obstruction evidenced by his bladder stone formation. His PSA was followed and began rising to 5.4 and then to 6.9. He therefore underwent an MRI of the prostate on 05/02/21 that demonstrated a 1.5 cm PI-RADS 4 lesion at the right anterior transition zone. This led to an MR/US fusion biopsy that demonstrated Gleason 3+4=7 adenocarcinoma with 8 out of 15 biopsy cores positive including 3 out of 3 targeted lesions. His voiding symptoms have improved on alpha blocker therapy but he does have a prominent intravesical median lobe.   Family history: None.   Imaging studies: MRI (05/02/21): No EPE, SVI, LAD, or bone lesions.   PMH: He has a history of depression, anxiety, GERD, hyperlipidemia, and obesity.  PSH: No abdominal surgeries.   TNM stage: cT1c N0 Mx  PSA: 6.9  Gleason score: 3+4=7 (GG 2)  Biopsy (05/02/21): 8/15 cores positive  Left: L apex (10%, 3+3=6)  Right: R apex (10%, 3+4=7), R lateral apex (10%, 3+3=6), R mid (20%, 3+4=7), R lateral mid (10%, 3+3=6)  MR target: 3/3 cores (2 cores with Gleason 3+4=7 - 60%, 50%, 1 core with Gleason 3+3=6,  50%)  Prostate volume: 26.0 cc   Nomogram  OC disease: 55%  EPE: 43%  SVI: 5%  LNI: 5%  PFS (5 year, 10 year): 80%, 68%   Urinary function: IPSS is 8. He remains on alfuzosin and this has been helpful.  Erectile function: SHIM score is 25.     ALLERGIES: Codeine    MEDICATIONS: Zyrtec  Alfuzosin Hcl Er 10 mg tablet, extended release 24 hr  Flonase Allergy Relief  Magnesium  Melatonin  Rosuvastatin Calcium 10 mg tablet  Vitamin D     GU PSH: Cysto Remove Stent FB Sim - 08/14/2020 Prostate Needle Biopsy - 06/01/2021, 08/14/2020       PSH Notes: Bladder stones removal   NON-GU PSH: Surgical Pathology, Gross And Microscopic Examination For Prostate Needle - 06/01/2021 Tonsillectomy         GU PMH: Abnormal radiologic findings on diagnostic imaging of other urinary organs - 06/01/2021 Elevated PSA - 06/01/2021    NON-GU PMH: Anxiety Depression GERD Hypercholesterolemia Sleep Apnea    FAMILY HISTORY: Chronic Obstructive Pulmonary Disease - Runs in Family Cirrhosis - Father Diabetes - Mother Hypercholesterolemia - Mother   SOCIAL HISTORY: Marital Status: Single Drinks 2 caffeinated drinks per day.    REVIEW OF SYSTEMS:    GU Review Male:  Patient denies frequent urination, hard to postpone urination, burning/ pain with urination, get up at night to urinate, leakage of  urine, stream starts and stops, trouble starting your streams, and have to strain to urinate .   Gastrointestinal (Upper):  Patient denies nausea and vomiting.   Gastrointestinal (Lower):  Patient denies diarrhea and constipation.   Constitutional:  Patient denies fever, night sweats, weight loss, and fatigue.   Skin:  Patient denies skin rash/ lesion and itching.   Eyes:  Patient denies blurred vision and double vision.   Ears/ Nose/ Throat:  Patient denies sore throat and sinus problems.   Hematologic/Lymphatic:  Patient denies swollen glands and easy bruising.   Cardiovascular:  Patient denies chest  pains and leg swelling.   Respiratory:  Patient denies cough and shortness of breath.   Endocrine:  Patient denies excessive thirst.   Musculoskeletal:  Patient denies back pain and joint pain.   Neurological:  Patient denies headaches and dizziness.   Psychologic:  Patient denies depression and anxiety.   VITAL SIGNS: None    MULTI-SYSTEM PHYSICAL EXAMINATION:     Constitutional: Well-nourished. No physical deformities. Normally developed. Good grooming.          Complexity of Data:   Lab Test Review:  PSA  Records Review:  Pathology Reports, Previous Patient Records   PROCEDURES: None   ASSESSMENT:     ICD-10 Details  1 GU:  Prostate Cancer - C61    PLAN:   Document  Letter(s):  Created for Patient: Clinical Summary   Notes:  1. Prostate cancer: I had a detailed discussion with Samuel Powers and his wife today. He has been counseled by Dr. Alen Powers and Dr. Tammi Powers earlier this morning. The patient was counseled about the natural history of prostate cancer and the standard treatment options that are available for prostate cancer. It was explained to him how his age and life expectancy, clinical stage, Gleason score, and PSA affect his prognosis, the decision to proceed with additional staging studies, as well as how that information influences recommended treatment strategies. We discussed the roles for active surveillance, radiation therapy, surgical therapy, androgen deprivation, as well as ablative therapy options for the treatment of prostate cancer as appropriate to his individual cancer situation. We discussed the risks and benefits of these options with regard to their impact on cancer control and also in terms of potential adverse events, complications, and impact on quality of life particularly related to urinary and sexual function. The patient was encouraged to ask questions throughout the discussion today and all questions were answered to his stated satisfaction. In addition, the  patient was provided with and/or directed to appropriate resources and literature for further education about prostate cancer and treatment options.   We discussed surgical therapy for prostate cancer including the different available surgical approaches. We discussed, in detail, the risks and expectations of surgery with regard to cancer control, urinary control, and erectile function as well as the expected postoperative recovery process. Additional risks of surgery including but not limited to bleeding, infection, hernia formation, nerve damage, lymphocele formation, bowel/rectal injury potentially necessitating colostomy, damage to the urinary tract resulting in urine leakage, urethral stricture, and the cardiopulmonary risks such as myocardial infarction, stroke, death, venothromboembolism, etc. were explained. The risk of open surgical conversion for robotic/laparoscopic prostatectomy was also discussed.   At this time, he appears to be leaning toward brachytherapy. We did specifically discuss the implications of his intravesical median lobe and how this may affect the quality of a radiation seed implantation as well as the potential issues related to post-treatment urinary symptoms or options  for treatment of symptoms should they worsen. Currently, his symptoms are well controlled but he would be at higher risk for urinary complications considering his history of bladder outlet obstruction, response to alpha blocker therapy, etc.   If he ultimately elects surgical therapy, he will notify me and we can consider scheduling him for a BNS RAL radical prostatectomy and BPLND but I would need to see him preoperatively for an exam.   CC: Dr. Zola Button  Dr. Tyler Pita  Dr. Irine Powers  Samuel Powers         E & M CODES: We spent 48 minutes dedicated to evaluation and management time, including face to face interaction, discussions on coordination of care, documentation, result review,  and discussion with others as applicable.

## 2021-07-17 NOTE — Progress Notes (Signed)
                               Care Plan Summary  Name: Regan Cavataio DOB: 12/06/1960   Your Medical Team:   Urologist -  Dr. Raynelle Bring, [facility]  Radiation Oncologist - Dr. Tyler Pita, [facility]   Medical Oncologist - Dr. Zola Button, [place]  Recommendations: 1) Seed Implant  2) Surgery   * These recommendations are based on information available as of today's consult.      Recommendations may change depending on the results of further tests or exams.    Next Steps: 1)Consider your options. 2)Contact clinic to discuss options.   When appointments need to be scheduled, you will be contacted by [facility] and/or [facility].  Questions?  Please do not hesitate to call Cira Rue, RN, BSN, OCN or Katheren Puller, RN, BSN at [phone] any questions or concerns.  Robin/Liz is your Oncology Nurse Navigator and is available to assist you while you're receiving your medical care at [facility].

## 2021-07-17 NOTE — Telephone Encounter (Signed)
Please advise 

## 2021-07-21 ENCOUNTER — Encounter: Payer: Self-pay | Admitting: Medical Oncology

## 2021-07-21 NOTE — Progress Notes (Signed)
Patient called and states he has decided on seed implant to treat his prostate cancer. I informed him Enid Derry will coordinate the surgery with his urologist and Dr. Tammi Klippel. She will reach out to him to discuss. He voiced understanding. Dr. Tammi Klippel, Jaynee Eagles and [place] notified.

## 2021-07-24 ENCOUNTER — Telehealth: Payer: Self-pay | Admitting: *Deleted

## 2021-07-24 NOTE — Telephone Encounter (Signed)
CALLED PATIENT TO UPDATE, SPOKE WITH PATIENT 

## 2021-08-05 ENCOUNTER — Telehealth: Payer: Self-pay | Admitting: *Deleted

## 2021-08-05 ENCOUNTER — Encounter: Payer: Self-pay | Admitting: Urology

## 2021-08-05 ENCOUNTER — Other Ambulatory Visit: Payer: Self-pay | Admitting: Urology

## 2021-08-05 NOTE — Telephone Encounter (Signed)
Called patient to update, spoke with patient. 

## 2021-08-07 ENCOUNTER — Telehealth: Payer: Self-pay | Admitting: *Deleted

## 2021-08-07 NOTE — Telephone Encounter (Signed)
Called patient to inform of pre-seed appts. and implant date, spoke with patient and he is aware of these appts. 

## 2021-08-24 ENCOUNTER — Encounter: Payer: Self-pay | Admitting: Family Medicine

## 2021-08-24 ENCOUNTER — Telehealth: Payer: Self-pay | Admitting: *Deleted

## 2021-08-24 ENCOUNTER — Encounter: Payer: Self-pay | Admitting: Urology

## 2021-08-24 NOTE — Telephone Encounter (Signed)
RETURNED PATIENT'S PHONE CALL, SPOKE WITH PATIENT. ?

## 2021-08-27 ENCOUNTER — Other Ambulatory Visit: Payer: Self-pay | Admitting: Urology

## 2021-08-27 ENCOUNTER — Encounter: Payer: Self-pay | Admitting: Radiation Oncology

## 2021-08-27 ENCOUNTER — Encounter: Payer: Self-pay | Admitting: Family Medicine

## 2021-08-28 ENCOUNTER — Telehealth: Payer: Self-pay

## 2021-08-28 NOTE — Telephone Encounter (Signed)
Notified patient of Dr. Johny Shears note... "Shingles vaccine is fine to receive anytime and a colonoscopy "before" the seed implant is fine but if scheduled after the seed implant, it must be scheduled for at least 6 months post the seed implant". Patient had no questions and verbalized understanding of information given.

## 2021-09-02 ENCOUNTER — Telehealth: Payer: Self-pay | Admitting: *Deleted

## 2021-09-02 ENCOUNTER — Encounter: Payer: Self-pay | Admitting: Family Medicine

## 2021-09-02 NOTE — Telephone Encounter (Signed)
CALLED PATIENT TO REMIND OF PRE-SEED APPTS. FOR 09-03-21, SPOKE WITH PATIENT AND HE IS AWARE OF THESE APPTS.

## 2021-09-03 ENCOUNTER — Encounter (HOSPITAL_COMMUNITY)
Admission: RE | Admit: 2021-09-03 | Discharge: 2021-09-03 | Disposition: A | Discharge status: Home / Self Care | Payer: 59 | Source: Ambulatory Visit | Attending: Urology | Admitting: Urology

## 2021-09-03 ENCOUNTER — Ambulatory Visit (HOSPITAL_COMMUNITY)
Admission: RE | Admit: 2021-09-03 | Discharge: 2021-09-03 | Disposition: A | Discharge status: Home / Self Care | Payer: 59 | Source: Ambulatory Visit | Attending: Urology | Admitting: Urology

## 2021-09-03 ENCOUNTER — Encounter: Payer: Self-pay | Admitting: Urology

## 2021-09-03 ENCOUNTER — Other Ambulatory Visit: Payer: Self-pay

## 2021-09-03 ENCOUNTER — Ambulatory Visit
Admission: RE | Admit: 2021-09-03 | Discharge: 2021-09-03 | Disposition: A | Discharge status: Home / Self Care | Payer: 59 | Source: Ambulatory Visit | Attending: Urology | Admitting: Urology

## 2021-09-03 ENCOUNTER — Ambulatory Visit
Admission: RE | Admit: 2021-09-03 | Discharge: 2021-09-03 | Disposition: A | Discharge status: Home / Self Care | Payer: 59 | Source: Ambulatory Visit | Attending: Radiation Oncology | Admitting: Radiation Oncology

## 2021-09-03 DIAGNOSIS — C61 Malignant neoplasm of prostate: Secondary | ICD-10-CM

## 2021-09-03 DIAGNOSIS — Z01818 Encounter for other preprocedural examination: Secondary | ICD-10-CM

## 2021-09-03 NOTE — Progress Notes (Signed)
Patient reports some unrelated insomnia and occasional fatigue. No other symptoms reported at this time.  Meaningful use complete.  Currently on Alfuzosin 10mg  as directed. Urology follow-up scheduled for November 22nd, 2022 -per [facility].  There were no vitals taken for this visit.

## 2021-09-03 NOTE — Progress Notes (Signed)
  Radiation Oncology         [phone] ________________________________  Name: Samuel Powers MRN: [id number]  Date: 09/03/2021  DOB: 11/27/1960  SIMULATION AND TREATMENT PLANNING NOTE PUBIC ARCH STUDY  [id number], Dara Hoyer, MD  DIAGNOSIS: 60 y.o. gentleman with stage T1c adenocarcinoma of the prostate with a Gleason's score of 3+4 and a PSA of 6.9  Oncology History  Malignant neoplasm of prostate ([place])  06/01/2021 Cancer Staging   Staging form: Prostate, AJCC 8th Edition - Clinical stage from 06/01/2021: Stage IIB (cT1c, cN0, cM0, PSA: 6.9, Grade Group: 2) - Signed by Freeman Caldron, PA-C on 07/17/2021 Histopathologic type: Adenocarcinoma, NOS Stage prefix: Initial diagnosis Prostate specific antigen (PSA) range: Less than 10 Gleason primary pattern: 3 Gleason secondary pattern: 4 Gleason score: 7 Histologic grading system: 5 grade system Number of biopsy cores examined: 15 Number of biopsy cores positive: 8 Location of positive needle core biopsies: Both sides    07/17/2021 Initial Diagnosis   Malignant neoplasm of prostate ([place])       ICD-10-CM   1. Malignant neoplasm of prostate ([place])  C61       COMPLEX SIMULATION:  The patient presented today for evaluation for possible prostate seed implant. He was brought to the radiation planning suite and placed supine on the CT couch. A 3-dimensional image study set was obtained in upload to the planning computer. There, on each axial slice, I contoured the prostate gland. Then, using three-dimensional radiation planning tools I reconstructed the prostate in view of the structures from the transperineal needle pathway to assess for possible pubic arch interference. In doing so, I did not appreciate any pubic arch interference. Also, the patient's prostate volume was estimated based on the drawn structure. The volume was 23 cc.  Given the pubic arch appearance and prostate volume, patient remains a good candidate to  proceed with prostate seed implant. Today, he freely provided informed written consent to proceed.    PLAN: The patient will undergo prostate seed implant.   ________________________________  Sheral Apley. Tammi Klippel, M.D.

## 2021-09-04 ENCOUNTER — Encounter: Payer: Self-pay | Admitting: General Practice

## 2021-09-04 NOTE — Progress Notes (Signed)
[place] Psychosocial Distress Screening Clinical Social Work  Clinical Social Work was referred by distress screening protocol.  The patient scored a 6 on the Psychosocial Distress Thermometer which indicates moderate distress. Clinical Social Worker contacted patient by phone to assess for distress and other psychosocial needs. CSW and patient discussed common feeling and emotions when being diagnosed with cancer, and the importance of support during treatment.  CSW informed patient of the support team and support services at [facility].  CSW provided contact information and encouraged patient to call with any questions or concerns.   ONCBCN DISTRESS SCREENING 09/03/2021  Screening Type   Distress experienced in past week (1-10) 6  Practical problem type   Family Problem type   Emotional problem type Nervousness/Anxiety;Adjusting to illness  Referral to support programs     Clinical Social Worker follow up needed: No.  If yes, follow up plan:  Beverely Pace, [place], LCSW Clinical Social Worker Phone:  [phone]

## 2021-09-07 ENCOUNTER — Other Ambulatory Visit: Payer: Self-pay | Admitting: Family Medicine

## 2021-09-07 ENCOUNTER — Other Ambulatory Visit: Payer: Self-pay | Admitting: Urology

## 2021-09-07 DIAGNOSIS — Z86010 Personal history of colonic polyps: Secondary | ICD-10-CM

## 2021-09-10 ENCOUNTER — Telehealth: Payer: Self-pay | Admitting: Gastroenterology

## 2021-09-10 NOTE — Telephone Encounter (Signed)
This message is being sent to you because Dr. Lyndel Safe is Doc of the Day for this afternoon.  This patient called wanting to schedule a colonoscopy.  He had one over 5 years ago in [place], [place].  He is on no BT, is not diabetic, and is having no specific GI issues at this time.  He does, however, have a prostate cancer diagnosis and his cancer doc wants to start him on chemotherapy.  If he does this first, then his colonoscopy will have to be postponed for approximately six months.  My question: does he need to come in for an office visit first, or can I schedule him a direct colonoscopy?  Please advise.  Thank you.

## 2021-09-11 NOTE — Telephone Encounter (Signed)
Pt was scheduled for an office visit with Ellouise Newer PA on 09/17/2021 @1 :30   Pt made aware Pt verbalized understanding with all questions answered

## 2021-09-14 ENCOUNTER — Encounter: Payer: Self-pay | Admitting: Family Medicine

## 2021-09-14 ENCOUNTER — Encounter: Payer: Self-pay | Admitting: Urology

## 2021-09-17 ENCOUNTER — Encounter: Payer: Self-pay | Admitting: Gastroenterology

## 2021-09-17 ENCOUNTER — Encounter: Payer: Self-pay | Admitting: Physician Assistant

## 2021-09-17 ENCOUNTER — Ambulatory Visit (INDEPENDENT_AMBULATORY_CARE_PROVIDER_SITE_OTHER): Payer: 59 | Admitting: Physician Assistant

## 2021-09-17 VITALS — BP 118/62 | HR 65 | Ht 70.0 in | Wt 165.0 lb

## 2021-09-17 DIAGNOSIS — Z86010 Personal history of colonic polyps: Secondary | ICD-10-CM | POA: Diagnosis not present

## 2021-09-17 DIAGNOSIS — C61 Malignant neoplasm of prostate: Secondary | ICD-10-CM | POA: Diagnosis not present

## 2021-09-17 MED ORDER — NA SULFATE-K SULFATE-MG SULF 17.5-3.13-1.6 GM/177ML PO SOLN: 1.0000 | Freq: Once | ORAL | 0 refills | Status: AC

## 2021-09-17 NOTE — Progress Notes (Signed)
Chief Complaint: Consultation for colonoscopy  HPI:    Samuel Powers is a 60 year old Caucasian male with a past medical history as listed below including anxiety, depression and a new diagnosis of prostate cancer, who was referred to me by Janora Norlander, DO for consideration of a colonoscopy.    05/19/2016 colonoscopy done for a personal and family history of colon polyps in [place] with internal hemorrhoids and otherwise normal.  Repeat was recommended in 5 years given his personal history and family history of colon polyps.    09/02/2021 was a MyChart message to patient's PCP in regards to colonoscopy.  Apparently the patient asked his radiation oncologist about need for colonoscopy in light of his upcoming seed implant and radiation procedure in December.  Recommended to go ahead and get the colonoscopy done before the procedure because otherwise he would need to wait a full 6 months after the procedure to have it done.    Today, the patient presents to clinic and tells me that he is doing very well.  He moved here a couple of years ago from [place] in order to take care of his aging parents. He was found to have an increasing PSA and recently had a biopsy showing cancer, so he is going to have radioactive seed placement in December and wants to have his colonoscopy prior to that.  Has a personal history of colon polyps and a family history of colon polyps and has been on a 5-year surveillance plan for colonoscopies.    Denies fever, chills, weight loss, change in bowels, abdominal pain, blood in his stool or weight loss.  Past Medical History:  Diagnosis Date   Allergy    Anxiety    Depression    Elevated PSA since june 2021   GERD (gastroesophageal reflux disease)    Hyperlipidemia    Sleep apnea    wears mouth guard cpap not tolerated, moderate osa    Past Surgical History:  Procedure Laterality Date   CYSTOSCOPY WITH LITHOLAPAXY N/A 08/14/2020   Procedure: CYSTOSCOPY  WITH REMOVAL OF BLADDER STONE;  Surgeon: Irine Seal, MD;  Location: [facility];  Service: Urology;  Laterality: N/A;   PROSTATE BIOPSY N/A 08/14/2020   Procedure: BIOPSY TRANSRECTAL ULTRASONIC PROSTATE (TUBP);  Surgeon: Irine Seal, MD;  Location: [facility];  Service: Urology;  Laterality: N/A;   TONSILLECTOMY Bilateral 2000    Current Outpatient Medications  Medication Sig Dispense Refill   alfuzosin (UROXATRAL) 10 MG 24 hr tablet Take 1 tablet (10 mg total) by mouth daily with breakfast. 90 tablet 3   Ascorbic Acid (VITAMIN C WITH ROSE HIPS) 500 MG tablet Take 500 mg by mouth daily.     cetirizine (ZYRTEC) 10 MG chewable tablet Chew 10 mg by mouth daily.     fluticasone (FLONASE) 50 MCG/ACT nasal spray Place into both nostrils daily.     montelukast (SINGULAIR) 10 MG tablet TAKE ONE TABLET AT BEDTIME 30 tablet 5   NONFORMULARY OR COMPOUNDED ITEM Antifungal solution: Terbinafine 3%, Fluconazole 2%, Tea Tree Oil 5%, Urea 10%, Ibuprofen 2% in DMSO suspension #77mL 1 each 3   rosuvastatin (CRESTOR) 10 MG tablet TAKE 1 TABLET AT BEDTIME 90 tablet 3   SF 5000 PLUS 1.1 % CREA dental cream Take by mouth.     No current facility-administered medications for this visit.    Allergies as of 09/17/2021 - Review Complete 09/03/2021  Allergen Reaction Noted   Codeine  01/01/2020  Family History  Problem Relation Age of Onset   COPD Mother    Diabetes Mother    Hypertension Mother    Alcohol abuse Father    COPD Brother     Social History   Socioeconomic History   Marital status: Single    Spouse name: Not on file   Number of children: Not on file   Years of education: Not on file   Highest education level: Not on file  Occupational History   Occupation: Engineer, maintenance (IT)  Tobacco Use   Smoking status: Never   Smokeless tobacco: Never  Vaping Use   Vaping Use: Never used  Substance and Sexual Activity   Alcohol use: Not Currently   Drug use: Never   Sexual  activity: Not on file  Other Topics Concern   Not on file  Social History Narrative   Recently relocated back home from [place] to be closer to his mother, who resides in [place].   Social Determinants of Health   Financial Resource Strain: Not on file  Food Insecurity: Not on file  Transportation Needs: Not on file  Physical Activity: Not on file  Stress: Not on file  Social Connections: Not on file  Intimate Partner Violence: Not on file    Review of Systems:    Constitutional: No weight loss, fever or chills Skin: No rash  Cardiovascular: No chest pain  Respiratory: No SOB Gastrointestinal: See HPI and otherwise negative Genitourinary: No dysuria  Neurological: No headache, dizziness or syncope Musculoskeletal: No new muscle or joint pain Hematologic: No bleeding  Psychiatric: No history of depression or anxiety   Physical Exam:  Vital signs: BP 118/62   Pulse 65   Ht 5\' 10"  (1.778 m)   Wt 165 lb (74.8 kg)   BMI 23.68 kg/m    Constitutional:   Pleasant Caucasian male appears to be in NAD, Well developed, Well nourished, alert and cooperative Head:  Normocephalic and atraumatic. Eyes:   PEERL, EOMI. No icterus. Conjunctiva pink. Ears:  Normal auditory acuity. Neck:  Supple Throat: Oral cavity and pharynx without inflammation, swelling or lesion.  Respiratory: Respirations even and unlabored. Lungs clear to auscultation bilaterally.   No wheezes, crackles, or rhonchi.  Cardiovascular: Normal S1, S2. No MRG. Regular rate and rhythm. No peripheral edema, cyanosis or pallor.  Gastrointestinal:  Soft, nondistended, nontender. No rebound or guarding. Normal bowel sounds. No appreciable masses or hepatomegaly. Rectal:  Not performed.  Msk:  Symmetrical without gross deformities. Without edema, no deformity or joint abnormality.  Neurologic:  Alert and  oriented x4;  grossly normal neurologically.  Skin:   Dry and intact without significant lesions or  rashes. Psychiatric: Demonstrates good judgement and reason without abnormal affect or behaviors.  RELEVANT LABS AND IMAGING: CBC    Component Value Date/Time   WBC 3.9 12/12/2020 1557   RBC 4.36 12/12/2020 1557   HGB 13.7 12/12/2020 1557   HGB 13.8 04/09/2020 0937   HCT 38.6 12/12/2020 1557   HCT 40.5 04/09/2020 0937   PLT 288 12/12/2020 1557   PLT 329 04/09/2020 0937   MCV 88.5 12/12/2020 1557   MCV 90 04/09/2020 0937   MCH 31.4 12/12/2020 1557   MCHC 35.5 12/12/2020 1557   RDW 12.6 12/12/2020 1557   RDW 12.3 04/09/2020 0937   LYMPHSABS 1,459 12/12/2020 1557   EOSABS 20 12/12/2020 1557   BASOSABS 51 12/12/2020 1557    CMP     Component Value Date/Time   NA 141 04/17/2021 0933  K 5.3 (H) 04/17/2021 0933   CL 102 04/17/2021 0933   CO2 26 04/17/2021 0933   GLUCOSE 89 04/17/2021 0933   GLUCOSE 101 (H) 08/14/2020 0624   BUN 14 04/17/2021 0933   CREATININE 0.90 04/17/2021 0933   CALCIUM 9.7 04/17/2021 0933   PROT 6.7 04/17/2021 0933   ALBUMIN 4.5 04/17/2021 0933   AST 17 04/17/2021 0933   ALT 14 04/17/2021 0933   ALKPHOS 56 04/17/2021 0933   BILITOT 0.9 04/17/2021 0933   GFRNONAA 92 04/09/2020 0937   GFRAA 106 04/09/2020 0937    Assessment: 1.  History of colon polyps: On his colonoscopy 10 years ago, last one in 2017 did not show polyps, but he was still recommended to follow-up in 5 years, I am assuming these were adenomatous but we do not have the first colonoscopy report, also family history of colon polyps in his brother 2.  Newly diagnosed prostate cancer: He is scheduled to undergo radioactive seed placement in early December and would like colonoscopy prior  Plan: 1.  Scheduled patient for surveillance colonoscopy due to his history of polyps in the [place] with Dr. Rush Landmark.  Did provide the patient a detailed list of risks for the procedure and he agrees to proceed. Patient is appropriate for endoscopic procedure(s) in the ambulatory ([place]) setting.  2.   Patient will follow in clinic per recommendations after time of procedure.  Ellouise Newer, PA-C [facility] 09/17/2021, 1:21 PM  Cc: Janora Norlander, DO

## 2021-09-17 NOTE — Patient Instructions (Signed)
You have been scheduled for a colonoscopy. Please follow written instructions given to you at your visit today.  Please pick up your prep supplies at the pharmacy within the next 1-3 days. If you use inhalers (even only as needed), please bring them with you on the day of your procedure.  If you are age 58 or older, your body mass index should be between 23-30. Your Body mass index is 23.68 kg/m. If this is out of the aforementioned range listed, please consider follow up with your Primary Care Provider.  If you are age 92 or younger, your body mass index should be between 19-25. Your Body mass index is 23.68 kg/m. If this is out of the aformentioned range listed, please consider follow up with your Primary Care Provider.   ________________________________________________________  The [facility] providers would like to encourage you to use [facility] to communicate with providers for non-urgent requests or questions.  Due to long hold times on the telephone, sending your provider a message by [facility] may be a faster and more efficient way to get a response.  Please allow 48 business hours for a response.  Please remember that this is for non-urgent requests.  _______________________________________________________

## 2021-09-18 NOTE — Progress Notes (Signed)
Attending Physician's Attestation   I have reviewed the chart.   I agree with the Advanced Practitioner's note, impression, and recommendations with any updates as below.    Koya Hunger Mansouraty, MD [facility] Advanced Endoscopy Office # [phone]  

## 2021-09-22 ENCOUNTER — Other Ambulatory Visit: Payer: Self-pay

## 2021-09-22 ENCOUNTER — Ambulatory Visit (AMBULATORY_SURGERY_CENTER): Payer: 59 | Admitting: Gastroenterology

## 2021-09-22 ENCOUNTER — Encounter: Payer: Self-pay | Admitting: Gastroenterology

## 2021-09-22 VITALS — BP 119/74 | HR 57 | Temp 98.0°F | Resp 17 | Ht 70.0 in | Wt 165.0 lb

## 2021-09-22 DIAGNOSIS — D127 Benign neoplasm of rectosigmoid junction: Secondary | ICD-10-CM

## 2021-09-22 DIAGNOSIS — D123 Benign neoplasm of transverse colon: Secondary | ICD-10-CM

## 2021-09-22 DIAGNOSIS — Z860100 Personal history of colon polyps, unspecified: Secondary | ICD-10-CM

## 2021-09-22 DIAGNOSIS — C61 Malignant neoplasm of prostate: Secondary | ICD-10-CM

## 2021-09-22 MED ORDER — SODIUM CHLORIDE 0.9 % IV SOLN
500.0000 mL | Freq: Once | INTRAVENOUS | Status: DC
Start: 2021-09-22 — End: 2021-09-22

## 2021-09-22 NOTE — Progress Notes (Signed)
GASTROENTEROLOGY PROCEDURE H&P NOTE   Primary Care Physician: Janora Norlander, DO  HPI: Samuel Powers is a 60 y.o. male who presents for Colonoscopy for surveillance in setting of prior colon polyps.  Last colonoscopy done outside of area was negative.  Past Medical History:  Diagnosis Date   Allergy    Anxiety    Depression    Elevated PSA since june 2021   GERD (gastroesophageal reflux disease)    Hyperlipidemia    Prostate cancer (Samuel Powers)    Sleep apnea    wears mouth guard cpap not tolerated, moderate osa   Past Surgical History:  Procedure Laterality Date   CYSTOSCOPY WITH LITHOLAPAXY N/A 08/14/2020   Procedure: CYSTOSCOPY WITH REMOVAL OF BLADDER STONE;  Surgeon: Irine Seal, MD;  Location: [facility];  Service: Urology;  Laterality: N/A;   PROSTATE BIOPSY N/A 08/14/2020   Procedure: BIOPSY TRANSRECTAL ULTRASONIC PROSTATE (TUBP);  Surgeon: Irine Seal, MD;  Location: [facility];  Service: Urology;  Laterality: N/A;   TONSILLECTOMY Bilateral 2000   Current Outpatient Medications  Medication Sig Dispense Refill   alfuzosin (UROXATRAL) 10 MG 24 hr tablet Take 1 tablet (10 mg total) by mouth daily with breakfast. 90 tablet 3   Ascorbic Acid (VITAMIN C WITH ROSE HIPS) 500 MG tablet Take 500 mg by mouth daily.     cetirizine (ZYRTEC) 10 MG chewable tablet Chew 10 mg by mouth daily.     fluticasone (FLONASE) 50 MCG/ACT nasal spray Place into both nostrils daily.     montelukast (SINGULAIR) 10 MG tablet TAKE ONE TABLET AT BEDTIME 30 tablet 5   rosuvastatin (CRESTOR) 10 MG tablet TAKE 1 TABLET AT BEDTIME 90 tablet 3   SF 5000 PLUS 1.1 % CREA dental cream Take by mouth.     No current facility-administered medications for this visit.    Current Outpatient Medications:    alfuzosin (UROXATRAL) 10 MG 24 hr tablet, Take 1 tablet (10 mg total) by mouth daily with breakfast., Disp: 90 tablet, Rfl: 3   Ascorbic Acid (VITAMIN C WITH ROSE HIPS) 500 MG  tablet, Take 500 mg by mouth daily., Disp: , Rfl:    cetirizine (ZYRTEC) 10 MG chewable tablet, Chew 10 mg by mouth daily., Disp: , Rfl:    fluticasone (FLONASE) 50 MCG/ACT nasal spray, Place into both nostrils daily., Disp: , Rfl:    montelukast (SINGULAIR) 10 MG tablet, TAKE ONE TABLET AT BEDTIME, Disp: 30 tablet, Rfl: 5   rosuvastatin (CRESTOR) 10 MG tablet, TAKE 1 TABLET AT BEDTIME, Disp: 90 tablet, Rfl: 3   SF 5000 PLUS 1.1 % CREA dental cream, Take by mouth., Disp: , Rfl:  Allergies  Allergen Reactions   Codeine     Weird dreams and light headed   Family History  Problem Relation Age of Onset   COPD Mother    Diabetes Mother    Hypertension Mother    Alcohol abuse Father    COPD Brother    Social History   Socioeconomic History   Marital status: Single    Spouse name: Not on file   Number of children: Not on file   Years of education: Not on file   Highest education level: Not on file  Occupational History   Occupation: Engineer, maintenance (IT)  Tobacco Use   Smoking status: Never   Smokeless tobacco: Never  Vaping Use   Vaping Use: Never used  Substance and Sexual Activity   Alcohol use: Not Currently   Drug  use: Never   Sexual activity: Not on file  Other Topics Concern   Not on file  Social History Narrative   Recently relocated back home from [place] to be closer to his mother, who resides in [place].   Social Determinants of Health   Financial Resource Strain: Not on file  Food Insecurity: Not on file  Transportation Needs: Not on file  Physical Activity: Not on file  Stress: Not on file  Social Connections: Not on file  Intimate Partner Violence: Not on file    Physical Exam: There were no vitals filed for this visit. There is no height or weight on file to calculate BMI. GEN: NAD EYE: Sclerae anicteric ENT: MMM CV: Non-tachycardic GI: Soft, NT/ND NEURO:  Alert & Oriented x 3  Lab Results: No results for input(s): WBC, HGB, HCT, PLT in the last 72  hours. BMET No results for input(s): NA, K, CL, CO2, GLUCOSE, BUN, CREATININE, CALCIUM in the last 72 hours. LFT No results for input(s): PROT, ALBUMIN, AST, ALT, ALKPHOS, BILITOT, BILIDIR, IBILI in the last 72 hours. PT/INR No results for input(s): LABPROT, INR in the last 72 hours.   Impression / Plan: This is a 60 y.o.male who presents for Colonoscopy for surveillance in setting of prior colon polyps.  Last colonoscopy done outside of area was negative.  The risks and benefits of endoscopic evaluation/treatment were discussed with the patient and/or family; these include but are not limited to the risk of perforation, infection, bleeding, missed lesions, lack of diagnosis, severe illness requiring hospitalization, as well as anesthesia and sedation related illnesses.  The patient's history has been reviewed, patient examined, no change in status, and deemed stable for procedure.  The patient and/or family is agreeable to proceed.    [name], MD [facility] Advanced Endoscopy Office # [phone]

## 2021-09-22 NOTE — Progress Notes (Signed)
Pt Drowsy. VSS. To PACU, report to RN. No anesthetic complications noted.  

## 2021-09-22 NOTE — Progress Notes (Signed)
VS completed by DT.  Pt's states no medical or surgical changes since previsit or office visit.  

## 2021-09-22 NOTE — Patient Instructions (Signed)
Thank your for allowing [place] to take care of your healthcare needs today.  Please input fiber in your diet.  Use FiberCon 1-2 tablets daily. Please review handouts on high-fiber diet, hemorrhoids, and polyps.   YOU HAD AN ENDOSCOPIC PROCEDURE TODAY AT [facility]:   Refer to the procedure report that was given to you for any specific questions about what was found during the examination.  If the procedure report does not answer your questions, please call your gastroenterologist to clarify.  If you requested that your care partner not be given the details of your procedure findings, then the procedure report has been included in a sealed envelope for you to review at your convenience later.  YOU SHOULD EXPECT: Some feelings of bloating in the abdomen. Passage of more gas than usual.  Walking can help get rid of the air that was put into your GI tract during the procedure and reduce the bloating. If you had a lower endoscopy (such as a colonoscopy or flexible sigmoidoscopy) you may notice spotting of blood in your stool or on the toilet paper. If you underwent a bowel prep for your procedure, you may not have a normal bowel movement for a few days.  Please Note:  You might notice some irritation and congestion in your nose or some drainage.  This is from the oxygen used during your procedure.  There is no need for concern and it should clear up in a day or so.  SYMPTOMS TO REPORT IMMEDIATELY:  Following lower endoscopy (colonoscopy or flexible sigmoidoscopy):  Excessive amounts of blood in the stool  Significant tenderness or worsening of abdominal pains  Swelling of the abdomen that is new, acute  Fever of 100F or higher  For urgent or emergent issues, a gastroenterologist can be reached at any hour by calling [phone]. Do not use MyChart messaging for urgent concerns.    DIET:  We do recommend a small meal at first, but then you may proceed to your regular diet.  Drink  plenty of fluids but you should avoid alcoholic beverages for 24 hours.  ACTIVITY:  You should plan to take it easy for the rest of today and you should NOT DRIVE or use heavy machinery until tomorrow (because of the sedation medicines used during the test).    FOLLOW UP: Our staff will call the number listed on your records 48-72 hours following your procedure to check on you and address any questions or concerns that you may have regarding the information given to you following your procedure. If we do not reach you, we will leave a message.  We will attempt to reach you two times.  During this call, we will ask if you have developed any symptoms of COVID 19. If you develop any symptoms (ie: fever, flu-like symptoms, shortness of breath, cough etc.) before then, please call [phone].  If you test positive for Covid 19 in the 2 weeks post procedure, please call and report this information to [place].    If any biopsies were taken you will be contacted by phone or by letter within the next 1-3 weeks.  Please call us at [phone] if you have not heard about the biopsies in 3 weeks.    SIGNATURES/CONFIDENTIALITY: You and/or your care partner have signed paperwork which will be entered into your electronic medical record.  These signatures attest to the fact that that the information above on your After Visit Summary has been reviewed and is  understood.  Full responsibility of the confidentiality of this discharge information lies with you and/or your care-partner.

## 2021-09-22 NOTE — Progress Notes (Signed)
Called to room to assist during endoscopic procedure.  Patient ID and intended procedure confirmed with present staff. Received instructions for my participation in the procedure from the performing physician.  

## 2021-09-22 NOTE — Op Note (Signed)
[facility] Patient Name: Samuel Powers Procedure Date: 09/22/2021 1:53 PM MRN: [id number] Endoscopist: [name] , MD Age: 60 Referring MD:  Date of Birth: 1961/10/18 Gender: Male Account #: [id number] Procedure:                Colonoscopy Indications:              High risk colon cancer surveillance: Personal                            history of colonic polyps (last colonoscopy in 2017                            negative for polyps) Medicines:                Monitored Anesthesia Care Procedure:                Pre-Anesthesia Assessment:                           - Prior to the procedure, a History and Physical                            was performed, and patient medications and                            allergies were reviewed. The patient's tolerance of                            previous anesthesia was also reviewed. The risks                            and benefits of the procedure and the sedation                            options and risks were discussed with the patient.                            All questions were answered, and informed consent                            was obtained. Prior Anticoagulants: The patient has                            taken no previous anticoagulant or antiplatelet                            agents. ASA Grade Assessment: II - A patient with                            mild systemic disease. After reviewing the risks                            and benefits, the patient was deemed in  satisfactory condition to undergo the procedure.                           After obtaining informed consent, the colonoscope                            was passed under direct vision. Throughout the                            procedure, the patient's blood pressure, pulse, and                            oxygen saturations were monitored continuously. The                            CF [id number] #[id number] was introduced  through the anus                            and advanced to the 5 cm into the ileum. The                            colonoscopy was performed without difficulty. The                            patient tolerated the procedure. The quality of the                            bowel preparation was good. The terminal ileum,                            ileocecal valve, appendiceal orifice, and rectum                            were photographed. Scope In: 2:12:31 PM Scope Out: 2:25:46 PM Scope Withdrawal Time: 0 hours 10 minutes 24 seconds  Total Procedure Duration: 0 hours 13 minutes 15 seconds  Findings:                 The digital rectal exam findings include                            hemorrhoids. Pertinent negatives include no                            palpable rectal lesions.                           The terminal ileum and ileocecal valve appeared                            normal.                           Three sessile polyps were found in the  recto-sigmoid colon (1) and transverse colon (2).                            The polyps were 2 to 6 mm in size. These polyps                            were removed with a cold snare. Resection and                            retrieval were complete.                           Normal mucosa was found in the entire colon                            otherwise.                           Non-bleeding non-thrombosed external and internal                            hemorrhoids were found during retroflexion, during                            perianal exam and during digital exam. The                            hemorrhoids were Grade II (internal hemorrhoids                            that prolapse but reduce spontaneously). Complications:            No immediate complications. Estimated Blood Loss:     Estimated blood loss was minimal. Impression:               - Hemorrhoids found on digital rectal exam.                            - The examined portion of the ileum was normal.                           - Three 2 to 6 mm polyps at the recto-sigmoid colon                            and in the transverse colon, removed with a cold                            snare. Resected and retrieved.                           - Normal mucosa in the entire examined colon                            otherwise.                           -  Non-bleeding non-thrombosed external and internal                            hemorrhoids. Recommendation:           - The patient will be observed post-procedure,                            until all discharge criteria are met.                           - Discharge patient to home.                           - Patient has a contact number available for                            emergencies. The signs and symptoms of potential                            delayed complications were discussed with the                            patient. Return to normal activities tomorrow.                            Written discharge instructions were provided to the                            patient.                           - High fiber diet.                           - Use FiberCon 1-2 tablets PO daily.                           - Continue present medications.                           - Await pathology results.                           - Repeat colonoscopy in 3/5/7 years for                            surveillance based on pathology results.                           - The findings and recommendations were discussed                            with the patient. [name], MD 09/22/2021 2:34:22 PM

## 2021-09-24 ENCOUNTER — Telehealth: Payer: Self-pay

## 2021-09-24 NOTE — Telephone Encounter (Signed)
  Follow up Call-  Call back number 09/22/2021  Post procedure Call Back phone  # [phone]  Permission to leave phone message Yes  Some recent data might be hidden     Patient questions:  Do you have a fever, pain , or abdominal swelling? No. Pain Score  0 *  Have you tolerated food without any problems? Yes  Have you been able to return to your normal activities? Yes.    Do you have any questions about your discharge instructions: Diet   No. Medications  No. Follow up visit  No.  Do you have questions or concerns about your Care? No.  Actions: * If pain score is 4 or above: No action needed, pain <4. Have you developed a fever since your procedure? no  2.   Have you had an respiratory symptoms (SOB or cough) since your procedure? no  3.   Have you tested positive for COVID 19 since your procedure no  4.   Have you had any family members/close contacts diagnosed with the COVID 19 since your procedure?  no   If yes to any of these questions please route to Joylene John, RN

## 2021-09-27 HISTORY — PX: COLONOSCOPY: SHX174

## 2021-09-29 ENCOUNTER — Encounter: Payer: Self-pay | Admitting: Gastroenterology

## 2021-10-05 ENCOUNTER — Telehealth: Payer: Self-pay | Admitting: *Deleted

## 2021-10-05 NOTE — Telephone Encounter (Signed)
Called patient to remind of lab appt. for 10-09-21, spoke with patient and he is aware of this appt.

## 2021-10-06 ENCOUNTER — Encounter: Payer: Self-pay | Admitting: Urology

## 2021-10-07 ENCOUNTER — Encounter (HOSPITAL_BASED_OUTPATIENT_CLINIC_OR_DEPARTMENT_OTHER): Payer: Self-pay | Admitting: Urology

## 2021-10-07 ENCOUNTER — Other Ambulatory Visit: Payer: Self-pay

## 2021-10-07 DIAGNOSIS — Z973 Presence of spectacles and contact lenses: Secondary | ICD-10-CM

## 2021-10-07 HISTORY — DX: Presence of spectacles and contact lenses: Z97.3

## 2021-10-07 NOTE — Progress Notes (Addendum)
Spoke w/ via phone for pre-op interview---PT Lab needs dos----  none             Lab results------ekg 09-03-2021 chart/epic, chest xray 09-03-2021 chart/epic COVID test ----pt took home covid test 10-06-2021 due to mother sick, result was negative, pt to let pre op nurse know if mother tests positive for covid and a covid test will be arranged for pt if needed.   Addendum: spoke with pt on 10-08-2021 and his mother tested covid negative and negative for flu, pt states he has no symptoms at this time and is staying away from his mother and plans on having 10-13-2021 surgery barring any acute status changes, pt given denise sexton rn phone number if he has any acute status changes prior to 10-13-2021 surgery.  Arrive at -------800 am 10-13-2021 NPO after MN NO Solid Food.  Clear liquids from MN until---700 am Med rec completed Medications to take morning of surgery -----alfuzosin, flonase nasal spray Diabetic medication -----n/a Patient instructed to bring photo id and insurance card day of surgery Patient aware to have Driver (ride ) / caregiver    for 24 hours after surgery  ? Brother, pt aware and will arrange driver/caregiver if brother is unavailable due to mother sick Patient Special Instructions -----fleets enema am of surgery Pre-Op special Istructions -----none Patient verbalized understanding of instructions that were given at this phone interview. Patient denies shortness of breath, chest pain, fever, cough at this phone interview.

## 2021-10-09 ENCOUNTER — Encounter (HOSPITAL_COMMUNITY): Payer: Self-pay

## 2021-10-09 ENCOUNTER — Inpatient Hospital Stay (HOSPITAL_COMMUNITY)
Admission: RE | Admit: 2021-10-09 | Discharge: 2021-10-09 | Disposition: A | Discharge status: Home / Self Care | Payer: 59 | Source: Ambulatory Visit

## 2021-10-12 ENCOUNTER — Telehealth: Payer: Self-pay | Admitting: *Deleted

## 2021-10-12 NOTE — H&P (Signed)
Pt presents today for pre-operative history and physical exam in anticipation of radioactive seed and space oar placement by Dr. Jeffie Pollock on 10/13/21. He is doing well and is without complaint.   Pt denies F/C, HA, CP, SOB, N/V, diarrhea/constipation, back pain, flank pain, hematuria, and dysuria.      HX:   CC: Prostate Cancer   Physician requesting consult: Dr. Irine Seal  PCP: Dr. Adam Phenix  Location of consult: [facility] - [facility]   [name] is a 60 year old gentleman who presented to Dr. Jeffie Pollock last year with gross hematuria. He was found to have a bladder stone and a PSA of 3.8 prompting him to undergo removal of his bladder stone cystoscopically along with a prostate biopsy on 08/28/20 that was negative for malignancy. He was placed on alfuzosin for his BPH considering his baseline symptoms and bladder outlet obstruction evidenced by his bladder stone formation. His PSA was followed and began rising to 5.4 and then to 6.9. He therefore underwent an MRI of the prostate on 05/02/21 that demonstrated a 1.5 cm PI-RADS 4 lesion at the right anterior transition zone. This led to an MR/US fusion biopsy that demonstrated Gleason 3+4=7 adenocarcinoma with 8 out of 15 biopsy cores positive including 3 out of 3 targeted lesions. His voiding symptoms have improved on alpha blocker therapy but he does have a prominent intravesical median lobe.   Family history: None.   Imaging studies: MRI (05/02/21): No EPE, SVI, LAD, or bone lesions.   PMH: He has a history of depression, anxiety, GERD, hyperlipidemia, and obesity.  PSH: No abdominal surgeries.   TNM stage: cT1c N0 Mx  PSA: 6.9  Gleason score: 3+4=7 (GG 2)  Biopsy (05/12/21): 8/15 cores positive  Left: L apex (10%, 3+3=6)  Right: R apex (10%, 3+4=7), R lateral apex (10%, 3+3=6), R mid (20%, 3+4=7), R lateral mid (10%, 3+3=6)  MR target: 3/3 cores (2 cores with Gleason 3+4=7 - 80%, 50%, 1 core  with Gleason 3+3=6, 50%)  Prostate volume: 26.0 cc   Nomogram  OC disease: 55%  EPE: 43%  SVI: 5%  LNI: 5%  PFS (5 year, 10 year): 80%, 68%   Urinary function: IPSS is 8. He remains on alfuzosin and this has been helpful.  Erectile function: SHIM score is 25.     ALLERGIES: Codeine - disoriented    MEDICATIONS: Zyrtec  Alfuzosin Hcl Er 10 mg tablet, extended release 24 hr  Flonase Allergy Relief  Magnesium  Melatonin  Rosuvastatin Calcium 10 mg tablet  Vitamin D     Notes: singulair QD   GU PSH: Cysto Remove Stent FB Sim - 08/14/2020 Prostate Needle Biopsy - 06/01/2021, 08/14/2020       PSH Notes: Bladder stones removal   NON-GU PSH: Surgical Pathology, Gross And Microscopic Examination For Prostate Needle - 06/01/2021 Tonsillectomy     GU PMH: Prostate Cancer - 07/17/2021 Abnormal radiologic findings on diagnostic imaging of other urinary organs - 06/01/2021 Elevated PSA - 06/01/2021    NON-GU PMH: Anxiety Depression GERD Hypercholesterolemia Sleep Apnea    FAMILY HISTORY: Chronic Obstructive Pulmonary Disease - Runs in Family Cirrhosis - Father Diabetes - Mother Hypercholesterolemia - Mother   SOCIAL HISTORY: Marital Status: Single Ethnicity: Not Hispanic Or Latino; Race: White Current Smoking Status: Patient has never smoked.   Tobacco Use Assessment Completed: Used Tobacco in last 30 days? Does not use smokeless tobacco. Has never drank.  Does not use drugs. Drinks 2  caffeinated drinks per day. Has not had a blood transfusion.    REVIEW OF SYSTEMS:    GU Review Male:   Patient denies frequent urination, hard to postpone urination, burning/ pain with urination, get up at night to urinate, leakage of urine, stream starts and stops, trouble starting your stream, have to strain to urinate , erection problems, and penile pain.  Gastrointestinal (Upper):   Patient denies nausea, vomiting, and indigestion/ heartburn.  Gastrointestinal (Lower):   Patient  denies diarrhea and constipation.  Constitutional:   Patient denies night sweats, weight loss, fever, and fatigue.  Skin:   Patient denies skin rash/ lesion and itching.  Eyes:   Patient denies blurred vision and double vision.  Ears/ Nose/ Throat:   Patient denies sore throat and sinus problems.  Hematologic/Lymphatic:   Patient denies swollen glands and easy bruising.  Cardiovascular:   Patient denies leg swelling and chest pains.  Respiratory:   Patient denies cough and shortness of breath.  Endocrine:   Patient denies excessive thirst.  Musculoskeletal:   Patient denies back pain and joint pain.  Neurological:   Patient denies headaches and dizziness.  Psychologic:   Patient denies depression and anxiety.   VITAL SIGNS:      10/06/2021 09:19 AM  Weight 160 lb / 72.57 kg  Height 70.5 in / 179.07 cm  BP 144/85 mmHg  Pulse 73 /min  Temperature 97.8 F / 36.5 C  BMI 22.6 kg/m   MULTI-SYSTEM PHYSICAL EXAMINATION:    Constitutional: Well-nourished. No physical deformities. Normally developed. Good grooming.  Neck: Neck symmetrical, not swollen. Normal tracheal position.  Respiratory: Normal breath sounds. No labored breathing, no use of accessory muscles.   Cardiovascular: Regular rate and rhythm. No murmur, no gallop.   Lymphatic: No enlargement of neck, axillae, groin.  Skin: No paleness, no jaundice, no cyanosis. No lesion, no ulcer, no rash.  Neurologic / Psychiatric: Oriented to time, oriented to place, oriented to person. No depression, no anxiety, no agitation.  Gastrointestinal: No mass, no tenderness, no rigidity, non obese abdomen.  Eyes: Normal conjunctivae. Normal eyelids.  Ears, Nose, Mouth, and Throat: Left ear no scars, no lesions, no masses. Right ear no scars, no lesions, no masses. Nose no scars, no lesions, no masses. Normal hearing. Normal lips.  Musculoskeletal: Normal gait and station of head and neck.     Complexity of Data:  Records Review:   Previous  Patient Records  Urine Test Review:   Urinalysis   10/06/21  Urinalysis  Urine Appearance Clear   Urine Color Yellow   Urine Glucose Neg mg/dL  Urine Bilirubin Neg mg/dL  Urine Ketones Neg mg/dL  Urine Specific Gravity 1.025   Urine Blood Neg ery/uL  Urine pH 6.0   Urine Protein Neg mg/dL  Urine Urobilinogen 0.2 mg/dL  Urine Nitrites Neg   Urine Leukocyte Esterase Neg leu/uL   PROCEDURES:          Urinalysis - 81003 Dipstick Dipstick Cont'd  Color: Yellow Bilirubin: Neg mg/dL  Appearance: Clear Ketones: Neg mg/dL  Specific Gravity: 1.025 Blood: Neg ery/uL  pH: 6.0 Protein: Neg mg/dL  Glucose: Neg mg/dL Urobilinogen: 0.2 mg/dL    Nitrites: Neg    Leukocyte Esterase: Neg leu/uL    ASSESSMENT:      ICD-10 Details  1 GU:   Prostate Cancer - C61    PLAN:           Schedule Return Visit/Planned Activity: Keep Scheduled Appointment - Schedule Surgery  Document Letter(s):  Created for Patient: Clinical Summary         Notes:   There are no changes in the patients history or physical exam since last evaluation by Dr. Jeffie Pollock. Pt is scheduled to undergo seed and space oar placement on 10/13/21.   All pt's questions were answered to the best of my ability.          Next Appointment:      Next Appointment: 10/13/2021 10:00 AM    Appointment Type: Surgery     Location: [facility] [phone]    Provider: Irine Seal, M.D.    Reason for Visit: [place]

## 2021-10-12 NOTE — Telephone Encounter (Signed)
CALLED PATIENT TO REMIND OF PROCEDURE FOR 10-13-21, SPOKE WITH PATIENT AND HE IS AWARE OF THIS PROCEDURE

## 2021-10-13 ENCOUNTER — Encounter (HOSPITAL_BASED_OUTPATIENT_CLINIC_OR_DEPARTMENT_OTHER): Payer: Self-pay | Admitting: Urology

## 2021-10-13 ENCOUNTER — Encounter (HOSPITAL_BASED_OUTPATIENT_CLINIC_OR_DEPARTMENT_OTHER)
Admission: RE | Disposition: A | Discharge status: Home / Self Care | Payer: Self-pay | Source: Ambulatory Visit | Attending: Urology

## 2021-10-13 ENCOUNTER — Other Ambulatory Visit: Payer: Self-pay

## 2021-10-13 ENCOUNTER — Ambulatory Visit (HOSPITAL_COMMUNITY): Payer: 59

## 2021-10-13 ENCOUNTER — Ambulatory Visit (HOSPITAL_BASED_OUTPATIENT_CLINIC_OR_DEPARTMENT_OTHER): Payer: 59 | Admitting: Anesthesiology

## 2021-10-13 ENCOUNTER — Ambulatory Visit (HOSPITAL_BASED_OUTPATIENT_CLINIC_OR_DEPARTMENT_OTHER)
Admission: RE | Admit: 2021-10-13 | Discharge: 2021-10-13 | Disposition: A | Discharge status: Home / Self Care | Payer: 59 | Source: Ambulatory Visit | Attending: Urology | Admitting: Urology

## 2021-10-13 DIAGNOSIS — C61 Malignant neoplasm of prostate: Secondary | ICD-10-CM | POA: Insufficient documentation

## 2021-10-13 DIAGNOSIS — N210 Calculus in bladder: Secondary | ICD-10-CM | POA: Diagnosis not present

## 2021-10-13 DIAGNOSIS — G4730 Sleep apnea, unspecified: Secondary | ICD-10-CM | POA: Insufficient documentation

## 2021-10-13 HISTORY — PX: RADIOACTIVE SEED IMPLANT: SHX5150

## 2021-10-13 HISTORY — PX: SPACE OAR INSTILLATION: SHX6769

## 2021-10-13 HISTORY — PX: CYSTOSCOPY: SHX5120

## 2021-10-13 SURGERY — INSERTION, RADIATION SOURCE, PROSTATE
Anesthesia: General | Site: Rectum

## 2021-10-13 MED ORDER — SODIUM CHLORIDE (PF) 0.9 % IJ SOLN
INTRAMUSCULAR | Status: DC | PRN
  Administered 2021-10-13: 10 mL

## 2021-10-13 MED ORDER — SODIUM CHLORIDE 0.9 % IR SOLN
Status: DC | PRN
  Administered 2021-10-13: 1000 mL

## 2021-10-13 MED ORDER — SODIUM CHLORIDE 0.9% FLUSH: 3.0000 mL | Freq: Two times a day (BID) | INTRAVENOUS | Status: DC

## 2021-10-13 MED ORDER — FENTANYL CITRATE (PF) 100 MCG/2ML IJ SOLN: 25.0000 ug | INTRAMUSCULAR | Status: DC | PRN

## 2021-10-13 MED ORDER — FLEET ENEMA 7-19 GM/118ML RE ENEM
1.0000 | ENEMA | Freq: Once | RECTAL | Status: DC
Start: 2021-10-14 — End: 2021-10-13

## 2021-10-13 MED ORDER — ONDANSETRON HCL 4 MG/2ML IJ SOLN
INTRAMUSCULAR | Status: DC | PRN
  Administered 2021-10-13: 4 mg via INTRAVENOUS

## 2021-10-13 MED ORDER — FENTANYL CITRATE (PF) 100 MCG/2ML IJ SOLN
INTRAMUSCULAR | Status: AC
  Filled 2021-10-13: qty 2

## 2021-10-13 MED ORDER — PROPOFOL 10 MG/ML IV BOLUS
INTRAVENOUS | Status: AC
  Filled 2021-10-13: qty 20

## 2021-10-13 MED ORDER — FENTANYL CITRATE (PF) 100 MCG/2ML IJ SOLN
INTRAMUSCULAR | Status: DC | PRN
  Administered 2021-10-13: 25 ug via INTRAVENOUS
  Administered 2021-10-13: 50 ug via INTRAVENOUS

## 2021-10-13 MED ORDER — DEXAMETHASONE SODIUM PHOSPHATE 10 MG/ML IJ SOLN
INTRAMUSCULAR | Status: DC | PRN
  Administered 2021-10-13: 10 mg via INTRAVENOUS

## 2021-10-13 MED ORDER — PROPOFOL 10 MG/ML IV BOLUS
INTRAVENOUS | Status: DC | PRN
  Administered 2021-10-13: 150 mg via INTRAVENOUS

## 2021-10-13 MED ORDER — ONDANSETRON HCL 4 MG/2ML IJ SOLN
INTRAMUSCULAR | Status: AC
  Filled 2021-10-13: qty 2

## 2021-10-13 MED ORDER — KETOROLAC TROMETHAMINE 30 MG/ML IJ SOLN
INTRAMUSCULAR | Status: DC | PRN
Start: 2021-10-13 — End: 2021-10-13
  Administered 2021-10-13: 30 mg via INTRAVENOUS

## 2021-10-13 MED ORDER — ROCURONIUM BROMIDE 10 MG/ML (PF) SYRINGE
PREFILLED_SYRINGE | INTRAVENOUS | Status: AC
  Filled 2021-10-13: qty 10

## 2021-10-13 MED ORDER — MIDAZOLAM HCL 2 MG/2ML IJ SOLN
INTRAMUSCULAR | Status: AC
  Filled 2021-10-13: qty 2

## 2021-10-13 MED ORDER — LIDOCAINE 2% (20 MG/ML) 5 ML SYRINGE
INTRAMUSCULAR | Status: AC
  Filled 2021-10-13: qty 5

## 2021-10-13 MED ORDER — DEXMEDETOMIDINE (PRECEDEX) IN NS 20 MCG/5ML (4 MCG/ML) IV SYRINGE
PREFILLED_SYRINGE | INTRAVENOUS | Status: AC
  Filled 2021-10-13: qty 5

## 2021-10-13 MED ORDER — KETOROLAC TROMETHAMINE 30 MG/ML IJ SOLN
INTRAMUSCULAR | Status: AC
  Filled 2021-10-13: qty 1

## 2021-10-13 MED ORDER — CEFAZOLIN SODIUM-DEXTROSE 2-4 GM/100ML-% IV SOLN
INTRAVENOUS | Status: AC
  Filled 2021-10-13: qty 100

## 2021-10-13 MED ORDER — LIDOCAINE 2% (20 MG/ML) 5 ML SYRINGE
INTRAMUSCULAR | Status: DC | PRN
Start: 2021-10-13 — End: 2021-10-13
  Administered 2021-10-13: 100 mg via INTRAVENOUS

## 2021-10-13 MED ORDER — STERILE WATER FOR IRRIGATION IR SOLN
Status: DC | PRN
  Administered 2021-10-13: 500 mL

## 2021-10-13 MED ORDER — IOHEXOL 300 MG/ML  SOLN
INTRAMUSCULAR | Status: DC | PRN
  Administered 2021-10-13: 7 mL

## 2021-10-13 MED ORDER — MIDAZOLAM HCL 5 MG/5ML IJ SOLN
INTRAMUSCULAR | Status: DC | PRN
Start: 2021-10-13 — End: 2021-10-13
  Administered 2021-10-13: 2 mg via INTRAVENOUS

## 2021-10-13 MED ORDER — ROCURONIUM BROMIDE 10 MG/ML (PF) SYRINGE
PREFILLED_SYRINGE | INTRAVENOUS | Status: DC | PRN
Start: 2021-10-13 — End: 2021-10-13
  Administered 2021-10-13: 50 mg via INTRAVENOUS

## 2021-10-13 MED ORDER — LACTATED RINGERS IV SOLN: INTRAVENOUS | Status: DC

## 2021-10-13 MED ORDER — DEXAMETHASONE SODIUM PHOSPHATE 10 MG/ML IJ SOLN
INTRAMUSCULAR | Status: AC
  Filled 2021-10-13: qty 1

## 2021-10-13 MED ORDER — SUGAMMADEX SODIUM 200 MG/2ML IV SOLN
INTRAVENOUS | Status: DC | PRN
Start: 2021-10-13 — End: 2021-10-13
  Administered 2021-10-13: 150 mg via INTRAVENOUS

## 2021-10-13 MED ORDER — CEFAZOLIN SODIUM-DEXTROSE 2-4 GM/100ML-% IV SOLN
2.0000 g | INTRAVENOUS | Status: AC
  Administered 2021-10-13: 2 g via INTRAVENOUS

## 2021-10-13 MED ORDER — HYDROCODONE-ACETAMINOPHEN 5-325 MG PO TABS: 1.0000 | ORAL_TABLET | Freq: Four times a day (QID) | ORAL | 0 refills | Status: DC | PRN

## 2021-10-13 SURGICAL SUPPLY — 45 items
BAG DRN RND TRDRP ANRFLXCHMBR (UROLOGICAL SUPPLIES) ×3
BAG URINE DRAIN 2000ML AR STRL (UROLOGICAL SUPPLIES) ×4 IMPLANT
BLADE CLIPPER SENSICLIP SURGIC (BLADE) ×4 IMPLANT
BRACHTTHERAPY QUICKLINK ×260 IMPLANT
CATH FOLEY 2WAY SLVR  5CC 16FR (CATHETERS) ×4
CATH FOLEY 2WAY SLVR 5CC 16FR (CATHETERS) ×3 IMPLANT
CATH ROBINSON RED A/P 16FR (CATHETERS) IMPLANT
CATH ROBINSON RED A/P 20FR (CATHETERS) ×4 IMPLANT
CLOTH BEACON ORANGE TIMEOUT ST (SAFETY) ×4 IMPLANT
COVER BACK TABLE 60X90IN (DRAPES) ×4 IMPLANT
COVER MAYO STAND STRL (DRAPES) ×4 IMPLANT
DRSG TEGADERM 4X4.75 (GAUZE/BANDAGES/DRESSINGS) ×4 IMPLANT
DRSG TEGADERM 8X12 (GAUZE/BANDAGES/DRESSINGS) ×4 IMPLANT
GAUZE SPONGE 4X4 12PLY STRL (GAUZE/BANDAGES/DRESSINGS) ×4 IMPLANT
GEL ULTRASOUND 20GR AQUASONIC (MISCELLANEOUS) IMPLANT
GLOVE SURG ENC MOIS LTX SZ6.5 (GLOVE) ×4 IMPLANT
GLOVE SURG ENC MOIS LTX SZ7.5 (GLOVE) IMPLANT
GLOVE SURG ENC MOIS LTX SZ8 (GLOVE) IMPLANT
GLOVE SURG ORTHO LTX SZ8.5 (GLOVE) ×16 IMPLANT
GLOVE SURG POLYISO LF SZ7 (GLOVE) ×16 IMPLANT
GLOVE SURG POLYISO LF SZ8 (GLOVE) ×8 IMPLANT
GLOVE SURG UNDER POLY LF SZ6.5 (GLOVE) ×4 IMPLANT
GLOVE SURG UNDER POLY LF SZ7.5 (GLOVE) ×4 IMPLANT
GOWN STRL REUS W/TWL LRG LVL3 (GOWN DISPOSABLE) ×4 IMPLANT
GOWN STRL REUS W/TWL XL LVL3 (GOWN DISPOSABLE) ×4 IMPLANT
GRID BRACH TEMP 18GA 2.8X3X.75 (MISCELLANEOUS) ×4 IMPLANT
HOLDER FOLEY CATH W/STRAP (MISCELLANEOUS) IMPLANT
IMPL SPACEOAR VUE SYSTEM (Spacer) ×3 IMPLANT
IMPLANT SPACEOAR VUE SYSTEM (Spacer) ×4 IMPLANT
IV NS 1000ML (IV SOLUTION) ×4
IV NS 1000ML BAXH (IV SOLUTION) ×3 IMPLANT
KIT TURNOVER CYSTO (KITS) ×4 IMPLANT
NEEDLE BRACHY 18G 5PK (NEEDLE) ×16 IMPLANT
NEEDLE BRACHY 18G SINGLE (NEEDLE) IMPLANT
NEEDLE PK MORGANSTERN STABILIZ (NEEDLE) ×4 IMPLANT
PACK CYSTO (CUSTOM PROCEDURE TRAY) ×4 IMPLANT
SHEATH ULTRASOUND LF (SHEATH) IMPLANT
SHEATH ULTRASOUND LTX NONSTRL (SHEATH) ×4 IMPLANT
SURGILUBE 2OZ TUBE FLIPTOP (MISCELLANEOUS) IMPLANT
SUT BONE WAX W31G (SUTURE) IMPLANT
SYR 10ML LL (SYRINGE) IMPLANT
TOWEL OR 17X26 10 PK STRL BLUE (TOWEL DISPOSABLE) ×4 IMPLANT
UNDERPAD 30X36 HEAVY ABSORB (UNDERPADS AND DIAPERS) ×8 IMPLANT
WATER STERILE IRR 3000ML UROMA (IV SOLUTION) ×4 IMPLANT
WATER STERILE IRR 500ML POUR (IV SOLUTION) ×4 IMPLANT

## 2021-10-13 NOTE — Op Note (Signed)
PATIENT:  Samuel Powers  PRE-OPERATIVE DIAGNOSIS:  Adenocarcinoma of the prostate  POST-OPERATIVE DIAGNOSIS:  Same  PROCEDURE:  Procedure(s): 1. I-125 radioactive seed implantation 2. SpaceOAR implantation. 3.  Cystoscopy  SURGEON:  Surgeon(s): Irine Seal MD  Radiation oncologist: Dr. Tyler Pita  ANESTHESIA:  General  EBL:  Minimal  DRAINS: 65 French Foley catheter  INDICATION: Samuel Powers is a 60 y.o. with Stage T1c, Gleason 7(3+4) prostate cancer who has elected brachytherapy for treatment.  Description of procedure: After informed consent the patient was brought to the major OR, placed on the table and administered general anesthesia. He was then moved to the modified lithotomy position with his perineum perpendicular to the floor. His perineum and genitalia were then sterilely prepped. An official timeout was then performed. A 16 French Foley catheter was then placed in the bladder and filled with dilute contrast, a rectal tube was placed in the rectum and the transrectal ultrasound probe was placed in the rectum and affixed to the stand. He was then sterilely draped.  The sterile grid was installed.   Anchor needles were then placed.   Real time ultrasonography was used along with the seed planning software spot-pro version 3.1-00. This was used to develop the seed plan including the number of needles as well as number of seeds required for complete and adequate coverage. Real-time ultrasonography was then used along with the previously developed plan  to implant a total of 65 seeds using 19 needles for a target dose of 145 Gy. This proceeded without difficulty or complication.  The anchor needles and guide were removed and the SpaceOAR needle was passed under US guidance into the fat stripe posterior to the prostate with the tip in the midline at mid prostate. A puff of NS confirmed appropriate positioning and then additional saline was injected to open the space.  The  [facility] polymer was then injected over 12 seconds into the space with excellent distribution.     A Foley catheter was then removed as well as the transrectal ultrasound probe and rectal probe. Flexible cystoscopy was then performed using the 17 French flexible scope which revealed a normal urethra throughout its length down to the sphincter which appeared intact. The prostatic urethra was short with bilobar hyperplasia with coaptation. The bladder was then entered and fully and systematically inspected.  The ureteral orifices were noted to be of normal configuration and position. The mucosa revealed no evidence of tumors. There were also no stones identified within the bladder.  No seeds or spacers were seen and/or removed from the bladder.  The cystoscope was then removed.  The drapes were removed.  The perineum was cleaned and dressed.  He was taken out of the lithotomy position and was awakened and taken to recovery room in stable and satisfactory condition. He tolerated procedure well and there were no intraoperative complications.

## 2021-10-13 NOTE — Anesthesia Procedure Notes (Signed)
Procedure Name: Intubation Date/Time: 10/13/2021 10:21 AM Performed by: Bonney Aid, CRNA Pre-anesthesia Checklist: Patient identified, Emergency Drugs available, Suction available and Patient being monitored Patient Re-evaluated:Patient Re-evaluated prior to induction Oxygen Delivery Method: Circle system utilized Preoxygenation: Pre-oxygenation with 100% oxygen Induction Type: IV induction Ventilation: Mask ventilation without difficulty Laryngoscope Size: Mac and 4 Tube type: Oral Tube size: 7.5 mm Number of attempts: 1 Airway Equipment and Method: Stylet Placement Confirmation: ETT inserted through vocal cords under direct vision, positive ETCO2 and breath sounds checked- equal and bilateral Secured at: 22 cm Tube secured with: Tape Dental Injury: Teeth and Oropharynx as per pre-operative assessment

## 2021-10-13 NOTE — Anesthesia Postprocedure Evaluation (Signed)
Anesthesia Post Note  Patient: Samuel Powers  Procedure(s) Performed: RADIOACTIVE SEED IMPLANT/BRACHYTHERAPY IMPLANT (Prostate) SPACE OAR INSTILLATION (Rectum) CYSTOSCOPY FLEXIBLE (Bladder)     Patient location during evaluation: PACU Anesthesia Type: General Level of consciousness: awake Pain management: pain level controlled Vital Signs Assessment: post-procedure vital signs reviewed and stable Respiratory status: spontaneous breathing Postop Assessment: no apparent nausea or vomiting Anesthetic complications: no   No notable events documented.  Last Vitals:  Vitals:   10/13/21 1133 10/13/21 1145  BP: 126/74 129/84  Pulse: 75 76  Resp: 12 18  Temp: 36.9 C   SpO2: 100% 98%    Last Pain:  Vitals:   10/13/21 1133  TempSrc:   PainSc: 0-No pain                 Shakea Isip

## 2021-10-13 NOTE — Anesthesia Preprocedure Evaluation (Addendum)
Anesthesia Evaluation  Patient identified by MRN, date of birth, ID band Patient awake    Reviewed: Allergy & Precautions, NPO status   Airway Mallampati: II  TM Distance: >3 FB     Dental   Pulmonary sleep apnea ,    breath sounds clear to auscultation       Cardiovascular negative cardio ROS   Rhythm:Regular Rate:Normal     Neuro/Psych PSYCHIATRIC DISORDERS    GI/Hepatic negative GI ROS, Neg liver ROS,   Endo/Other  negative endocrine ROS  Renal/GU negative Renal ROS     Musculoskeletal   Abdominal   Peds  Hematology   Anesthesia Other Findings   Reproductive/Obstetrics                             Anesthesia Physical Anesthesia Plan  ASA: 3  Anesthesia Plan: General   Post-op Pain Management:    Induction: Intravenous  PONV Risk Score and Plan: 2 and Ondansetron, Dexamethasone and Midazolam  Airway Management Planned: Oral ETT  Additional Equipment:   Intra-op Plan:   Post-operative Plan: Extubation in OR  Informed Consent: I have reviewed the patients History and Physical, chart, labs and discussed the procedure including the risks, benefits and alternatives for the proposed anesthesia with the patient or authorized representative who has indicated his/her understanding and acceptance.     Dental advisory given  Plan Discussed with: CRNA and Anesthesiologist  Anesthesia Plan Comments:         Anesthesia Quick Evaluation

## 2021-10-13 NOTE — Anesthesia Postprocedure Evaluation (Signed)
Anesthesia Post Note  Patient: Samuel Powers  Procedure(s) Performed: RADIOACTIVE SEED IMPLANT/BRACHYTHERAPY IMPLANT (Prostate) SPACE OAR INSTILLATION (Rectum) CYSTOSCOPY FLEXIBLE (Bladder)     Patient location during evaluation: PACU Anesthesia Type: General Level of consciousness: awake Pain management: pain level controlled Vital Signs Assessment: post-procedure vital signs reviewed and stable Respiratory status: spontaneous breathing Postop Assessment: no apparent nausea or vomiting Anesthetic complications: no   No notable events documented.  Last Vitals:  Vitals:   10/13/21 1145 10/13/21 1200  BP: 129/84 129/81  Pulse: 76 72  Resp: 18 10  Temp:    SpO2: 98% 98%    Last Pain:  Vitals:   10/13/21 1200  TempSrc:   PainSc: 0-No pain                 [name]

## 2021-10-13 NOTE — Interval H&P Note (Signed)
History and Physical Interval Note:  10/13/2021 8:56 AM  Samuel Powers  has presented today for surgery, with the diagnosis of PROSTATE CANCER.  The various methods of treatment have been discussed with the patient and family. After consideration of risks, benefits and other options for treatment, the patient has consented to  Procedure(s) with comments: RADIOACTIVE SEED IMPLANT/BRACHYTHERAPY IMPLANT (N/A) -    seeds implanted SPACE OAR INSTILLATION (N/A) CYSTOSCOPY FLEXIBLE - no seeds found in bladder as a surgical intervention.  The patient's history has been reviewed, patient examined, no change in status, stable for surgery.  I have reviewed the patient's chart and labs.  Questions were answered to the patient's satisfaction.     Irine Seal

## 2021-10-13 NOTE — Discharge Instructions (Signed)

## 2021-10-13 NOTE — Transfer of Care (Signed)
Immediate Anesthesia Transfer of Care Note  Patient: Samuel Powers  Procedure(s) Performed: RADIOACTIVE SEED IMPLANT/BRACHYTHERAPY IMPLANT (Prostate) SPACE OAR INSTILLATION (Rectum) CYSTOSCOPY FLEXIBLE (Bladder)  Patient Location: PACU  Anesthesia Type:General  Level of Consciousness: awake, alert  and oriented  Airway & Oxygen Therapy: Patient Spontanous Breathing and Patient connected to nasal cannula oxygen  Post-op Assessment: Report given to RN  Post vital signs: Reviewed and stable  Last Vitals:  Vitals Value Taken Time  BP 126/74   Temp    Pulse 76 10/13/21 1135  Resp 11 10/13/21 1135  SpO2 100 % 10/13/21 1135  Vitals shown include unvalidated device data.  Last Pain:  Vitals:   10/13/21 0752  TempSrc: Oral  PainSc: 0-No pain      Patients Stated Pain Goal: 7 (04/79/98 7215)  Complications: No notable events documented.

## 2021-10-14 ENCOUNTER — Encounter (HOSPITAL_BASED_OUTPATIENT_CLINIC_OR_DEPARTMENT_OTHER): Payer: Self-pay | Admitting: Urology

## 2021-10-14 NOTE — Progress Notes (Signed)
  Radiation Oncology         [phone] ________________________________  Name: AKEEL REFFNER MRN: [id number]  Date: 10/14/2021  DOB: [birth year]       Prostate Seed Implant  VE:[id number], Ashly M, DO  No ref. provider found  DIAGNOSIS:  Oncology History  Malignant neoplasm of prostate ([place])  06/01/2021 Cancer Staging   Staging form: Prostate, AJCC 8th Edition - Clinical stage from 06/01/2021: Stage IIB (cT1c, cN0, cM0, PSA: 6.9, Grade Group: 2) - Signed by Freeman Caldron, PA-C on 07/17/2021 Histopathologic type: Adenocarcinoma, NOS Stage prefix: Initial diagnosis Prostate specific antigen (PSA) range: Less than 10 Gleason primary pattern: 3 Gleason secondary pattern: 4 Gleason score: 7 Histologic grading system: 5 grade system Number of biopsy cores examined: 15 Number of biopsy cores positive: 8 Location of positive needle core biopsies: Both sides    07/17/2021 Initial Diagnosis   Malignant neoplasm of prostate ([place])     No diagnosis found.  PROCEDURE: Insertion of radioactive I-125 seeds into the prostate gland.  RADIATION DOSE: 145 Gy, definitive therapy.  TECHNIQUE: LEOTHA VOELTZ was brought to the operating room with the urologist. He was placed in the dorsolithotomy position. He was catheterized and a rectal tube was inserted. The perineum was shaved, prepped and draped. The ultrasound probe was then introduced into the rectum to see the prostate gland.  TREATMENT DEVICE: A needle grid was attached to the ultrasound probe stand and anchor needles were placed.  3D PLANNING: The prostate was imaged in 3D using a sagittal sweep of the prostate probe. These images were transferred to the planning computer. There, the prostate, urethra and rectum were defined on each axial reconstructed image. Then, the software created an optimized 3D plan and a few seed positions were adjusted. The quality of the plan was reviewed using [facility] information for the target and the following two  organs at risk:  Urethra and Rectum.  Then the accepted plan was printed and handed off to the radiation therapist.  Under my supervision, the custom loading of the seeds and spacers was carried out and loaded into sealed vicryl sleeves.  These pre-loaded needles were then placed into the needle holder.Marland Kitchen  PROSTATE VOLUME STUDY:  Using transrectal ultrasound the volume of the prostate was verified to be 29.2 cc.  SPECIAL TREATMENT PROCEDURE/SUPERVISION AND HANDLING: The pre-loaded needles were then delivered under sagittal guidance. A total of 19 needles were used to deposit 65 seeds in the prostate gland. The individual seed activity was 0.349 mCi.  SpaceOAR:  Yes  COMPLEX SIMULATION: At the end of the procedure, an anterior radiograph of the pelvis was obtained to document seed positioning and count. Cystoscopy was performed to check the urethra and bladder.  MICRODOSIMETRY: At the end of the procedure, the patient was emitting 0.125 mR/hr at 1 meter. Accordingly, he was considered safe for hospital discharge.  PLAN: The patient will return to the radiation oncology clinic for post implant CT dosimetry in three weeks.   ________________________________  Sheral Apley Tammi Klippel, M.D.

## 2021-10-25 ENCOUNTER — Encounter: Payer: Self-pay | Admitting: Family Medicine

## 2021-10-25 ENCOUNTER — Telehealth: Payer: 59 | Admitting: Physician Assistant

## 2021-10-25 DIAGNOSIS — U071 COVID-19: Secondary | ICD-10-CM

## 2021-10-25 MED ORDER — MOLNUPIRAVIR EUA 200MG CAPSULE: 4.0000 | ORAL_CAPSULE | Freq: Two times a day (BID) | ORAL | 0 refills | Status: AC

## 2021-10-25 NOTE — Patient Instructions (Signed)
1. COVID-19  - molnupiravir sent to pharmacy  - OTC medications for cough, congestion, headache and fever  - push fluids  - rest  - in person visit for shortness of breath

## 2021-10-25 NOTE — Progress Notes (Signed)
Samuel Powers, Samuel Powers are scheduled for a virtual visit with your provider today.    Just as we do with appointments in the office, we must obtain your consent to participate.  Your consent will be active for this visit and any virtual visit you may have with one of our providers in the next 365 days.    If you have a MyChart account, I can also send a copy of this consent to you electronically.  All virtual visits are billed to your insurance company just like a traditional visit in the office.  As this is a virtual visit, video technology does not allow for your provider to perform a traditional examination.  This may limit your provider's ability to fully assess your condition.  If your provider identifies any concerns that need to be evaluated in person or the need to arrange testing such as labs, EKG, etc, we will make arrangements to do so.    Although advances in technology are sophisticated, we cannot ensure that it will always work on either your end or our end.  If the connection with a video visit is poor, we may have to switch to a telephone visit.  With either a video or telephone visit, we are not always able to ensure that we have a secure connection.   I need to obtain your verbal consent now.   Are you willing to proceed with your visit today?   Samuel Powers has provided verbal consent on 10/25/2021 for a virtual visit (video or telephone).   Samuel Butts, PA-C 10/25/2021  12:58 PM   Date:  10/25/2021   ID:  Samuel Powers, DOB 1961/03/07, MRN [id number]  Patient Location: Home Provider Location: Home Office   Participants: Patient and Provider for Visit and Wrap up  Method of visit: Video  Location of Patient: Home Location of Provider: Home Office Consent was obtain for visit over the video. Services rendered by provider: Visit was performed via video  A video enabled telemedicine application was used and I verified that I am speaking with the correct person using two  identifiers.  PCP:  Janora Norlander, DO   Chief Complaint:  COVID +  History of Present Illness:    Samuel Powers is a 60 y.o. male with history as stated below. Presents video telehealth for an acute care visit  Onset of symptoms was yesterday and symptoms have been persistent and include: headache, low grade fever, fatigue.     Pt reports positive for COVID x 2 this morning.  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    Pt with headache and feeling badly last night. He awoke this morning without improvement and then tested positive this morning.    Pt is vaccinated and boosted.  Modifying factors include: tylenol with some relief.  No other aggravating or relieving factors.  No other c/o.  Records reviewed - no recent bloodowork.  Past Medical, Surgical, Social History, Allergies, and Medications have been Reviewed.  Patient Active Problem List   Diagnosis Date Noted   Malignant neoplasm of prostate [facility])    History of bladder stone 10/13/2020   Benign prostatic hyperplasia without lower urinary tract symptoms 10/13/2020   Stress at home 10/13/2020   Gross hematuria 06/13/2020   Primary insomnia 04/09/2020    Social History   Tobacco Use   Smoking status: Never   Smokeless tobacco: Never  Substance Use Topics   Alcohol use: Not Currently  Current Outpatient Medications:    molnupiravir EUA (LAGEVRIO) 200 mg CAPS capsule, Take 4 capsules (800 mg total) by mouth 2 (two) times daily for 5 days., Disp: 40 capsule, Rfl: 0   alfuzosin (UROXATRAL) 10 MG 24 hr tablet, Take 1 tablet (10 mg total) by mouth daily with breakfast., Disp: 90 tablet, Rfl: 3   Ascorbic Acid (VITAMIN C WITH ROSE HIPS) 500 MG tablet, Take 500 mg by mouth daily. (Patient not taking: Reported on 10/07/2021), Disp: , Rfl:    cetirizine (ZYRTEC) 10 MG tablet, Take 10 mg by mouth daily., Disp: , Rfl:    fluticasone (FLONASE) 50 MCG/ACT nasal spray, Place 1 spray into both  nostrils daily., Disp: , Rfl:    HYDROcodone-acetaminophen (NORCO/VICODIN) 5-325 MG tablet, Take 1 tablet by mouth every 6 (six) hours as needed for moderate pain., Disp: 8 tablet, Rfl: 0   montelukast (SINGULAIR) 10 MG tablet, TAKE ONE TABLET AT BEDTIME, Disp: 30 tablet, Rfl: 5   rosuvastatin (CRESTOR) 10 MG tablet, TAKE 1 TABLET AT BEDTIME (Patient taking differently: at bedtime.), Disp: 90 tablet, Rfl: 3   SF 5000 PLUS 1.1 % CREA dental cream, Take by mouth at bedtime. FLOURIDE CREAM, Disp: , Rfl:    valACYclovir (VALTREX) 1000 MG tablet, Take 1,000 mg by mouth as needed., Disp: , Rfl:    Allergies  Allergen Reactions   Codeine     Weird dreams and light headed     Review of Systems  Constitutional:  Positive for chills and fever.  HENT:  Positive for congestion. Negative for ear pain and sore throat.   Eyes:  Negative for blurred vision and double vision.  Respiratory:  Negative for cough, shortness of breath and wheezing.   Cardiovascular:  Negative for chest pain, palpitations and leg swelling.  Gastrointestinal:  Negative for abdominal pain, diarrhea, nausea and vomiting.  Genitourinary:  Negative for dysuria.  Musculoskeletal:  Negative for myalgias.  Skin:  Negative for rash.  Neurological:  Positive for headaches. Negative for loss of consciousness and weakness.  Psychiatric/Behavioral:  The patient is not nervous/anxious.   See HPI for history of present illness.  Physical Exam Constitutional:      General: He is not in acute distress.    Appearance: Normal appearance. He is well-developed. He is not ill-appearing.  HENT:     Head: Normocephalic and atraumatic.     Nose: Congestion present.  Eyes:     General: No scleral icterus.    Conjunctiva/sclera: Conjunctivae normal.  Pulmonary:     Effort: Pulmonary effort is normal.  Musculoskeletal:        General: Normal range of motion.     Cervical back: Normal range of motion.  Skin:    Coloration: Skin is not pale.   Neurological:     General: No focal deficit present.     Mental Status: He is alert.  Psychiatric:        Mood and Affect: Mood normal.              A&P  1. COVID-19  - molnupiravir sent to pharmacy  - OTC medications for cough, congestion, headache and fever  - push fluids  - rest  - in person visit for shortness of breath   Patient voiced understanding and agreement to plan.   Time:   Today, I have spent 15 minutes with the patient with telehealth technology discussing the above problems, reviewing the chart, previous notes, medications and orders.    Tests Ordered:  No orders of the defined types were placed in this encounter.   Medication Changes: Meds ordered this encounter  Medications   molnupiravir EUA (LAGEVRIO) 200 mg CAPS capsule    Sig: Take 4 capsules (800 mg total) by mouth 2 (two) times daily for 5 days.    Dispense:  40 capsule    Refill:  0     Disposition:  Follow up in person as needed for worsening symptoms or shortness of breath.  Celso Sickle, PA-C  10/25/2021 1:11 PM

## 2021-11-02 ENCOUNTER — Encounter: Payer: Self-pay | Admitting: Family Medicine

## 2021-11-03 ENCOUNTER — Encounter: Payer: Self-pay | Admitting: Family Medicine

## 2021-11-03 ENCOUNTER — Encounter: Payer: Self-pay | Admitting: Urology

## 2021-11-04 NOTE — Telephone Encounter (Signed)
Can you provide advice please?

## 2021-11-05 NOTE — Telephone Encounter (Signed)
Pt has DCd decongestants and symptoms have essentially resolved. In future, pt will use nasal flonase and cold remedies that do not contain decongestants.

## 2021-11-06 ENCOUNTER — Telehealth: Payer: Self-pay | Admitting: *Deleted

## 2021-11-06 NOTE — Telephone Encounter (Signed)
CALLED PATIENT TO REMIND OF POST SEED APPTS. FOR 11-10-21, SPOKE WITH PATIENT AND HE IS AWARE OF THESE APPS.

## 2021-11-09 NOTE — Progress Notes (Signed)
Radiation Oncology         [phone] ________________________________  Name: Samuel Powers MRN: [id number]  Date: 11/10/2021  DOB: 03/19/1961  Post-Seed Follow-Up Visit Note  CC: Janora Norlander, DO  Irine Seal, MD  Diagnosis:   60 y.o. gentleman with stage T1c adenocarcinoma of the prostate with a Gleason's score of 3+4 and a PSA of 6.9    ICD-10-CM   1. Malignant neoplasm of prostate ([place])  C61       Interval Since Last Radiation:  3.5 weeks 10/13/21:  Insertion of radioactive I-125 seeds into the prostate gland; 145 Gy, definitive therapy with placement of SpaceOAR gel.  Narrative:  The patient returns today for routine follow-up.  He is complaining of increased urinary frequency and urinary hesitation symptoms. He filled out a questionnaire regarding urinary function today providing and overall IPSS score of 20 characterizing his symptoms as severe with nocturia x2, weak stream, hesitancy and intermittency despite taking Uroxatral 10mg  daily as prescribed.  He does feel like the LUTS are gradually improving and specifically denies gross hematuria, dysuria, or incontinence. His pre-implant score was 8. He denies any abdominal pain or bowel symptoms aside from softer stools but denies diarrhea or constipation. He reports a healthy appetite and is maintaining his weight. He had some mild fatigue for the first 2 weeks but feels that this has resolved and back to his baseline at this point. Overall, he is quite pleased with his progress to date.  ALLERGIES:  is allergic to codeine.  Meds: Current Outpatient Medications  Medication Sig Dispense Refill   alfuzosin (UROXATRAL) 10 MG 24 hr tablet Take 1 tablet (10 mg total) by mouth daily with breakfast. 90 tablet 3   Ascorbic Acid (VITAMIN C WITH ROSE HIPS) 500 MG tablet Take 500 mg by mouth daily. (Patient not taking: Reported on 10/07/2021)     cetirizine (ZYRTEC) 10 MG tablet Take 10 mg by mouth daily.     fluticasone (FLONASE) 50  MCG/ACT nasal spray Place 1 spray into both nostrils daily.     HYDROcodone-acetaminophen (NORCO/VICODIN) 5-325 MG tablet Take 1 tablet by mouth every 6 (six) hours as needed for moderate pain. 8 tablet 0   montelukast (SINGULAIR) 10 MG tablet TAKE ONE TABLET AT BEDTIME 30 tablet 5   rosuvastatin (CRESTOR) 10 MG tablet TAKE 1 TABLET AT BEDTIME (Patient taking differently: at bedtime.) 90 tablet 3   SF 5000 PLUS 1.1 % CREA dental cream Take by mouth at bedtime. FLOURIDE CREAM     valACYclovir (VALTREX) 1000 MG tablet Take 1,000 mg by mouth as needed.     No current facility-administered medications for this visit.    Physical Findings: In general this is a well appearing Caucasian male in no acute distress. He's alert and oriented x4 and appropriate throughout the examination. Cardiopulmonary assessment is negative for acute distress and he exhibits normal effort.   Lab Findings: Lab Results  Component Value Date   WBC 3.9 12/12/2020   HGB 13.7 12/12/2020   HCT 38.6 12/12/2020   MCV 88.5 12/12/2020   PLT 288 12/12/2020    Radiographic Findings:  Patient underwent CT imaging in our clinic for post implant dosimetry. The CT will be reviewed by Dr. Tammi Klippel to confirm there is an adequate distribution of radioactive seeds throughout the prostate gland and ensure that there are no seeds in or near the rectum.  We suspect the final radiation plan and dosimetry will show appropriate coverage of the prostate gland.  He understands that we will call and inform him of any unexpected findings on further review of his imaging and dosimetry.  Impression/Plan: 60 y.o. gentleman with stage T1c adenocarcinoma of the prostate with a Gleason's score of 3+4 and a PSA of 6.9 The patient is recovering from the effects of radiation. His urinary symptoms should gradually improve over the next 4-6 months. We talked about this today. He is encouraged by his improvement already and is otherwise pleased with his  outcome. We also talked about long-term follow-up for prostate cancer following seed implant. He understands that ongoing PSA determinations and digital rectal exams will help perform surveillance to rule out disease recurrence. He has a follow up appointment scheduled with Jiles Crocker, NP on 11/11/21 and will see Dr. Jeffie Pollock in April 2023. He understands what to expect with his PSA measures. Patient was also educated today about some of the long-term effects from radiation including a small risk for rectal bleeding and possibly erectile dysfunction. We talked about some of the general management approaches to these potential complications. However, I did encourage the patient to contact our office or return at any point if he has questions or concerns related to his previous radiation and prostate cancer.    Nicholos Johns, PA-C

## 2021-11-10 ENCOUNTER — Ambulatory Visit: Payer: BC Managed Care – PPO | Admitting: Radiation Oncology

## 2021-11-10 ENCOUNTER — Ambulatory Visit
Admission: RE | Admit: 2021-11-10 | Discharge: 2021-11-10 | Disposition: A | Discharge status: Home / Self Care | Payer: BC Managed Care – PPO | Source: Ambulatory Visit | Attending: Urology | Admitting: Urology

## 2021-11-10 ENCOUNTER — Encounter: Payer: Self-pay | Admitting: Urology

## 2021-11-10 ENCOUNTER — Ambulatory Visit
Admission: RE | Admit: 2021-11-10 | Discharge: 2021-11-10 | Disposition: A | Discharge status: Home / Self Care | Payer: BC Managed Care – PPO | Source: Ambulatory Visit | Attending: Radiation Oncology | Admitting: Radiation Oncology

## 2021-11-10 ENCOUNTER — Other Ambulatory Visit: Payer: Self-pay

## 2021-11-10 VITALS — BP 144/91 | HR 83 | Temp 97.4°F | Resp 20 | Ht 70.5 in | Wt 166.0 lb

## 2021-11-10 DIAGNOSIS — C61 Malignant neoplasm of prostate: Secondary | ICD-10-CM | POA: Insufficient documentation

## 2021-11-10 DIAGNOSIS — Z923 Personal history of irradiation: Secondary | ICD-10-CM | POA: Insufficient documentation

## 2021-11-10 DIAGNOSIS — Z79899 Other long term (current) drug therapy: Secondary | ICD-10-CM | POA: Diagnosis not present

## 2021-11-10 DIAGNOSIS — R5383 Other fatigue: Secondary | ICD-10-CM | POA: Diagnosis not present

## 2021-11-10 NOTE — Progress Notes (Signed)
Patient reports urinary stream weakness/ difficulty. No other symptoms reported at this time.  Meaningful use complete.  I-PSS score of 20 (severe).  Currently on Alfuzosin 10mg  daily. Urology follow-up w/ [facility] scheduled for 11/10/21 at 1:00pm -per patient.  BP (!) 144/91 (BP Location: Right Arm, Patient Position: Sitting, Cuff Size: Normal)    Pulse 83    Temp (!) 97.4 F (36.3 C) (Temporal)    Resp 20    Ht 5' 10.5" (1.791 m)    Wt 166 lb (75.3 kg)    SpO2 100%    BMI 23.48 kg/m

## 2021-11-11 DIAGNOSIS — C61 Malignant neoplasm of prostate: Secondary | ICD-10-CM | POA: Diagnosis not present

## 2021-11-18 ENCOUNTER — Encounter: Payer: Self-pay | Admitting: Urology

## 2021-11-19 ENCOUNTER — Other Ambulatory Visit: Payer: Self-pay | Admitting: Family Medicine

## 2021-11-19 DIAGNOSIS — J309 Allergic rhinitis, unspecified: Secondary | ICD-10-CM

## 2021-11-20 NOTE — Progress Notes (Signed)
°  Radiation Oncology         [phone] ________________________________  Name: Samuel Powers MRN: [id number]  Date: 11/10/2021  DOB: Jul 23, 1961  COMPLEX SIMULATION NOTE  NARRATIVE:  The patient was brought to the [facility] today following prostate seed implantation approximately one month ago.  Identity was confirmed.  All relevant records and images related to the planned course of therapy were reviewed.  Then, the patient was set-up supine.  CT images were obtained.  The CT images were loaded into the planning software.  Then the prostate and rectum were contoured.  Treatment planning then occurred.  The implanted iodine 125 seeds were identified by the physics staff for projection of radiation distribution  I have requested : 3D Simulation  I have requested a DVH of the following structures: Prostate and rectum.    ________________________________  Sheral Apley Tammi Klippel, M.D.

## 2021-11-25 ENCOUNTER — Encounter: Payer: Self-pay | Admitting: Radiation Oncology

## 2021-11-25 DIAGNOSIS — C61 Malignant neoplasm of prostate: Secondary | ICD-10-CM | POA: Diagnosis not present

## 2021-11-26 NOTE — Progress Notes (Signed)
°  Radiation Oncology         [phone] ________________________________  Name: Samuel Powers MRN: [id number]  Date: 11/25/2021  DOB: Dec 06, 1960  3D Planning Note   Prostate Brachytherapy Post-Implant Dosimetry  Diagnosis: 61 y.o. gentleman with stage T1c adenocarcinoma of the prostate with a Gleason's score of 3+4 and a PSA of 6.9  Narrative: On a previous date, Samuel Powers returned following prostate seed implantation for post implant planning. He underwent CT scan complex simulation to delineate the three-dimensional structures of the pelvis and demonstrate the radiation distribution.  Since that time, the seed localization, and complex isodose planning with dose volume histograms have now been completed.  Results:   Prostate Coverage - The dose of radiation delivered to the 90% or more of the prostate gland (D90) was 121.57% of the prescription dose. This exceeds our goal of greater than 90%. Rectal Sparing - The volume of rectal tissue receiving the prescription dose or higher was 0.0 cc. This falls under our thresholds tolerance of 1.0 cc.  Impression: The prostate seed implant appears to show adequate target coverage and appropriate rectal sparing.  Plan:  The patient will continue to follow with urology for ongoing PSA determinations. I would anticipate a high likelihood for local tumor control with minimal risk for rectal morbidity.  ________________________________  Sheral Apley Tammi Klippel, M.D.

## 2022-01-04 DIAGNOSIS — H5213 Myopia, bilateral: Secondary | ICD-10-CM | POA: Diagnosis not present

## 2022-01-04 DIAGNOSIS — H35363 Drusen (degenerative) of macula, bilateral: Secondary | ICD-10-CM | POA: Diagnosis not present

## 2022-02-17 IMAGING — CT CT ABD-PEL WO/W CM
4 of 12 series · 12 of 46 positions shown, 18 images · IV contrast (omnipaque)
Comparison: None.

CLINICAL DATA: Gross hematuria

EXAM:
CT ABDOMEN AND PELVIS WITHOUT AND WITH CONTRAST
TECHNIQUE: Multidetector CT imaging of the abdomen and pelvis was performed
following the standard protocol before and following the bolus
administration of intravenous contrast.
CONTRAST:  125mL OMNIPAQUE IOHEXOL 300 MG/ML  SOLN

[Series 2: axial pre · axial · non-contrast · 0.74mm/px · z∈[+971,+1086]mm · 2 of 93 slices shown]
[im 24/93  soft-tissue]
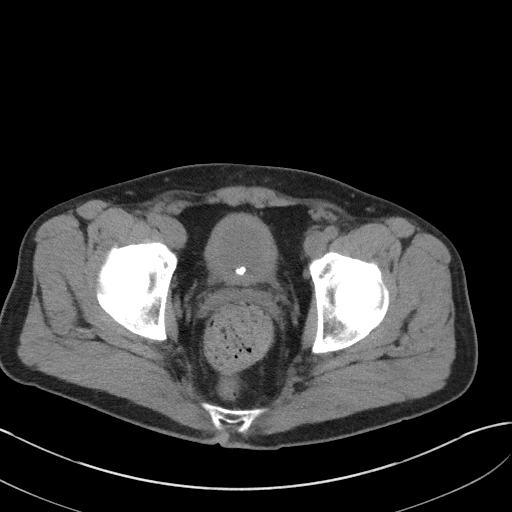
[im 47/93  soft-tissue]
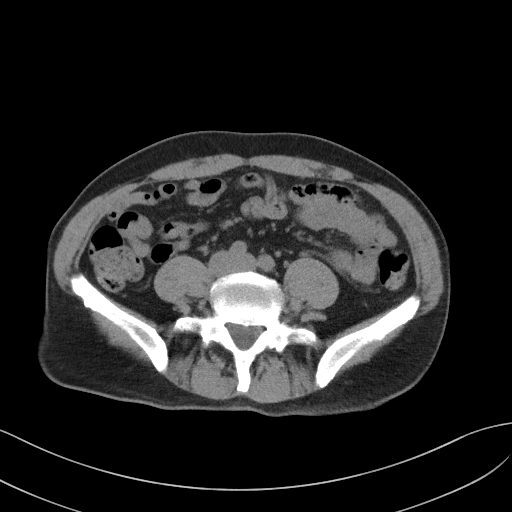

[Series 5: coronal pre · coronal · non-contrast · 0.78mm/px · 2 of 92 slices shown, 3 images]
[im 31/92  soft-tissue]
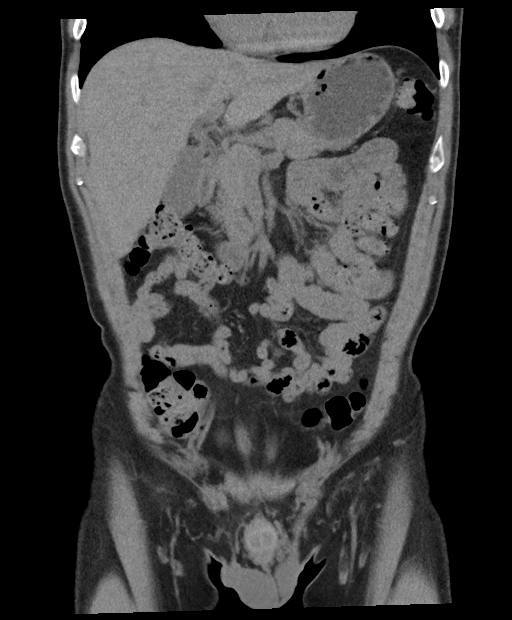
[im 31/92  bone]
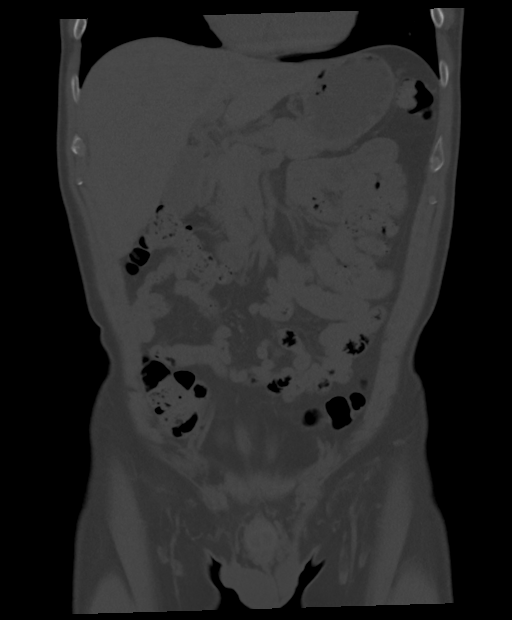
[im 61/92  soft-tissue]
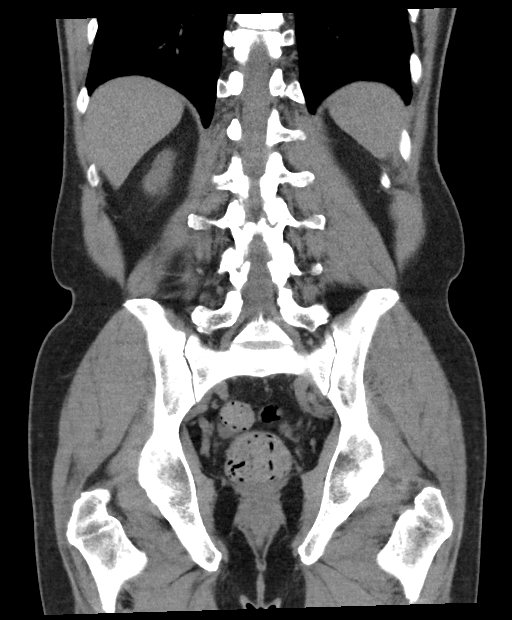

[Series 7: axial post · axial · 0.71mm/px · z∈[+946,+1221]mm · 4 of 93 slices shown, 9 images]
[im 19/93  soft-tissue]
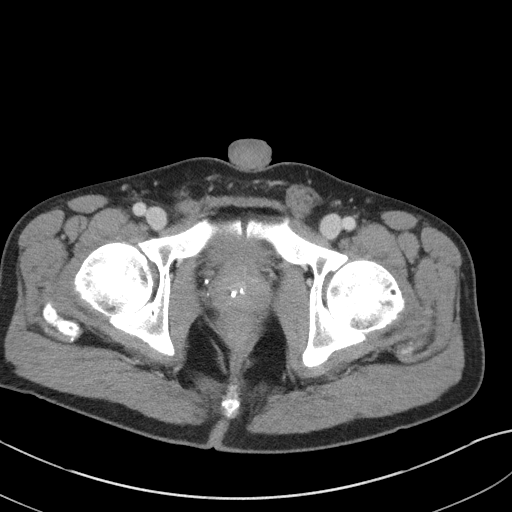
[im 19/93  lung]
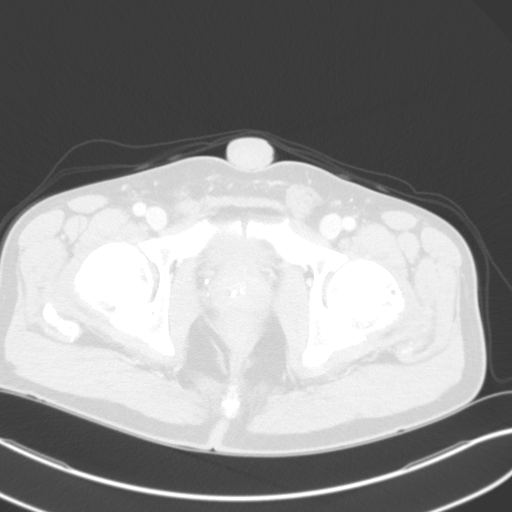
[im 19/93  bone]
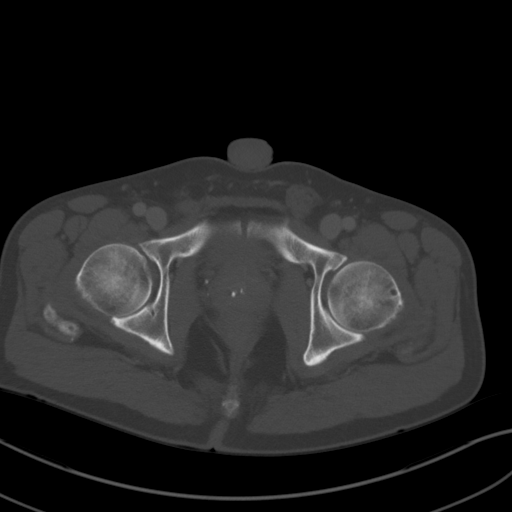
[im 37/93  soft-tissue]
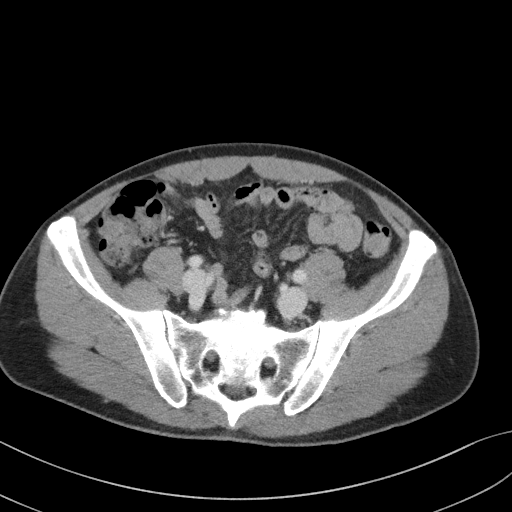
[im 37/93  lung]
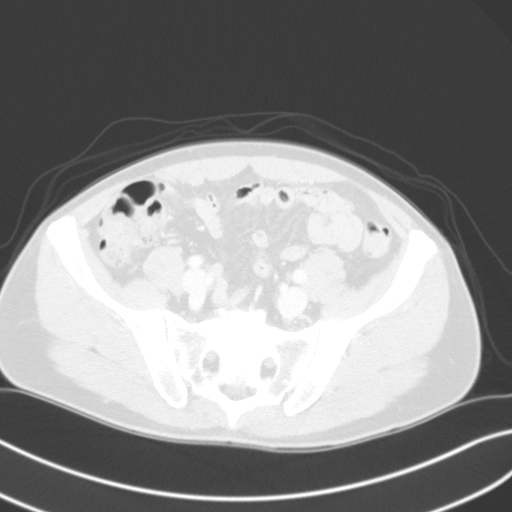
[im 56/93  soft-tissue]
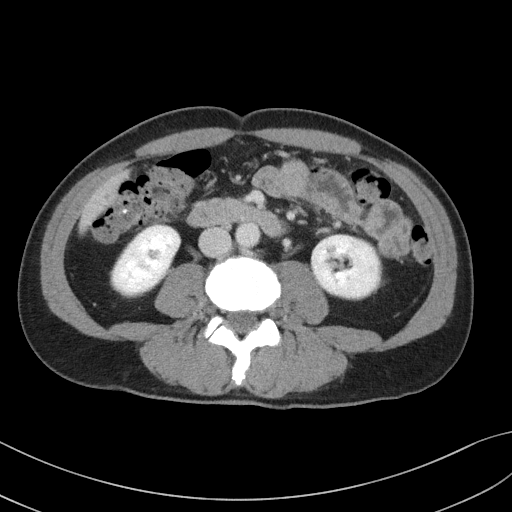
[im 56/93  lung]
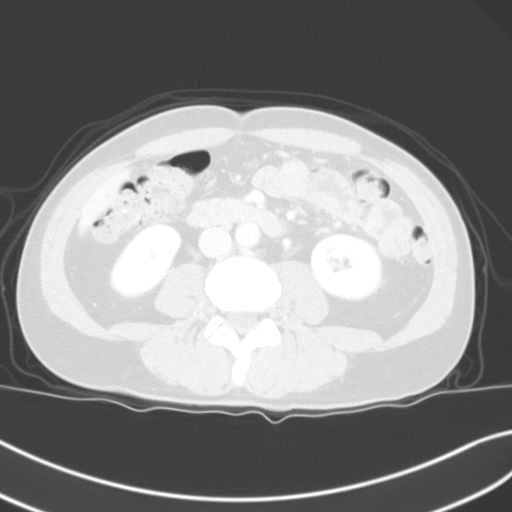
[im 74/93  soft-tissue]
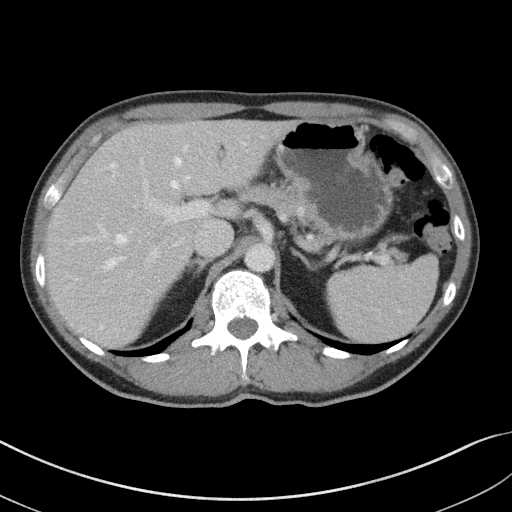
[im 74/93  lung]
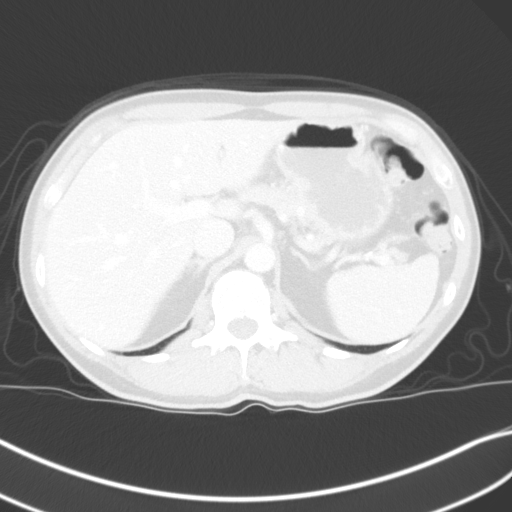

[Series 13: axial delay · axial · delayed · 0.78mm/px · z∈[+950,+1235]mm · 4 of 96 slices shown]
[im 20/96  soft-tissue]
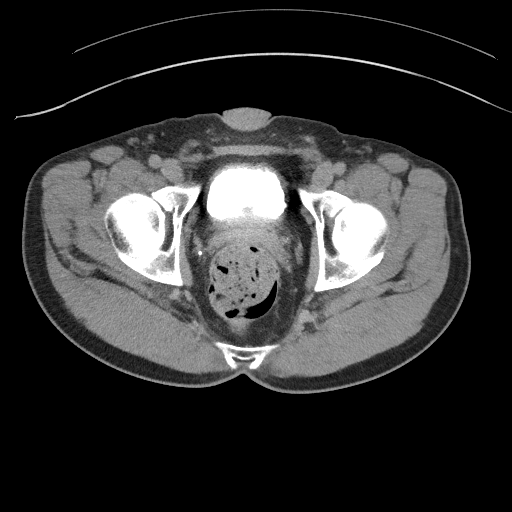
[im 39/96  soft-tissue]
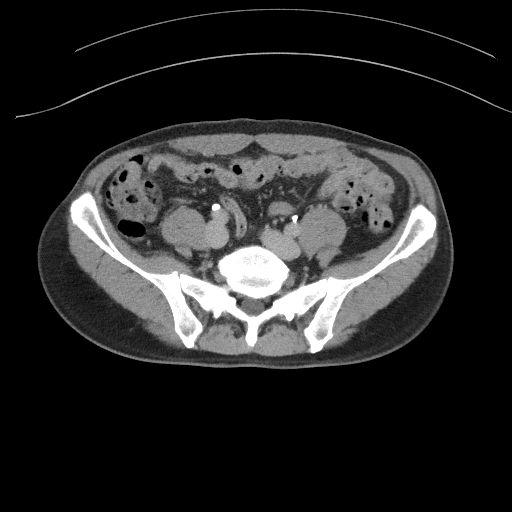
[im 58/96  soft-tissue]
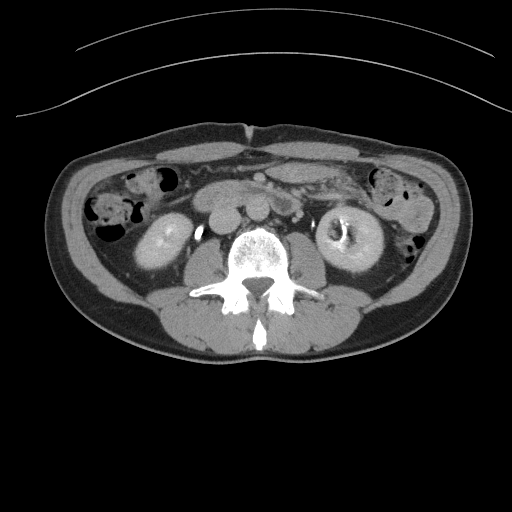
[im 77/96  soft-tissue]
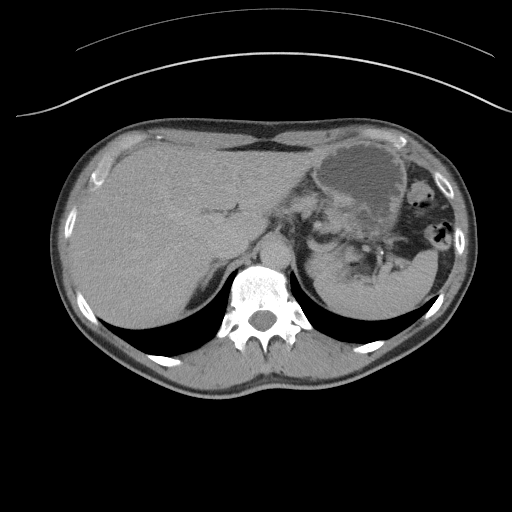

[12 of 46 positions shown; findings below may reference images not displayed]

FINDINGS: Lower chest: No acute abnormality.

Hepatobiliary: No solid liver abnormality is seen. No gallstones,
gallbladder wall thickening, or biliary dilatation.

Pancreas: Unremarkable. No pancreatic ductal dilatation or
surrounding inflammatory changes.

Spleen: Normal in size without significant abnormality.

Adrenals/Urinary Tract: Adrenal glands are unremarkable. Kidneys are
normal, without renal calculi, solid lesion, or hydronephrosis.
Thickening of the urinary bladder wall. There is a 7 mm calculus in
the dependent urinary bladder. No other filling defect on delayed
phase imaging.

Stomach/Bowel: Stomach is within normal limits. Appendix appears
normal. No evidence of bowel wall thickening, distention, or
inflammatory changes.

Vascular/Lymphatic: No significant vascular findings are present. No
enlarged abdominal or pelvic lymph nodes.

Reproductive: No mass or other significant abnormality.

Other: No abdominal wall hernia or abnormality. No abdominopelvic
ascites.

Musculoskeletal: No acute or significant osseous findings.
IMPRESSION: 1. Thickening of the urinary bladder wall, generally greater than
expected for chronic outlet obstruction and suggestive of
nonspecific infectious or inflammatory cystitis. Correlate with
urinalysis.
2. There is a 7 mm calculus in the dependent urinary bladder. No
other evidence of urinary tract calculus.

## 2022-02-25 ENCOUNTER — Other Ambulatory Visit: Payer: BC Managed Care – PPO

## 2022-02-25 ENCOUNTER — Ambulatory Visit (HOSPITAL_COMMUNITY)
Admission: RE | Admit: 2022-02-25 | Discharge: 2022-02-25 | Disposition: A | Discharge status: Home / Self Care | Payer: BC Managed Care – PPO | Source: Ambulatory Visit | Attending: Urology | Admitting: Urology

## 2022-02-25 DIAGNOSIS — Z87442 Personal history of urinary calculi: Secondary | ICD-10-CM | POA: Insufficient documentation

## 2022-02-25 DIAGNOSIS — R972 Elevated prostate specific antigen [PSA]: Secondary | ICD-10-CM | POA: Diagnosis not present

## 2022-02-25 DIAGNOSIS — N3289 Other specified disorders of bladder: Secondary | ICD-10-CM | POA: Diagnosis not present

## 2022-02-26 ENCOUNTER — Encounter: Payer: Self-pay | Admitting: Urology

## 2022-02-26 LAB — PSA, TOTAL AND FREE
PSA, Free Pct: 6.4 %
PSA, Free: 0.18 ng/mL
Prostate Specific Ag, Serum: 2.8 ng/mL (ref 0.0–4.0)

## 2022-03-04 ENCOUNTER — Encounter: Payer: Self-pay | Admitting: Urology

## 2022-03-04 ENCOUNTER — Ambulatory Visit (INDEPENDENT_AMBULATORY_CARE_PROVIDER_SITE_OTHER): Payer: BC Managed Care – PPO | Admitting: Urology

## 2022-03-04 VITALS — BP 125/73 | HR 79

## 2022-03-04 DIAGNOSIS — R310 Gross hematuria: Secondary | ICD-10-CM | POA: Diagnosis not present

## 2022-03-04 DIAGNOSIS — N200 Calculus of kidney: Secondary | ICD-10-CM

## 2022-03-04 DIAGNOSIS — R972 Elevated prostate specific antigen [PSA]: Secondary | ICD-10-CM | POA: Diagnosis not present

## 2022-03-04 DIAGNOSIS — C61 Malignant neoplasm of prostate: Secondary | ICD-10-CM

## 2022-03-04 DIAGNOSIS — R9389 Abnormal findings on diagnostic imaging of other specified body structures: Secondary | ICD-10-CM

## 2022-03-04 LAB — URINALYSIS, ROUTINE W REFLEX MICROSCOPIC
Bilirubin, UA: NEGATIVE
Glucose, UA: NEGATIVE
Ketones, UA: NEGATIVE
Leukocytes,UA: NEGATIVE
Nitrite, UA: NEGATIVE
Protein,UA: NEGATIVE
RBC, UA: NEGATIVE
Specific Gravity, UA: >1.03 — ABNORMAL HIGH (ref 1.005–1.030)
Urobilinogen, Ur: 0.2 mg/dL (ref 0.2–1.0)
pH, UA: 6 (ref 5.0–7.5)

## 2022-03-04 MED ORDER — ALFUZOSIN HCL ER 10 MG PO TB24: 10.0000 mg | ORAL_TABLET | Freq: Every day | ORAL | 3 refills | Status: DC

## 2022-03-04 NOTE — Progress Notes (Signed)
?Subjective: ?1. Abnormal results on imaging study of genitourinary system   ?2. Elevated PSA   ?3. Renal stone   ?4. Prostate cancer ([place])   ?  ? ? ? ?Samuel Powers is a 61 yo male who is sent by Dr. Lajuana Powers for gross hematuria after a walk with >30 RBC's on a UA on 04/24/20.  His Cr was 0.92 on 04/09/20.  He had it again over 05/11/20.  He has not seen it since.  He has intermittent urgency with some intermittency or straining to void.   He subsequently has had some right flank pain.  His UA Powers is clear.   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  He has no other GU history or complaints.  ? ?07/25/20:  Samuel Powers returns Powers in f/u.   He continues to have some right flank pain that is more of a discomfort and he has some frequency and urgency with straining to void.  His PSA is 3.8.  He had a CT that showed a 93m bladder stone.  With bladder wall thickening and trilobar hyperplasia.  His IPSS remains elevated at 29. ? ?09/05/20: HRenlyreturns Powers in f/u from recent cystoscopy with bladder stone removal.  He is doing well with improvement in the voiding symptoms.  He has an improved stream with reduced intermittency.  He has had no hematuria or dysuria.   His prostate biopsy was negative.  The prostate volume was 232m   He remains on alfuzosin.  His IPSS has declined to 6 from 2929? ?03/05/21: HeSuzanneeturns Powers in f/u for his history of bladder stones and an elevated PSA with negative prostate biopsy on 08/14/20.  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  HIs IPSS is 7 and his UA is clear.  He didn't get the PSA that was ordered for this visit. ? ?05/07/21: HeJosemigueleturns Powers in f/u for his history of an elevated PSA with a negative biopsy in 10/21.  His PSA has continued to rise and was  5.4 on 03/05/21 with a 9.1% f/t ratio and a repeat on 04/17/21 was  6.9.  He had an MRIP on 05/02/21 and it showed a 2321mrostate with a small PIRADS 4 lesion in the right anterior TZ.   ? ?06/11/21: HerJadielturns Powers to discuss his recent MR fusion biopsy.   He was found to have a 90m57mostate with Gleason 7(3+4) disease in 2/3 ROI biopsies and Gleason 6 in 1/3 ROI biopsies.   He also had Gleason 7(3+4) in the RMM and RAM biopsies and Gleason 6 in the RML and RAL as well as the LAM biopsies.   His most recent PSA is 6.9.  ? ?[facility] 60% OCD, 39% ECE, 4% LNI and 4% SVI. ? ?03/04/22: Samuel Powers in f/u for his history of prostate cancer treated with seeds on 10/13/22.  His PSA is down to 2.8 from 6.9 preop.   He remains on alfuzosin and has moderate LUTS but they are improving.  His IPSS is 18 with nocturia x 2 which has improved since he switched the alfuzosin to qhs.  He has had no hematuria.  He has had no GI issues.  He had a renal [place] that showed a possible 6mm 60m stone.  He has some chronic bladder wall thickening.  His UA  is clear. He has no associated signs or symptoms.    ? ? ? IPSS   ? ? Samuel Powers Name 03/04/22 0900  ?  ?  ?  ? International Prostate Symptom Score  ? How often have you had the sensation of not emptying your bladder? About half the time    ? How often have you had to urinate less than every two hours? About half the time    ? How often have you found you stopped and started again several times when you urinated? About half the time    ? How often have you found it difficult to postpone urination? Less than half the time    ? How often have you had a weak urinary stream? About half the time    ? How often have you had to strain to start urination? Less than half the time    ? How many times did you typically get up at night to urinate? 2 Times    ? Total IPSS Score 18    ?  ? Quality of Life due to urinary symptoms  ? If you were to spend the rest of your life with your urinary condition just the way it is now how would you feel about that? Mostly  Satisfied    ? ?  ?  ? ?  ? ? ? ?ROS: ? ?Review of Systems  ?All other systems reviewed and are negative. ? ?Allergies  ?Allergen Reactions  ? Codeine   ?  Weird dreams and light headed  ? ? ?Past Medical History:  ?Diagnosis Date  ? Allergy   ? Anxiety   ? BLADDER STONE 2021  ? Depression   ? Elevated PSA since june 2021  ? Hyperlipidemia   ? Prostate cancer (Samuel Powers)   ? Sleep apnea   ? DID NOT TOLERATE CPAP OR MOUTH GUARD MODERATE OSA SLEEP STUDY 2017  ? Wears glasses 10/07/2021  ? OR CONTACTS  ? ? ?Past Surgical History:  ?Procedure Laterality Date  ? COLONOSCOPY  09/27/2021  ? CYSTOSCOPY N/A 10/13/2021  ? Procedure: CYSTOSCOPY FLEXIBLE;  Surgeon: Samuel Seal, MD;  Location: [facility];  Service: Urology;  Laterality: N/A;  no seeds found in bladder  ? CYSTOSCOPY WITH LITHOLAPAXY N/A 08/14/2020  ? Procedure: CYSTOSCOPY WITH REMOVAL OF BLADDER STONE;  Surgeon: Samuel Seal, MD;  Location: [facility];  Service: Urology;  Laterality: N/A;  ? POLYPECTOMY    ? PROSTATE BIOPSY N/A 08/14/2020  ? Procedure: BIOPSY TRANSRECTAL ULTRASONIC PROSTATE (TUBP);  Surgeon: Samuel Seal, MD;  Location: [facility];  Service: Urology;  Laterality: N/A;  ? RADIOACTIVE SEED IMPLANT N/A 10/13/2021  ? Procedure: RADIOACTIVE SEED IMPLANT/BRACHYTHERAPY IMPLANT;  Surgeon: Samuel Seal, MD;  Location: [facility];  Service: Urology;  Laterality: N/A;  67   seeds implanted  ? SPACE OAR INSTILLATION N/A 10/13/2021  ? Procedure: SPACE OAR INSTILLATION;  Surgeon: Samuel Seal, MD;  Location: [facility];  Service: Urology;  Laterality: N/A;  ? TONSILLECTOMY Bilateral 2000  ? ? ?Social History  ? ?Socioeconomic History  ? Marital status: Single  ?  Spouse name: Not on file  ? Number of children: Not on file  ? Years of education: Not on file  ? Highest education level: Not on file  ?Occupational History  ? Occupation: Engineer, maintenance (IT)  ?Tobacco Use  ? Smoking status: Never  ? Smokeless  tobacco: Never  ?Vaping Use  ? Vaping Use:  Never used  ?Substance and Sexual Activity  ? Alcohol use: Not Currently  ? Drug use: Never  ? Sexual activity: Not on file  ?Other Topics Concern  ? Not on file  ?Social History Narrative  ? Recently relocated back home from [place] to be closer to his mother, who resides in [place].  ? ?Social Determinants of Health  ? ?Financial Resource Strain: Not on file  ?Food Insecurity: Not on file  ?Transportation Needs: Not on file  ?Physical Activity: Not on file  ?Stress: Not on file  ?Social Connections: Not on file  ?Intimate Partner Violence: Not on file  ? ? ?Family History  ?Problem Relation Age of Onset  ? COPD Mother   ? Diabetes Mother   ? Hypertension Mother   ? Alcohol abuse Father   ? COPD Brother   ? Colon cancer Neg Hx   ? Rectal cancer Neg Hx   ? Stomach cancer Neg Hx   ? ? ?Anti-infectives: ?Anti-infectives (From admission, onward)  ? ? None  ? ?  ? ? ?Current Outpatient Medications  ?Medication Sig Dispense Refill  ? cetirizine (ZYRTEC) 10 MG tablet Take 10 mg by mouth daily.    ? fluticasone (FLONASE) 50 MCG/ACT nasal spray Place 1 spray into both nostrils daily.    ? montelukast (SINGULAIR) 10 MG tablet TAKE ONE TABLET AT BEDTIME 30 tablet 5  ? rosuvastatin (CRESTOR) 10 MG tablet TAKE 1 TABLET AT BEDTIME (Patient taking differently: at bedtime.) 90 tablet 3  ? SF 5000 PLUS 1.1 % CREA dental cream Take by mouth at bedtime. FLOURIDE CREAM    ? valACYclovir (VALTREX) 1000 MG tablet Take 1,000 mg by mouth as needed.    ? alfuzosin (UROXATRAL) 10 MG 24 hr tablet Take 1 tablet (10 mg total) by mouth daily with breakfast. 90 tablet 3  ? ?No current facility-administered medications for this visit.  ? ? ? ?Objective: ?Vital signs in last 24 hours: ?BP 125/73   Pulse 79  ? ?Intake/Output from previous day: ?No intake/output data recorded. ?Intake/Output this shift: ?'@IOTHISSHIFT'$ @ ? ? ?Physical Exam ? ?Lab Results:  ? ?Results for orders placed or performed in  visit on 03/04/22 (from the past 24 hour(s))  ?Urinalysis, Routine w reflex microscopic     Status: Abnormal  ? Collection Time: 03/04/22  9:10 AM  ?Result Value Ref Range  ? Specific Gravity, UA >1.030 (H)

## 2022-04-14 DIAGNOSIS — L578 Other skin changes due to chronic exposure to nonionizing radiation: Secondary | ICD-10-CM | POA: Diagnosis not present

## 2022-04-14 DIAGNOSIS — L814 Other melanin hyperpigmentation: Secondary | ICD-10-CM | POA: Diagnosis not present

## 2022-04-14 DIAGNOSIS — L219 Seborrheic dermatitis, unspecified: Secondary | ICD-10-CM | POA: Diagnosis not present

## 2022-04-14 DIAGNOSIS — L821 Other seborrheic keratosis: Secondary | ICD-10-CM | POA: Diagnosis not present

## 2022-04-27 ENCOUNTER — Other Ambulatory Visit: Payer: Self-pay | Admitting: Family Medicine

## 2022-04-27 DIAGNOSIS — E785 Hyperlipidemia, unspecified: Secondary | ICD-10-CM

## 2022-05-21 ENCOUNTER — Other Ambulatory Visit: Payer: Self-pay | Admitting: Family Medicine

## 2022-05-21 DIAGNOSIS — E785 Hyperlipidemia, unspecified: Secondary | ICD-10-CM

## 2022-05-21 DIAGNOSIS — J309 Allergic rhinitis, unspecified: Secondary | ICD-10-CM

## 2022-05-24 ENCOUNTER — Telehealth: Payer: Self-pay | Admitting: Family Medicine

## 2022-05-24 NOTE — Telephone Encounter (Signed)
Lmtcb-due to the fact pt hasn't been seen in over a year we are not able to send any refills in till he is seen in office.

## 2022-05-24 NOTE — Telephone Encounter (Signed)
  Prescription Request  05/24/2022  Is this a "Controlled Substance" medicine? np  Have you seen your PCP in the last 2 weeks? July 31  If YES, route message to pool  -  If NO, patient needs to be scheduled for appointment.  What is the name of the medication or equipment? Crestor and sinulair  Have you contacted your pharmacy to request a refill? yes   Which pharmacy would you like this sent to? [facility]   Patient notified that their request is being sent to the clinical staff for review and that they should receive a response within 2 business days.

## 2022-05-25 ENCOUNTER — Encounter: Payer: Self-pay | Admitting: Family Medicine

## 2022-05-25 ENCOUNTER — Other Ambulatory Visit: Payer: Self-pay | Admitting: Family Medicine

## 2022-05-25 ENCOUNTER — Telehealth: Payer: Self-pay | Admitting: Family Medicine

## 2022-05-25 DIAGNOSIS — J309 Allergic rhinitis, unspecified: Secondary | ICD-10-CM

## 2022-05-25 DIAGNOSIS — E785 Hyperlipidemia, unspecified: Secondary | ICD-10-CM

## 2022-05-25 MED ORDER — ROSUVASTATIN CALCIUM 10 MG PO TABS: 10.0000 mg | ORAL_TABLET | Freq: Every day | ORAL | 1 refills | Status: DC

## 2022-05-25 MED ORDER — MONTELUKAST SODIUM 10 MG PO TABS: 10.0000 mg | ORAL_TABLET | Freq: Every day | ORAL | 1 refills | Status: DC

## 2022-05-27 NOTE — Telephone Encounter (Signed)
Spoke with patient, refills were sent in on 05/25/22 and he picked up yesterday.

## 2022-05-27 NOTE — Telephone Encounter (Signed)
Please call patient, needs to schedule appointment before refills.

## 2022-05-27 NOTE — Telephone Encounter (Signed)
Pt has appt for CPE with Dr. Darnell Level., 07/16/2022, this is PCP first available appt.

## 2022-06-07 ENCOUNTER — Ambulatory Visit: Payer: 59 | Admitting: Family Medicine

## 2022-06-10 ENCOUNTER — Encounter: Payer: Self-pay | Admitting: Family Medicine

## 2022-06-11 ENCOUNTER — Encounter: Payer: Self-pay | Admitting: Nurse Practitioner

## 2022-06-11 ENCOUNTER — Ambulatory Visit (INDEPENDENT_AMBULATORY_CARE_PROVIDER_SITE_OTHER): Payer: BC Managed Care – PPO | Admitting: Nurse Practitioner

## 2022-06-11 VITALS — BP 133/78 | HR 78 | Temp 98.0°F | Resp 20 | Ht 70.0 in | Wt 169.0 lb

## 2022-06-11 DIAGNOSIS — M545 Low back pain, unspecified: Secondary | ICD-10-CM | POA: Diagnosis not present

## 2022-06-11 MED ORDER — KETOROLAC TROMETHAMINE 60 MG/2ML IM SOLN
60.0000 mg | Freq: Once | INTRAMUSCULAR | Status: AC
  Administered 2022-06-11: 60 mg via INTRAMUSCULAR

## 2022-06-11 MED ORDER — METHYLPREDNISOLONE ACETATE 80 MG/ML IJ SUSP
80.0000 mg | Freq: Once | INTRAMUSCULAR | Status: AC
  Administered 2022-06-11: 80 mg via INTRAMUSCULAR

## 2022-06-11 MED ORDER — PREDNISONE 20 MG PO TABS: ORAL_TABLET | ORAL | 0 refills | Status: DC

## 2022-06-11 NOTE — Progress Notes (Signed)
   Subjective:    Patient ID: Samuel Powers, male    DOB: August 25, 1961, 61 y.o.   MRN: [id number]   Chief Complaint: Back Pain   Back Pain This is a new problem. The current episode started yesterday. The problem occurs intermittently. The problem has been waxing and waning since onset. The pain is present in the lumbar spine. The quality of the pain is described as aching and stabbing. The pain does not radiate. The pain is at a severity of 4/10. The pain is moderate. The pain is Worse during the day. The symptoms are aggravated by lying down. Pertinent negatives include no leg pain, numbness or paresis. He has tried heat and NSAIDs for the symptoms. The treatment provided moderate relief.  He has recently started working out with a Clinical research associate.    Review of Systems  Musculoskeletal:  Positive for back pain.  Neurological:  Negative for numbness.       Objective:   Physical Exam Vitals reviewed.  Constitutional:      Appearance: Normal appearance. He is normal weight.  Cardiovascular:     Rate and Rhythm: Normal rate and regular rhythm.     Heart sounds: Normal heart sounds.  Pulmonary:     Effort: Pulmonary effort is normal.     Breath sounds: Normal breath sounds.  Musculoskeletal:     Comments: Rises slowy from sitting to standing FROM of lumbar spine with pain on flexion ext and rotation Motor strength and sensation distally intact (-) SLR bil  Skin:    General: Skin is warm.  Neurological:     General: No focal deficit present.     Mental Status: He is alert and oriented to person, place, and time.  Psychiatric:        Mood and Affect: Mood normal.        Behavior: Behavior normal.   BP 133/78   Pulse 78   Temp 98 F (36.7 C) (Temporal)   Resp 20   Ht '5\' 10"'$  (1.778 m)   Wt 169 lb (76.7 kg)   SpO2 100%   BMI 24.25 kg/m        Assessment & Plan:   Samuel Powers in today with chief complaint of Back Pain   1. Acute midline low back pain without  sciatica Moist heat Rest If hurts dont do it - methylPREDNISolone acetate (DEPO-MEDROL) injection 80 mg - ketorolac (TORADOL) injection 60 mg - predniSONE (DELTASONE) 20 MG tablet; 2 po at sametime daily for 5 days-  Dispense: 10 tablet; Refill: 0    The above assessment and management plan was discussed with the patient. The patient verbalized understanding of and has agreed to the management plan. Patient is aware to call the clinic if symptoms persist or worsen. Patient is aware when to return to the clinic for a follow-up visit. Patient educated on when it is appropriate to go to the emergency department.   Mary-Margaret Hassell Done, FNP

## 2022-07-16 ENCOUNTER — Ambulatory Visit (INDEPENDENT_AMBULATORY_CARE_PROVIDER_SITE_OTHER): Payer: BC Managed Care – PPO | Admitting: Family Medicine

## 2022-07-16 ENCOUNTER — Encounter: Payer: Self-pay | Admitting: Family Medicine

## 2022-07-16 VITALS — BP 139/85 | HR 63 | Temp 98.6°F | Ht 70.0 in | Wt 168.8 lb

## 2022-07-16 DIAGNOSIS — J3089 Other allergic rhinitis: Secondary | ICD-10-CM | POA: Diagnosis not present

## 2022-07-16 DIAGNOSIS — C61 Malignant neoplasm of prostate: Secondary | ICD-10-CM | POA: Diagnosis not present

## 2022-07-16 DIAGNOSIS — E7800 Pure hypercholesterolemia, unspecified: Secondary | ICD-10-CM | POA: Diagnosis not present

## 2022-07-16 DIAGNOSIS — Z0000 Encounter for general adult medical examination without abnormal findings: Secondary | ICD-10-CM

## 2022-07-16 DIAGNOSIS — G4733 Obstructive sleep apnea (adult) (pediatric): Secondary | ICD-10-CM

## 2022-07-16 DIAGNOSIS — Z0001 Encounter for general adult medical examination with abnormal findings: Secondary | ICD-10-CM | POA: Diagnosis not present

## 2022-07-16 MED ORDER — MONTELUKAST SODIUM 10 MG PO TABS: 10.0000 mg | ORAL_TABLET | Freq: Every day | ORAL | 3 refills | Status: DC

## 2022-07-16 MED ORDER — ROSUVASTATIN CALCIUM 10 MG PO TABS: 10.0000 mg | ORAL_TABLET | Freq: Every day | ORAL | 3 refills | Status: DC

## 2022-07-16 NOTE — Progress Notes (Signed)
Samuel Powers is a 61 y.o. male presents to office today for annual physical exam examination.    Concerns today include: 1.  Insomnia Patient reports difficulties both falling asleep and staying asleep.  Things have gotten slightly better since he switched his Uroxatrol to evening and he is not waking up 7 times to urinate.  He admits to some increased stress and anxiety.  He has moved in with his mother, who sustained multiple falls.  He is now living in [place] but continues to work locally.  Taking 5 mg of melatonin at bedtime.  He did go as high as 10 mg but it made him feel drunk so he has not gone back up to that dose.  Has a history of obstructive sleep apnea and was diagnosed over 6 years ago but could not tolerate the CPAP due to various issues.  He is certainly willing to retry this as he wants to avoid any prescription sleep medicines if possible.  2.  BPH with history of prostate cancer Patient is status post treatment with CPAP therapy.  Aside from nocturia he seems to be doing relatively well.  He is having no leakage requiring any type of pads.  He has not had any hematuria or other concerning genital symptoms.  Continues to follow-up with Dr. 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 Immunization History  Administered Date(s) Administered   Influenza,inj,Quad PF,6+ Mos 08/17/2019, 09/09/2020   Influenza-Unspecified 08/22/2021   Moderna Sars-Covid-2 Vaccination 01/17/2020, 02/16/2020, 09/05/2020, 02/15/2021, 07/18/2021   Pneumococcal Polysaccharide-23 04/17/2021   Tdap 04/09/2020     Past Medical History:  Diagnosis Date   Allergy    Anxiety     BLADDER STONE 2021   Depression    Elevated PSA since june 2021   Hyperlipidemia    Prostate cancer ([place])    Sleep apnea    DID NOT TOLERATE CPAP OR MOUTH GUARD MODERATE OSA SLEEP STUDY 2017   Wears glasses 10/07/2021   OR CONTACTS   Social History   Socioeconomic History   Marital status: Single    Spouse name: Not on file   Number of children: Not on file   Years of education: Not on file   Highest education level: Not on file  Occupational History   Occupation: Engineer, maintenance (IT)  Tobacco Use   Smoking status: Never   Smokeless tobacco: Never  Vaping Use   Vaping Use: Never used  Substance and Sexual Activity   Alcohol use: Not Currently   Drug use: Never   Sexual activity: Not on file  Other Topics Concern   Not on file  Social History Narrative   Recently relocated back home from [place] to be closer to his mother, who resides in [place].   Social Determinants of Health   Financial Resource Strain: Not on file  Food Insecurity: Not on file  Transportation Needs: Not on file  Physical Activity: Not on file  Stress: Not on file  Social Connections: Not on file  Intimate Partner Violence: Not on file   Past Surgical History:  Procedure Laterality Date   COLONOSCOPY  09/27/2021   CYSTOSCOPY N/A 10/13/2021   Procedure: CYSTOSCOPY FLEXIBLE;  Surgeon: Irine Seal, MD;  Location:  [facility];  Service: Urology;  Laterality: N/A;  no seeds found in bladder   CYSTOSCOPY WITH LITHOLAPAXY N/A 08/14/2020   Procedure: CYSTOSCOPY WITH REMOVAL OF BLADDER STONE;  Surgeon: Irine Seal, MD;  Location: [facility];  Service: Urology;  Laterality: N/A;   POLYPECTOMY     PROSTATE BIOPSY N/A 08/14/2020   Procedure: BIOPSY TRANSRECTAL ULTRASONIC PROSTATE (TUBP);  Surgeon: Irine Seal, MD;  Location[facility];  Service: Urology;  Laterality: N/A;   RADIOACTIVE SEED IMPLANT N/A 10/13/2021   Procedure: RADIOACTIVE SEED IMPLANT/BRACHYTHERAPY  IMPLANT;  Surgeon: Irine Seal, MD;  Location: [facility];  Service: Urology;  Laterality: N/A;  67   seeds implanted   SPACE OAR INSTILLATION N/A 10/13/2021   Procedure: SPACE OAR INSTILLATION;  Surgeon: Irine Seal, MD;  Location: [facility];  Service: Urology;  Laterality: N/A;   TONSILLECTOMY Bilateral 2000   Family History  Problem Relation Age of Onset   COPD Mother    Diabetes Mother    Hypertension Mother    Alcohol abuse Father    COPD Brother    Colon cancer Neg Hx    Rectal cancer Neg Hx    Stomach cancer Neg Hx     Current Outpatient Medications:    alfuzosin (UROXATRAL) 10 MG 24 hr tablet, Take 1 tablet (10 mg total) by mouth daily with breakfast., Disp: 90 tablet, Rfl: 3   cetirizine (ZYRTEC) 10 MG tablet, Take 10 mg by mouth daily., Disp: , Rfl:    fluticasone (FLONASE) 50 MCG/ACT nasal spray, Place 1 spray into both nostrils daily., Disp: , Rfl:    montelukast (SINGULAIR) 10 MG tablet, Take 1 tablet (10 mg total) by mouth at bedtime., Disp: 90 tablet, Rfl: 1   predniSONE (DELTASONE) 20 MG tablet, 2 po at sametime daily for 5 days-, Disp: 10 tablet, Rfl: 0   rosuvastatin (CRESTOR) 10 MG tablet, Take 1 tablet (10 mg total) by mouth at bedtime., Disp: 90 tablet, Rfl: 1   SF 5000 PLUS 1.1 % CREA dental cream, Take by mouth at bedtime. FLOURIDE CREAM, Disp: , Rfl:    valACYclovir (VALTREX) 1000 MG tablet, Take 1,000 mg by mouth as needed., Disp: , Rfl:   Allergies  Allergen Reactions   Codeine     Weird dreams and light headed     ROS: Review of Systems A comprehensive review of systems was negative except for: Genitourinary: positive for frequency and nocturia Behavioral/Psych: positive for sleep disturbance and caregiver stress    Physical exam BP 139/85   Pulse 63   Temp 98.6 F (37 C) (Temporal)   Ht 5' 10"  (1.778 m)   Wt 168 lb 12.8 oz (76.6 kg)   SpO2 100%   BMI 24.22 kg/m  General appearance: alert, cooperative, appears  stated age, and no distress Head: Normocephalic, without obvious abnormality, atraumatic Eyes: negative findings: lids and lashes normal and conjunctivae and sclerae normal Ears: normal TM's and external ear canals both ears Nose: Nares normal. Septum midline. Mucosa normal. No drainage or sinus tenderness. Throat: lips, mucosa, and tongue normal; teeth and gums normal Neck: no adenopathy, no carotid bruit, supple, symmetrical, trachea midline, and thyroid not enlarged, symmetric, no tenderness/mass/nodules Back: symmetric, no curvature. ROM normal. No CVA tenderness. Lungs: clear to auscultation bilaterally Chest wall: no tenderness Heart: regular rate and rhythm, S1, S2 normal, no murmur, click, rub or gallop Abdomen: soft, non-tender; bowel sounds normal; no masses,  no organomegaly Extremities:  extremities normal, atraumatic, no cyanosis or edema Pulses: 2+ and symmetric Skin: Skin color, texture, turgor normal. No rashes or lesions (sees Derm yearly) Lymph nodes: Cervical, supraclavicular, and axillary nodes normal. Neurologic: Grossly normal Psych: Mood stable, speech normal, affect appropriate.  Very pleasant and interactive   Assessment/ Plan: ZAIM NITTA here for annual physical exam.   Annual physical exam  Pure hypercholesterolemia - Plan: rosuvastatin (CRESTOR) 10 MG tablet, CMP14+EGFR, Lipid Panel, TSH  Malignant neoplasm of prostate ([place]) - Plan: PSA, CBC  Non-seasonal allergic rhinitis, unspecified trigger - Plan: montelukast (SINGULAIR) 10 MG tablet  OSA (obstructive sleep apnea) - Plan: Ambulatory referral to Sleep Studies  We will have shingles, COVID and influenza vaccinations administered at a later date  Nonfasting lipid obtained today.  Continue Crestor.  This has been renewed  PSA collected and CCed to Dr. Jeffie Pollock.  Check CBC as well given history of malignant neoplasm.  Aside from a little bit of nocturia he seems to be doing relatively well after seed  treatment  Singulair renewed  Referral to sleep studies has been placed.  Anticipate home study would be sufficient for this patient.  He will follow-up with me if symptoms or not improving with appropriate treatment for OSA  Counseled on healthy lifestyle choices, including diet (rich in fruits, vegetables and lean meats and low in salt and simple carbohydrates) and exercise (at least 30 minutes of moderate physical activity daily).  Patient to follow up in 1 year for annual exam or sooner if needed.  Jahara Dail M. Lajuana Ripple, DO

## 2022-07-16 NOTE — Patient Instructions (Signed)
[email or web address] [place], [place], [place] [address]  [phone]  Preventive Care 55-59 Years Old, Male Preventive care refers to lifestyle choices and visits with your health care provider that can promote health and wellness. Preventive care visits are also called wellness exams. What can I expect for my preventive care visit? Counseling During your preventive care visit, your health care provider may ask about your: Medical history, including: Past medical problems. Family medical history. Current health, including: Emotional well-being. Home life and relationship well-being. Sexual activity. Lifestyle, including: Alcohol, nicotine or tobacco, and drug use. Access to firearms. Diet, exercise, and sleep habits. Safety issues such as seatbelt and bike helmet use. Sunscreen use. Work and work Statistician. Physical exam Your health care provider will check your: Height and weight. These may be used to calculate your BMI (body mass index). BMI is a measurement that tells if you are at a healthy weight. Waist circumference. This measures the distance around your waistline. This measurement also tells if you are at a healthy weight and may help predict your risk of certain diseases, such as type 2 diabetes and high blood pressure. Heart rate and blood pressure. Body temperature. Skin for abnormal spots. What immunizations do I need?  Vaccines are usually given at various ages, according to a schedule. Your health care provider will recommend vaccines for you based on your age, medical history, and lifestyle or other factors, such as travel or where you work. What tests do I need? Screening Your health care provider may recommend screening tests for certain conditions. This may include: Lipid and cholesterol levels. Diabetes screening. This is done by checking your blood sugar (glucose) after you have not eaten for a while  (fasting). Hepatitis B test. Hepatitis C test. HIV (human immunodeficiency virus) test. STI (sexually transmitted infection) testing, if you are at risk. Lung cancer screening. Prostate cancer screening. Colorectal cancer screening. Talk with your health care provider about your test results, treatment options, and if necessary, the need for more tests. Follow these instructions at home: Eating and drinking  Eat a diet that includes fresh fruits and vegetables, whole grains, lean protein, and low-fat dairy products. Take vitamin and mineral supplements as recommended by your health care provider. Do not drink alcohol if your health care provider tells you not to drink. If you drink alcohol: Limit how much you have to 0-2 drinks a day. Know how much alcohol is in your drink. In the [place] one drink equals one 12 oz bottle of beer (355 mL), one 5 oz glass of wine (148 mL), or one 1 oz glass of hard liquor (44 mL). Lifestyle Brush your teeth every morning and night with fluoride toothpaste. Floss one time each day. Exercise for at least 30 minutes 5 or more days each week. Do not use any products that contain nicotine or tobacco. These products include cigarettes, chewing tobacco, and vaping devices, such as e-cigarettes. If you need help quitting, ask your health care provider. Do not use drugs. If you are sexually active, practice safe sex. Use a condom or other form of protection to prevent STIs. Take aspirin only as told by your health care provider. Make sure that you understand how much to take and what form to take. Work with your health care provider to find out whether it is safe and beneficial for you to take aspirin daily. Find healthy ways to manage stress, such as: Meditation, yoga, or listening to music. Journaling. Talking to  a trusted person. Spending time with friends and family. Minimize exposure to UV radiation to reduce your risk of skin cancer. Safety Always wear  your seat belt while driving or riding in a vehicle. Do not drive: If you have been drinking alcohol. Do not ride with someone who has been drinking. When you are tired or distracted. While texting. If you have been using any mind-altering substances or drugs. Wear a helmet and other protective equipment during sports activities. If you have firearms in your house, make sure you follow all gun safety procedures. What's next? Go to your health care provider once a year for an annual wellness visit. Ask your health care provider how often you should have your eyes and teeth checked. Stay up to date on all vaccines. This information is not intended to replace advice given to you by your health care provider. Make sure you discuss any questions you have with your health care provider. Document Revised: 04/22/2021 Document Reviewed: 04/22/2021 Elsevier Patient Education  [place].

## 2022-07-17 LAB — CBC
Hematocrit: 40.7 % (ref 37.5–51.0)
Hemoglobin: 14.3 g/dL (ref 13.0–17.7)
MCH: 31 pg (ref 26.6–33.0)
MCHC: 35.1 g/dL (ref 31.5–35.7)
MCV: 88 fL (ref 79–97)
Platelets: 291 10*3/uL (ref 150–450)
RBC: 4.62 x10E6/uL (ref 4.14–5.80)
RDW: 11.9 % (ref 11.6–15.4)
WBC: 4 10*3/uL (ref 3.4–10.8)

## 2022-07-17 LAB — CMP14+EGFR
ALT: 16 IU/L (ref 0–44)
AST: 28 IU/L (ref 0–40)
Albumin/Globulin Ratio: 2.6 — ABNORMAL HIGH (ref 1.2–2.2)
Albumin: 4.7 g/dL (ref 3.8–4.9)
Alkaline Phosphatase: 58 IU/L (ref 44–121)
BUN/Creatinine Ratio: 12 (ref 9–20)
BUN: 13 mg/dL (ref 6–24)
Bilirubin Total: 1.1 mg/dL (ref 0.0–1.2)
CO2: 26 mmol/L (ref 20–29)
Calcium: 10 mg/dL (ref 8.7–10.2)
Chloride: 101 mmol/L (ref 96–106)
Creatinine, Ser: 1.08 mg/dL (ref 0.76–1.27)
Globulin, Total: 1.8 g/dL (ref 1.5–4.5)
Glucose: 86 mg/dL (ref 70–99)
Potassium: 4.3 mmol/L (ref 3.5–5.2)
Sodium: 142 mmol/L (ref 134–144)
Total Protein: 6.5 g/dL (ref 6.0–8.5)
eGFR: 79 mL/min/{1.73_m2} (ref >59)

## 2022-07-17 LAB — LIPID PANEL
Chol/HDL Ratio: 2.5 ratio (ref 0.0–5.0)
Cholesterol, Total: 181 mg/dL (ref 100–199)
HDL: 72 mg/dL (ref >39)
LDL Chol Calc (NIH): 89 mg/dL (ref 0–99)
Triglycerides: 113 mg/dL (ref 0–149)
VLDL Cholesterol Cal: 20 mg/dL (ref 5–40)

## 2022-07-17 LAB — TSH: TSH: 2.55 u[IU]/mL (ref 0.450–4.500)

## 2022-07-17 LAB — PSA: Prostate Specific Ag, Serum: 1.4 ng/mL (ref 0.0–4.0)

## 2022-07-21 ENCOUNTER — Encounter: Payer: Self-pay | Admitting: Family Medicine

## 2022-07-27 DIAGNOSIS — G4733 Obstructive sleep apnea (adult) (pediatric): Secondary | ICD-10-CM | POA: Diagnosis not present

## 2022-08-03 ENCOUNTER — Encounter (INDEPENDENT_AMBULATORY_CARE_PROVIDER_SITE_OTHER): Payer: BC Managed Care – PPO | Admitting: Family Medicine

## 2022-08-03 DIAGNOSIS — F5101 Primary insomnia: Secondary | ICD-10-CM

## 2022-08-03 MED ORDER — DOXEPIN HCL 10 MG PO CAPS: 10.0000 mg | ORAL_CAPSULE | Freq: Every evening | ORAL | 3 refills | Status: DC | PRN

## 2022-08-03 NOTE — Telephone Encounter (Addendum)
Please see the MyChart message reply(ies) for my assessment and plan.   Meds ordered this encounter  Medications   doxepin (SINEQUAN) 10 MG capsule    Sig: Take 1 capsule (10 mg total) by mouth at bedtime as needed (sleep).    Dispense:  90 capsule    Refill:  3      This patient gave consent for this Medical Advice Message and is aware that it may result in a bill to [employer], as well as the possibility of receiving a bill for a co-payment or deductible. They are an established patient, but are not seeking medical advice exclusively about a problem treated during an in person or video visit in the last seven days. I did not recommend an in person or video visit within seven days of my reply.    I spent a total of 5 minutes cumulative time within 7 days through [employer].  Ronnie Doss, DO

## 2022-08-22 ENCOUNTER — Encounter: Payer: Self-pay | Admitting: Urology

## 2022-08-26 ENCOUNTER — Other Ambulatory Visit: Payer: BC Managed Care – PPO

## 2022-08-26 DIAGNOSIS — C61 Malignant neoplasm of prostate: Secondary | ICD-10-CM

## 2022-08-27 LAB — PSA: Prostate Specific Ag, Serum: 1.2 ng/mL (ref 0.0–4.0)

## 2022-09-02 ENCOUNTER — Ambulatory Visit (INDEPENDENT_AMBULATORY_CARE_PROVIDER_SITE_OTHER): Payer: BC Managed Care – PPO | Admitting: Urology

## 2022-09-02 ENCOUNTER — Encounter: Payer: Self-pay | Admitting: Urology

## 2022-09-02 VITALS — BP 113/70 | HR 84

## 2022-09-02 DIAGNOSIS — R351 Nocturia: Secondary | ICD-10-CM

## 2022-09-02 DIAGNOSIS — C61 Malignant neoplasm of prostate: Secondary | ICD-10-CM

## 2022-09-02 DIAGNOSIS — R93422 Abnormal radiologic findings on diagnostic imaging of left kidney: Secondary | ICD-10-CM

## 2022-09-02 DIAGNOSIS — N401 Enlarged prostate with lower urinary tract symptoms: Secondary | ICD-10-CM

## 2022-09-02 DIAGNOSIS — N138 Other obstructive and reflux uropathy: Secondary | ICD-10-CM

## 2022-09-02 DIAGNOSIS — N200 Calculus of kidney: Secondary | ICD-10-CM

## 2022-09-02 DIAGNOSIS — R972 Elevated prostate specific antigen [PSA]: Secondary | ICD-10-CM | POA: Diagnosis not present

## 2022-09-02 NOTE — Progress Notes (Signed)
Subjective: 1. Prostate cancer ([place])   2. Elevated PSA   3. BPH with urinary obstruction   4. Nocturia   5. Renal stone        Samuel Powers is a 61 yo male who is sent by Samuel Powers for gross hematuria after a walk with >30 RBC's on a UA on 04/24/20.  His Cr was 0.92 on 04/09/20.  He had it again over 05/11/20.  He has not seen it since.  He has intermittent urgency with some intermittency or straining to void.   He subsequently has had some right flank pain.  His UA today is clear.   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  He has no other GU history or complaints.   07/25/20:  Samuel Powers returns today in f/u.   He continues to have some right flank pain that is more of a discomfort and he has some frequency and urgency with straining to void.  His PSA is 3.8.  He had a CT that showed a 2m bladder stone.  With bladder wall thickening and trilobar hyperplasia.  His IPSS remains elevated at 29.  09/05/20: [name] today in f/u from recent cystoscopy with bladder stone removal.  He is doing well with improvement in the voiding symptoms.  He has an improved stream with reduced intermittency.  He has had no hematuria or dysuria.   His prostate biopsy was negative.  The prostate volume was 270m   He remains on alfuzosin.  His IPSS has declined to 6 from 2977 03/05/21: Samuel Powers today in f/u for his history of bladder stones and an elevated PSA with negative prostate biopsy on 08/14/20.  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  HIs IPSS is 7 and his UA is clear.  He didn't get the PSA that was ordered for this visit.  05/07/21: [name] today in f/u for his history of an elevated PSA with a negative biopsy in 10/21.  His PSA has continued to rise and was  5.4 on 03/05/21 with a 9.1% f/t ratio and a repeat on 04/17/21 was 6.9.  He had an  MRIP on 05/02/21 and it showed a 2379mrostate with a small PIRADS 4 lesion in the right anterior TZ.    06/11/21: Samuel Powers today to discuss his recent MR fusion biopsy.   He was found to have a 79m44mostate with Gleason 7(3+4) disease in 2/3 ROI biopsies and Gleason 6 in 1/3 ROI biopsies.   He also had Gleason 7(3+4) in the RMM and RAM biopsies and Gleason 6 in the RML and RAL as well as the LAM biopsies.   His most recent PSA is 6.9.   [facility] 60% OCD, 39% ECE, 4% LNI and 4% SVI.  03/04/22: Samuel Powers today in f/u for his history of prostate cancer treated with seeds on 10/13/21.  His PSA is down to 2.8 from 6.9 preop.   He remains on alfuzosin and has moderate LUTS but they are improving.  His IPSS is 18 with nocturia x 2 which has improved since he switched the alfuzosin to qhs.  He has had no hematuria.  He has had no GI issues.  He had a renal [place] that showed a possible 6mm 39m stone.  He has some chronic bladder wall thickening.  His UA  is clear. He has no associated signs or symptoms.     09/02/22: Samuel Powers returns today in f/u.  His PSA continues to decline and was 1.4 in 9/23 and 1.2 this month.  He has had improvement in the LUTS but still has some nocturia x 3 and remains on alfuzosin at bedtime.  His IPSS is 13.  He has had no hematuria.  He has a known LUP stone but no flank pain.       IPSS     Row Name 09/02/22 0900         International Prostate Symptom Score   How often have you had the sensation of not emptying your bladder? Less than half the time     How often have you had to urinate less than every two hours? Less than half the time     How often have you found you stopped and started again several times when you urinated? Less than half the time     How often have you found it difficult to postpone urination? Less than 1 in 5 times     How often have you had a weak urinary stream? Less than half the time     How often have you had to strain to start  urination? Less than 1 in 5 times     How many times did you typically get up at night to urinate? 3 Times     Total IPSS Score 13       Quality of Life due to urinary symptoms   If you were to spend the rest of your life with your urinary condition just the way it is now how would you feel about that? Mostly Satisfied               ROS:  Review of Systems  All other systems reviewed and are negative.   Allergies  Allergen Reactions   Codeine     Weird dreams and light headed    Past Medical History:  Diagnosis Date   Allergy    Anxiety    BLADDER STONE 2021   Depression    Elevated PSA since june 2021   Hyperlipidemia    Prostate cancer ([place])    Sleep apnea    DID NOT TOLERATE CPAP OR MOUTH GUARD MODERATE OSA SLEEP STUDY 2017   Wears glasses 10/07/2021   OR CONTACTS    Past Surgical History:  Procedure Laterality Date   COLONOSCOPY  09/27/2021   CYSTOSCOPY N/A 10/13/2021   Procedure: CYSTOSCOPY FLEXIBLE;  Surgeon: Samuel Seal, MD;  Location[facility];  Service: Urology;  Laterality: N/A;  no seeds found in bladder   CYSTOSCOPY WITH LITHOLAPAXY N/A 08/14/2020   Procedure: CYSTOSCOPY WITH REMOVAL OF BLADDER STONE;  Surgeon: Samuel Seal, MD;  Location[facility];  Service: Urology;  Laterality: N/A;   POLYPECTOMY     PROSTATE BIOPSY N/A 08/14/2020   Procedure: BIOPSY TRANSRECTAL ULTRASONIC PROSTATE (TUBP);  Surgeon: Samuel Seal, MD;  Location: [facility];  Service: Urology;  Laterality: N/A;   RADIOACTIVE SEED IMPLANT N/A 10/13/2021   Procedure: RADIOACTIVE SEED IMPLANT/BRACHYTHERAPY IMPLANT;  Surgeon: Samuel Seal, MD;  Location: [facility];  Service: Urology;  Laterality: N/A;  67   seeds implanted   SPACE OAR INSTILLATION N/A 10/13/2021   Procedure: SPACE OAR INSTILLATION;  Surgeon: Samuel Seal, MD;  Location: [facility];  Service: Urology;  Laterality: N/A;   TONSILLECTOMY Bilateral  2000    Social History   Socioeconomic History   Marital status: Single    Spouse name: Not on file   Number of children: Not on file   Years of education: Not on file   Highest education level: Not on file  Occupational History   Occupation: Engineer, maintenance (IT)  Tobacco Use   Smoking status: Never   Smokeless tobacco: Never  Vaping Use   Vaping Use: Never used  Substance and Sexual Activity   Alcohol use: Not Currently   Drug use: Never   Sexual activity: Not on file  Other Topics Concern   Not on file  Social History Narrative   Recently relocated back home from [place] to be closer to his mother, who resides in [place].   Social Determinants of Health   Financial Resource Strain: Not on file  Food Insecurity: Not on file  Transportation Needs: Not on file  Physical Activity: Not on file  Stress: Not on file  Social Connections: Not on file  Intimate Partner Violence: Not on file    Family History  Problem Relation Age of Onset   COPD Mother    Diabetes Mother    Hypertension Mother    Alcohol abuse Father    COPD Brother    Colon cancer Neg Hx    Rectal cancer Neg Hx    Stomach cancer Neg Hx     Anti-infectives: Anti-infectives (From admission, onward)    None       Current Outpatient Medications  Medication Sig Dispense Refill   alfuzosin (UROXATRAL) 10 MG 24 hr tablet Take 1 tablet (10 mg total) by mouth daily with breakfast. 90 tablet 3   cetirizine (ZYRTEC) 10 MG tablet Take 10 mg by mouth daily.     doxepin (SINEQUAN) 10 MG capsule Take 1 capsule (10 mg total) by mouth at bedtime as needed (sleep). 90 capsule 3   fluticasone (FLONASE) 50 MCG/ACT nasal spray Place 1 spray into both nostrils daily.     montelukast (SINGULAIR) 10 MG tablet Take 1 tablet (10 mg total) by mouth at bedtime. 90 tablet 3   rosuvastatin (CRESTOR) 10 MG tablet Take 1 tablet (10 mg total) by mouth at bedtime. 90 tablet 3   SF 5000 PLUS 1.1 % CREA dental cream Take by mouth at  bedtime. FLOURIDE CREAM     valACYclovir (VALTREX) 1000 MG tablet Take 1,000 mg by mouth as needed.     No current facility-administered medications for this visit.     Objective: Vital signs in last 24 hours: BP 113/70   Pulse 84   Intake/Output from previous day: No intake/output data recorded. Intake/Output this shift: '@IOTHISSHIFT'$ @   Physical Exam Vitals reviewed.  Constitutional:      Appearance: Normal appearance.  Genitourinary:    Comments: AP without lesions. NST without mass. Prostate 2+ smooth and firm. SV's non-palpable.  Neurological:     Mental Status: He is alert.     Lab Results:   No results found for this or any previous visit (from the past 24 hour(s)).   Lab Results  Component Value Date   PSA1 1.2 08/26/2022   PSA1 1.4 07/16/2022   PSA1 2.8 02/25/2022   UA is clear.  Studies/Results: No results found.   Assessment/Plan Prostate cancer.   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  He is doing well s/p seeds with a further decline in the PSA.  BPH with BOO.  His symptoms are improving.  He will remain on alfuzosin.   Renal stone.   He had a possible 6 mm left renal stone on imaging at his last visit that wasn't present on his CT in 2021.  I will get a KUB at 6 month f/u as previously ordered.   No orders of the defined types were placed in this encounter.    Orders Placed This Encounter  Procedures   Urinalysis, Routine w reflex microscopic   PSA    Standing Status:   Future    Standing Expiration Date:   09/03/2023     Return in about 6 months (around 03/04/2023) for He should have f/u scheduled with a KUB 6 months already.  Will need a PSA as well. .    CC: Dr. Adam Phenix.      Samuel Powers 09/02/2022 [phone]

## 2022-09-03 LAB — URINALYSIS, ROUTINE W REFLEX MICROSCOPIC
Bilirubin, UA: NEGATIVE
Glucose, UA: NEGATIVE
Ketones, UA: NEGATIVE
Leukocytes,UA: NEGATIVE
Nitrite, UA: NEGATIVE
Protein,UA: NEGATIVE
RBC, UA: NEGATIVE
Specific Gravity, UA: 1.025 (ref 1.005–1.030)
Urobilinogen, Ur: 0.2 mg/dL (ref 0.2–1.0)
pH, UA: 5.5 (ref 5.0–7.5)

## 2022-09-10 ENCOUNTER — Ambulatory Visit (INDEPENDENT_AMBULATORY_CARE_PROVIDER_SITE_OTHER): Payer: BC Managed Care – PPO

## 2022-09-10 DIAGNOSIS — Z23 Encounter for immunization: Secondary | ICD-10-CM | POA: Diagnosis not present

## 2022-09-22 ENCOUNTER — Encounter: Payer: Self-pay | Admitting: Urology

## 2022-09-22 ENCOUNTER — Encounter: Payer: Self-pay | Admitting: Family Medicine

## 2022-09-23 ENCOUNTER — Ambulatory Visit (INDEPENDENT_AMBULATORY_CARE_PROVIDER_SITE_OTHER): Payer: BC Managed Care – PPO | Admitting: Physician Assistant

## 2022-09-23 VITALS — BP 126/82 | HR 83

## 2022-09-23 DIAGNOSIS — N138 Other obstructive and reflux uropathy: Secondary | ICD-10-CM | POA: Diagnosis not present

## 2022-09-23 DIAGNOSIS — R300 Dysuria: Secondary | ICD-10-CM

## 2022-09-23 DIAGNOSIS — N401 Enlarged prostate with lower urinary tract symptoms: Secondary | ICD-10-CM

## 2022-09-23 DIAGNOSIS — C61 Malignant neoplasm of prostate: Secondary | ICD-10-CM

## 2022-09-23 LAB — BLADDER SCAN AMB NON-IMAGING: Scan Result: 54

## 2022-09-23 NOTE — Progress Notes (Signed)
Assessment: 1. BPH with urinary obstruction - Urinalysis, Routine w reflex microscopic - BLADDER SCAN AMB NON-IMAGING  2. Prostate cancer ([place])  3. Dysuria    Plan: It is reassured that his urine is clear without evidence of infection.  He would like to avoid medications at this time to see if symptoms resolve on their own.  Keep follow-up as scheduled with Dr. Jeffie Pollock.  If symptoms progress, will consider treatment for acute prostatitis.  12:31 PM 09/27/22 Patient reached out to this provider personally requesting prescription called in as symptoms have progressed without resolution.  Doxycycline for 3 weeks sent to the patient's pharmacy.  Follow-up recommended with Dr. Jeffie Pollock if symptoms persist.  Chief Complaint: No chief complaint on file.   HPI: Samuel Powers is a 61 y.o. male with h/o urinary bladder stone, BPH and prostate CA who presents for  evaluation of urinary symptoms of frequency, "irritation" while voiding, intermittency, and weakness of stream for the past few days.  Patient denies "pain" as a symptom.  No gross hematuria or increase in nocturia.  No fever, chills, abdominal pain.  Patient is taking alfuzosin as previously prescribed.  UA= clear PVR= 54 mL IPSS= 12          QOL= 2  Portions of the above documentation were copied from a prior visit for review purposes only.  Allergies: Allergies  Allergen Reactions   Codeine     Weird dreams and light headed    PMH: Past Medical History:  Diagnosis Date   Allergy    Anxiety    BLADDER STONE 2021   Depression    Elevated PSA since june 2021   Hyperlipidemia    Prostate cancer ([place])    Sleep apnea    DID NOT TOLERATE CPAP OR MOUTH GUARD MODERATE OSA SLEEP STUDY 2017   Wears glasses 10/07/2021   OR CONTACTS    PSH: Past Surgical History:  Procedure Laterality Date   COLONOSCOPY  09/27/2021   CYSTOSCOPY N/A 10/13/2021   Procedure: CYSTOSCOPY FLEXIBLE;  Surgeon: Irine Seal, MD;  Location: [facility];  Service: Urology;  Laterality: N/A;  no seeds found in bladder   CYSTOSCOPY WITH LITHOLAPAXY N/A 08/14/2020   Procedure: CYSTOSCOPY WITH REMOVAL OF BLADDER STONE;  Surgeon: Irine Seal, MD;  Location: [facility];  Service: Urology;  Laterality: N/A;   POLYPECTOMY     PROSTATE BIOPSY N/A 08/14/2020   Procedure: BIOPSY TRANSRECTAL ULTRASONIC PROSTATE (TUBP);  Surgeon: Irine Seal, MD;  Location: [facility];  Service: Urology;  Laterality: N/A;   RADIOACTIVE SEED IMPLANT N/A 10/13/2021   Procedure: RADIOACTIVE SEED IMPLANT/BRACHYTHERAPY IMPLANT;  Surgeon: Irine Seal, MD;  Location: [facility];  Service: Urology;  Laterality: N/A;  67   seeds implanted   SPACE OAR INSTILLATION N/A 10/13/2021   Procedure: SPACE OAR INSTILLATION;  Surgeon: Irine Seal, MD;  Location: [facility];  Service: Urology;  Laterality: N/A;   TONSILLECTOMY Bilateral 2000    SH: Social History   Tobacco Use   Smoking status: Never   Smokeless tobacco: Never  Vaping Use   Vaping Use: Never used  Substance Use Topics   Alcohol use: Not Currently   Drug use: Never    ROS: All other review of systems were reviewed and are negative except what is noted above in HPI  PE: BP 126/82   Pulse 83  GENERAL APPEARANCE:  Well appearing, well developed, well nourished, NAD HEENT:  Atraumatic, normocephalic NECK:  Supple. Trachea midline ABDOMEN:  Soft, non-tender, no masses EXTREMITIES:  Moves all extremities well, without clubbing, cyanosis, or edema NEUROLOGIC:  Alert and oriented x 3, normal gait, CN II-XII grossly intact MENTAL STATUS:  appropriate BACK:  Non-tender to palpation, No CVAT SKIN:  Warm, dry, and intact   Results: Laboratory Data: Lab Results  Component Value Date   WBC 4.0 07/16/2022   HGB 14.3 07/16/2022   HCT 40.7 07/16/2022   MCV 88 07/16/2022   PLT 291 07/16/2022    Lab Results  Component Value Date    CREATININE 1.08 07/16/2022    Urinalysis    Component Value Date/Time   APPEARANCEUR Clear 09/02/2022 1046   GLUCOSEU Negative 09/02/2022 1046   BILIRUBINUR Negative 09/02/2022 1046   PROTEINUR Negative 09/02/2022 1046   NITRITE Negative 09/02/2022 1046   LEUKOCYTESUR Negative 09/02/2022 1046    Lab Results  Component Value Date   LABMICR Comment 09/02/2022   WBCUA None seen 11/21/2020   LABEPIT 0-10 11/21/2020   BACTERIA Few 11/21/2020    Pertinent Imaging: No results found for this or any previous visit.  No results found for this or any previous visit.  No results found for this or any previous visit.  No results found for this or any previous visit.  Results for orders placed during the hospital encounter of 02/25/22  US RENAL  Narrative CLINICAL DATA:  History of nephrolithiasis.  EXAM: RENAL / URINARY TRACT ULTRASOUND COMPLETE  COMPARISON:  CT abdomen pelvis July 11, 2020  FINDINGS: Right Kidney:  Renal measurements: 10.7 x 5.4 x 5.7 cm = volume: 174.3 mL. Echogenicity within normal limits. No mass or hydronephrosis visualized.  Left Kidney:  Renal measurements: 10.6 x 6.4 x 4.9 cm = volume: 175.5 mL. Normal renal cortical thickness and echogenicity. No hydronephrosis. Suggestion of a possible 6 mm stone within the superior pole of the left kidney.  Bladder:  Predominantly distended with suggestion of circumferential wall thickening as demonstrated on prior CT.  Other:  None.  IMPRESSION: No hydronephrosis.  Possible 6 mm stone superior pole left kidney.  Persistent circumferential wall thickening of the urinary bladder, nonspecific.   Electronically Signed By: Lovey Newcomer M.D. On: 02/26/2022 12:58  No valid procedures specified. Results for orders placed during the hospital encounter of 07/11/20  CT HEMATURIA WORKUP  Narrative CLINICAL DATA:  Gross hematuria  EXAM: CT ABDOMEN AND PELVIS WITHOUT AND WITH  CONTRAST  TECHNIQUE: Multidetector CT imaging of the abdomen and pelvis was performed following the standard protocol before and following the bolus administration of intravenous contrast.  CONTRAST:  181m OMNIPAQUE IOHEXOL 300 MG/ML  SOLN  COMPARISON:  None.  FINDINGS: Lower chest: No acute abnormality.  Hepatobiliary: No solid liver abnormality is seen. No gallstones, gallbladder wall thickening, or biliary dilatation.  Pancreas: Unremarkable. No pancreatic ductal dilatation or surrounding inflammatory changes.  Spleen: Normal in size without significant abnormality.  Adrenals/Urinary Tract: Adrenal glands are unremarkable. Kidneys are normal, without renal calculi, solid lesion, or hydronephrosis. Thickening of the urinary bladder wall. There is a 7 mm calculus in the dependent urinary bladder. No other filling defect on delayed phase imaging.  Stomach/Bowel: Stomach is within normal limits. Appendix appears normal. No evidence of bowel wall thickening, distention, or inflammatory changes.  Vascular/Lymphatic: No significant vascular findings are present. No enlarged abdominal or pelvic lymph nodes.  Reproductive: No mass or other significant abnormality.  Other: No abdominal wall hernia or abnormality. No abdominopelvic ascites.  Musculoskeletal:  No acute or significant osseous findings.  IMPRESSION: 1. Thickening of the urinary bladder wall, generally greater than expected for chronic outlet obstruction and suggestive of nonspecific infectious or inflammatory cystitis. Correlate with urinalysis. 2. There is a 7 mm calculus in the dependent urinary bladder. No other evidence of urinary tract calculus.   Electronically Signed By: Eddie Candle M.D. On: 07/11/2020 09:00  No results found for this or any previous visit.  No results found for this or any previous visit (from the past 24 hour(s)).

## 2022-09-23 NOTE — Progress Notes (Signed)
post void residual=54 

## 2022-09-24 LAB — URINALYSIS, ROUTINE W REFLEX MICROSCOPIC
Bilirubin, UA: NEGATIVE
Glucose, UA: NEGATIVE
Ketones, UA: NEGATIVE
Leukocytes,UA: NEGATIVE
Nitrite, UA: NEGATIVE
Protein,UA: NEGATIVE
RBC, UA: NEGATIVE
Specific Gravity, UA: 1.03 (ref 1.005–1.030)
Urobilinogen, Ur: 0.2 mg/dL (ref 0.2–1.0)
pH, UA: 5.5 (ref 5.0–7.5)

## 2022-09-27 MED ORDER — DOXYCYCLINE HYCLATE 100 MG PO CAPS: 100.0000 mg | ORAL_CAPSULE | Freq: Two times a day (BID) | ORAL | 0 refills | Status: DC

## 2022-09-30 ENCOUNTER — Encounter: Payer: Self-pay | Admitting: Family Medicine

## 2022-10-25 ENCOUNTER — Encounter: Payer: Self-pay | Admitting: Family Medicine

## 2022-10-26 ENCOUNTER — Encounter: Payer: Self-pay | Admitting: Family Medicine

## 2022-10-27 ENCOUNTER — Encounter: Payer: Self-pay | Admitting: Family Medicine

## 2022-10-28 ENCOUNTER — Other Ambulatory Visit: Payer: Self-pay | Admitting: Family Medicine

## 2022-10-28 DIAGNOSIS — Z20828 Contact with and (suspected) exposure to other viral communicable diseases: Secondary | ICD-10-CM

## 2022-10-28 MED ORDER — OSELTAMIVIR PHOSPHATE 75 MG PO CAPS: 75.0000 mg | ORAL_CAPSULE | Freq: Every day | ORAL | 0 refills | Status: AC

## 2023-01-24 ENCOUNTER — Encounter: Payer: Self-pay | Admitting: Urology

## 2023-01-26 ENCOUNTER — Telehealth: Payer: Self-pay

## 2023-01-26 NOTE — Telephone Encounter (Signed)
Patient is aware to do urine drop off for UTI sxs. Patient voiced understanding

## 2023-01-27 ENCOUNTER — Ambulatory Visit (INDEPENDENT_AMBULATORY_CARE_PROVIDER_SITE_OTHER): Payer: BC Managed Care – PPO | Admitting: Urology

## 2023-01-27 ENCOUNTER — Telehealth: Payer: Self-pay

## 2023-01-27 DIAGNOSIS — N390 Urinary tract infection, site not specified: Secondary | ICD-10-CM

## 2023-01-27 DIAGNOSIS — R39198 Other difficulties with micturition: Secondary | ICD-10-CM

## 2023-01-27 LAB — URINALYSIS, ROUTINE W REFLEX MICROSCOPIC
Bilirubin, UA: NEGATIVE
Glucose, UA: NEGATIVE
Ketones, UA: NEGATIVE
Leukocytes,UA: NEGATIVE
Nitrite, UA: NEGATIVE
Protein,UA: NEGATIVE
RBC, UA: NEGATIVE
Specific Gravity, UA: 1.015 (ref 1.005–1.030)
Urobilinogen, Ur: 0.2 mg/dL (ref 0.2–1.0)
pH, UA: 7 (ref 5.0–7.5)

## 2023-01-27 NOTE — Telephone Encounter (Signed)
-----   Message from Irine Seal, MD sent at 01/27/2023  9:14 AM EDT ----- Regarding: RE: UTI SXS There is no infection.  He is on alfuzosin so if he continues to have irritation, he could use and over the counter anti inflammatory like aleve or advil as needed.   ----- Message ----- From: Sherrilyn Rist, CMA Sent: 01/27/2023   9:07 AM EDT To: Irine Seal, MD Subject: UTI SXS                                        Please see below patient's response. Patient drop off urine 03/21.Review with Dr. Jeffie Pollock and  I will sent UA results once it result in system.  Good morning, I have been experiencing some irritation when urinating the last couple of days - this seems to be the same type of irritation I experienced back in November when I saw PA Summerlin and she prescribed doxycycline.  I would NOT describe it as pain or burning, it's just an irritating feeling when I urinate. Just wondering if I should continue to monitor it or if I should get some labwork done via a urine sample to see if I have some sort of infection?  I see you in mid April for a PSA follow up, but didn't know if I should wait that late to mention this. Thoughts? Thanks, [employer]

## 2023-01-27 NOTE — Telephone Encounter (Signed)
Patient is aware of the MD response and voiced understanding

## 2023-01-27 NOTE — Progress Notes (Addendum)
Patient presents today with complaints of  irritation when urinating the last couple of days.  UA  done today.  Dr. Jeffie Pollock reviewed results and no treatment started .  Patient aware of MD recommendations and my chart message sent to MD for review and UA. [id number], [place]

## 2023-02-01 ENCOUNTER — Encounter: Payer: Self-pay | Admitting: Urology

## 2023-02-01 DIAGNOSIS — H35363 Drusen (degenerative) of macula, bilateral: Secondary | ICD-10-CM | POA: Diagnosis not present

## 2023-02-01 NOTE — Telephone Encounter (Signed)
Please see Mychart message below.  Last ua clear on 03/21 no culture ordered.  Do you recommend pt come in for a ua and culture or OV for next available.

## 2023-02-17 ENCOUNTER — Other Ambulatory Visit: Payer: BC Managed Care – PPO

## 2023-02-17 DIAGNOSIS — C61 Malignant neoplasm of prostate: Secondary | ICD-10-CM | POA: Diagnosis not present

## 2023-02-18 LAB — PSA: Prostate Specific Ag, Serum: 1.3 ng/mL (ref 0.0–4.0)

## 2023-02-21 ENCOUNTER — Telehealth: Payer: Self-pay

## 2023-02-21 NOTE — Telephone Encounter (Signed)
Patient aware of MD response and will keep follow up as scheduled on 04/18

## 2023-02-21 NOTE — Telephone Encounter (Signed)
-----   Message from Bjorn Pippin, MD sent at 02/18/2023 11:42 AM EDT ----- His PSA is minimally increased but this about the time that we often see a little bump in the PSA after a seed implant.  ----- Message ----- From: Grier Rocher, CMA Sent: 02/18/2023   8:34 AM EDT To: Bjorn Pippin, MD  F/U 04/18

## 2023-02-23 ENCOUNTER — Ambulatory Visit (HOSPITAL_COMMUNITY)
Admission: RE | Admit: 2023-02-23 | Discharge: 2023-02-23 | Disposition: A | Discharge status: Home / Self Care | Payer: BC Managed Care – PPO | Source: Ambulatory Visit | Attending: Urology | Admitting: Urology

## 2023-02-23 DIAGNOSIS — N200 Calculus of kidney: Secondary | ICD-10-CM | POA: Insufficient documentation

## 2023-02-23 DIAGNOSIS — R109 Unspecified abdominal pain: Secondary | ICD-10-CM | POA: Diagnosis not present

## 2023-02-24 ENCOUNTER — Encounter: Payer: Self-pay | Admitting: Urology

## 2023-02-24 ENCOUNTER — Other Ambulatory Visit: Payer: BC Managed Care – PPO

## 2023-02-24 ENCOUNTER — Ambulatory Visit (INDEPENDENT_AMBULATORY_CARE_PROVIDER_SITE_OTHER): Payer: BC Managed Care – PPO | Admitting: Urology

## 2023-02-24 VITALS — BP 120/78 | HR 80 | Ht 70.0 in | Wt 168.0 lb

## 2023-02-24 DIAGNOSIS — Z8546 Personal history of malignant neoplasm of prostate: Secondary | ICD-10-CM

## 2023-02-24 DIAGNOSIS — N401 Enlarged prostate with lower urinary tract symptoms: Secondary | ICD-10-CM | POA: Diagnosis not present

## 2023-02-24 DIAGNOSIS — R351 Nocturia: Secondary | ICD-10-CM | POA: Diagnosis not present

## 2023-02-24 DIAGNOSIS — N138 Other obstructive and reflux uropathy: Secondary | ICD-10-CM

## 2023-02-24 DIAGNOSIS — C61 Malignant neoplasm of prostate: Secondary | ICD-10-CM

## 2023-02-24 DIAGNOSIS — R93422 Abnormal radiologic findings on diagnostic imaging of left kidney: Secondary | ICD-10-CM

## 2023-02-24 LAB — URINALYSIS, ROUTINE W REFLEX MICROSCOPIC
Bilirubin, UA: NEGATIVE
Glucose, UA: NEGATIVE
Ketones, UA: NEGATIVE
Leukocytes,UA: NEGATIVE
Nitrite, UA: NEGATIVE
Protein,UA: NEGATIVE
RBC, UA: NEGATIVE
Specific Gravity, UA: 1.025 (ref 1.005–1.030)
Urobilinogen, Ur: 0.2 mg/dL (ref 0.2–1.0)
pH, UA: 6 (ref 5.0–7.5)

## 2023-02-24 MED ORDER — ALFUZOSIN HCL ER 10 MG PO TB24: 10.0000 mg | ORAL_TABLET | Freq: Every day | ORAL | 3 refills | Status: DC

## 2023-02-24 NOTE — Progress Notes (Signed)
Subjective: 1. History of prostate cancer   2. BPH with urinary obstruction   3. Nocturia        Samuel Powers is a 62 yo male who is sent by Dr. Nadine Counts for gross hematuria after a walk with >30 RBC's on a UA on 04/24/20.  His Cr was 0.92 on 04/09/20.  He had it again over 05/11/20.  He has not seen it since.  He has intermittent urgency with some intermittency or straining to void.   He subsequently has had some right flank pain.  His UA today is clear.   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  He has no other GU history or complaints.   07/25/20:  Samuel Powers returns today in f/u.   He continues to have some right flank pain that is more of a discomfort and he has some frequency and urgency with straining to void.  His PSA is 3.8.  He had a CT that showed a 7mm bladder stone.  With bladder wall thickening and trilobar hyperplasia.  His IPSS remains elevated at 29.  09/05/20: Samuel Powers returns today in f/u from recent cystoscopy with bladder stone removal.  He is doing well with improvement in the voiding symptoms.  He has an improved stream with reduced intermittency.  He has had no hematuria or dysuria.   His prostate biopsy was negative.  The prostate volume was 28ml.   He remains on alfuzosin.  His IPSS has declined to 6 from 45.  03/05/21: Samuel Powers returns today in f/u for his history of bladder stones and an elevated PSA with negative prostate biopsy on 08/14/20.  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  HIs IPSS is 7 and his UA is clear.  He didn't get the PSA that was ordered for this visit.  05/07/21: Samuel Powers returns today in f/u for his history of an elevated PSA with a negative biopsy in 10/21.  His PSA has continued to rise and was  5.4 on 03/05/21 with a 9.1% f/t ratio and a repeat on 04/17/21 was 6.9.  He had an MRIP on 05/02/21 and it showed a  23ml prostate with a small PIRADS 4 lesion in the right anterior TZ.    06/11/21: Samuel Powers returns today to discuss his recent MR fusion biopsy.   He was found to have a 26ml prostate with Gleason 7(3+4) disease in 2/3 ROI biopsies and Gleason 6 in 1/3 ROI biopsies.   He also had Gleason 7(3+4) in the RMM and RAM biopsies and Gleason 6 in the RML and RAL as well as the LAM biopsies.   His most recent PSA is 6.9.   [facility] 60% OCD, 39% ECE, 4% LNI and 4% SVI.  03/04/22: Samuel Powers returns today in f/u for his history of prostate cancer treated with seeds on 10/13/21.  His PSA is down to 2.8 from 6.9 preop.   He remains on alfuzosin and has moderate LUTS but they are improving.  His IPSS is 18 with nocturia x 2 which has improved since he switched the alfuzosin to qhs.  He has had no hematuria.  He has had no GI issues.  He had a renal US on 02/25/22 that showed a possible 6mm LUP stone.  He has some chronic bladder wall thickening.  His UA is clear. He has no associated signs or symptoms.  09/02/22: Samuel Powers returns today in f/u.  His PSA continues to decline and was 1.4 in 9/23 and 1.2 this month.  He has had improvement in the LUTS but still has some nocturia x 3 and remains on alfuzosin at bedtime.  His IPSS is 13.  He has had no hematuria.  He has a known LUP stone but no flank pain.     02/24/23: Samuel Powers returns today in f/u.  He is s/p seed implant in 12/22.  His PSA is 1.3 which is minimally changed.  He remains on alfuzosin for BPH with BOO and his IPSS is 11 with nocturia x 2. He has a left renal stone that was last seen on [place] in 11/23 but no flank pain or hematuria.  He has no GI complaints or weight loss.   He has preserved erectile function.   He had a KUB prior to this visit and there is a possible small left mid to upper pole stone.   His UA is clear.     IPSS     Row Name 02/24/23 0900         International Prostate Symptom Score   How often have you had the sensation of not emptying your  bladder? Less than half the time     How often have you had to urinate less than every two hours? Less than half the time     How often have you found you stopped and started again several times when you urinated? Less than half the time     How often have you found it difficult to postpone urination? Less than 1 in 5 times     How often have you had a weak urinary stream? Less than 1 in 5 times     How often have you had to strain to start urination? Less than 1 in 5 times     How many times did you typically get up at night to urinate? 2 Times     Total IPSS Score 11       Quality of Life due to urinary symptoms   If you were to spend the rest of your life with your urinary condition just the way it is now how would you feel about that? Mostly Satisfied               ROS:  Review of Systems  All other systems reviewed and are negative.   Allergies  Allergen Reactions   Codeine     Weird dreams and light headed    Past Medical History:  Diagnosis Date   Allergy    Anxiety    BLADDER STONE 2021   Depression    Elevated PSA since june 2021   Hyperlipidemia    Prostate cancer    Sleep apnea    DID NOT TOLERATE CPAP OR MOUTH GUARD MODERATE OSA SLEEP STUDY 2017   Wears glasses 10/07/2021   OR CONTACTS    Past Surgical History:  Procedure Laterality Date   COLONOSCOPY  09/27/2021   CYSTOSCOPY N/A 10/13/2021   Procedure: CYSTOSCOPY FLEXIBLE;  Surgeon: Bjorn Pippin, MD;  Location: [facility];  Service: Urology;  Laterality: N/A;  no seeds found in bladder   CYSTOSCOPY WITH LITHOLAPAXY N/A 08/14/2020   Procedure: CYSTOSCOPY WITH REMOVAL OF BLADDER STONE;  Surgeon: Bjorn Pippin, MD;  Location: [facility];  Service: Urology;  Laterality: N/A;   POLYPECTOMY     PROSTATE BIOPSY N/A 08/14/2020  Procedure: BIOPSY TRANSRECTAL ULTRASONIC PROSTATE (TUBP);  Surgeon: Bjorn Pippin, MD;  Location: [facility];  Service: Urology;   Laterality: N/A;   RADIOACTIVE SEED IMPLANT N/A 10/13/2021   Procedure: RADIOACTIVE SEED IMPLANT/BRACHYTHERAPY IMPLANT;  Surgeon: Bjorn Pippin, MD;  Location: [facility];  Service: Urology;  Laterality: N/A;  67   seeds implanted   SPACE OAR INSTILLATION N/A 10/13/2021   Procedure: SPACE OAR INSTILLATION;  Surgeon: Bjorn Pippin, MD;  Location: [facility];  Service: Urology;  Laterality: N/A;   TONSILLECTOMY Bilateral 2000    Social History   Socioeconomic History   Marital status: Single    Spouse name: Not on file   Number of children: Not on file   Years of education: Not on file   Highest education level: Not on file  Occupational History   Occupation: IT trainer  Tobacco Use   Smoking status: Never   Smokeless tobacco: Never  Vaping Use   Vaping Use: Never used  Substance and Sexual Activity   Alcohol use: Not Currently   Drug use: Never   Sexual activity: Not on file  Other Topics Concern   Not on file  Social History Narrative   Recently relocated back home from [place] to be closer to his mother, who resides in [place].   Social Determinants of Health   Financial Resource Strain: Not on file  Food Insecurity: Not on file  Transportation Needs: Not on file  Physical Activity: Not on file  Stress: Not on file  Social Connections: Not on file  Intimate Partner Violence: Not on file    Family History  Problem Relation Age of Onset   COPD Mother    Diabetes Mother    Hypertension Mother    Alcohol abuse Father    COPD Brother    Colon cancer Neg Hx    Rectal cancer Neg Hx    Stomach cancer Neg Hx     Anti-infectives: Anti-infectives (From admission, onward)    None       Current Outpatient Medications  Medication Sig Dispense Refill   cetirizine (ZYRTEC) 10 MG tablet Take 10 mg by mouth daily.     doxepin (SINEQUAN) 10 MG capsule Take 1 capsule (10 mg total) by mouth at bedtime as needed (sleep). 90 capsule 3    fluticasone (FLONASE) 50 MCG/ACT nasal spray Place 1 spray into both nostrils daily.     montelukast (SINGULAIR) 10 MG tablet Take 1 tablet (10 mg total) by mouth at bedtime. 90 tablet 3   rosuvastatin (CRESTOR) 10 MG tablet Take 1 tablet (10 mg total) by mouth at bedtime. 90 tablet 3   SF 5000 PLUS 1.1 % CREA dental cream Take by mouth at bedtime. FLOURIDE CREAM     valACYclovir (VALTREX) 1000 MG tablet Take 1,000 mg by mouth as needed.     alfuzosin (UROXATRAL) 10 MG 24 hr tablet Take 1 tablet (10 mg total) by mouth daily with breakfast. 90 tablet 3   No current facility-administered medications for this visit.     Objective: Vital signs in last 24 hours: BP 120/78   Pulse 80   Ht 5\' 10"  (1.778 m)   Wt 168 lb (76.2 kg)   BMI 24.11 kg/m   Intake/Output from previous day: No intake/output data recorded. Intake/Output this shift: @IOTHISSHIFT @   Physical Exam  Lab Results:   Results for orders placed or performed in visit on 02/24/23 (from the past 24 hour(s))  Urinalysis, Routine w  reflex microscopic     Status: None   Collection Time: 02/24/23  8:46 AM  Result Value Ref Range   Specific Gravity, UA 1.025 1.005 - 1.030   pH, UA 6.0 5.0 - 7.5   Color, UA Yellow Yellow   Appearance Ur Clear Clear   Leukocytes,UA Negative Negative   Protein,UA Negative Negative/Trace   Glucose, UA Negative Negative   Ketones, UA Negative Negative   RBC, UA Negative Negative   Bilirubin, UA Negative Negative   Urobilinogen, Ur 0.2 0.2 - 1.0 mg/dL   Nitrite, UA Negative Negative   Microscopic Examination Comment    Narrative   Performed at:  [address] [facility] [address], [place], [place]  [id number] Lab Director: Chinita Pester MT, Phone:  [phone]     Lab Results  Component Value Date   PSA1 1.3 02/17/2023   PSA1 1.2 08/26/2022   PSA1 1.4 07/16/2022   UA is clear.  Studies/Results: No results found. KUB reviewed.  Report is pending.     Assessment/Plan Prostate cancer.   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  He is doing well s/p seeds with a minimal bump in the PSA.  BPH with BOO.  His symptoms are improving.  He will remain on alfuzosin.   Renal stone.   He had a possible 6 mm left renal stone on imaging at his last visit that wasn't present on his CT in 2021.  KUB today suggests a small stone.  He will need a KUB in a year.   Meds ordered this encounter  Medications   alfuzosin (UROXATRAL) 10 MG 24 hr tablet    Sig: Take 1 tablet (10 mg total) by mouth daily with breakfast.    Dispense:  90 tablet    Refill:  3     Orders Placed This Encounter  Procedures   Urinalysis, Routine w reflex microscopic   PSA    Standing Status:   Future    Standing Expiration Date:   02/24/2024     Return in about 6 months (around 08/26/2023) for PSA prior to f/u.Marland Kitchen    CC: Dr. Doylene Canard.      Bjorn Pippin 02/25/2023 [phone]

## 2023-03-03 ENCOUNTER — Ambulatory Visit: Payer: BC Managed Care – PPO | Admitting: Urology

## 2023-03-08 ENCOUNTER — Ambulatory Visit (INDEPENDENT_AMBULATORY_CARE_PROVIDER_SITE_OTHER): Payer: BC Managed Care – PPO | Admitting: Family Medicine

## 2023-03-08 ENCOUNTER — Ambulatory Visit (INDEPENDENT_AMBULATORY_CARE_PROVIDER_SITE_OTHER): Payer: BC Managed Care – PPO

## 2023-03-08 ENCOUNTER — Encounter: Payer: Self-pay | Admitting: Family Medicine

## 2023-03-08 VITALS — BP 139/83 | HR 66 | Temp 97.5°F | Resp 20 | Ht 70.0 in | Wt 175.0 lb

## 2023-03-08 DIAGNOSIS — M25562 Pain in left knee: Secondary | ICD-10-CM | POA: Diagnosis not present

## 2023-03-08 DIAGNOSIS — R030 Elevated blood-pressure reading, without diagnosis of hypertension: Secondary | ICD-10-CM | POA: Diagnosis not present

## 2023-03-08 MED ORDER — PREDNISONE 20 MG PO TABS: 40.0000 mg | ORAL_TABLET | Freq: Every day | ORAL | 0 refills | Status: AC

## 2023-03-08 NOTE — Progress Notes (Signed)
Acute Office Visit  Subjective:  Patient ID: Samuel Powers, male    DOB: 10-Dec-1960, 62 y.o.   MRN: [id number]  Chief Complaint  Patient presents with   Knee Pain   Patient is in today for Left knee pain the last 4-5 days. No trauma. 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 He was walking some last week and noticed it. Noticed mainly with going down stairs and crossing his legs. No pain with sitting still or walking on flat surfaces. Tried ibuprofen 2x per day for the past 5 days, with minimal relief. Denies swelling, redness, fever. Has not tried heat or ice.  Denies history of gout.   ROS As per HPI  Objective:  BP 139/83   Pulse 66   Temp (!) 97.5 F (36.4 C) (Oral)   Resp 20   Ht 5\' 10"  (1.778 m)   Wt 175 lb (79.4 kg)   SpO2 98%   BMI 25.11 kg/m   Physical Exam Constitutional:      General: He is not in acute distress.    Appearance: Normal appearance. He is not ill-appearing, toxic-appearing or diaphoretic.  Cardiovascular:     Rate and Rhythm: Normal rate.     Pulses: Normal pulses.     Heart sounds: Normal heart sounds. No murmur heard.    No gallop.  Pulmonary:     Effort: Pulmonary effort is normal. No respiratory distress.     Breath sounds: Normal breath sounds. No stridor. No wheezing, rhonchi or rales.  Musculoskeletal:     Left knee: Bony tenderness present. No swelling, deformity, effusion, erythema, ecchymosis, lacerations or crepitus. Normal range of motion. Tenderness present.       Legs:     Comments: Localized pain.   Skin:    General: Skin is warm.     Capillary Refill: Capillary refill takes less than 2 seconds.  Neurological:     General: No focal deficit present.     Mental Status: He is alert and oriented to person, place, and time. Mental status is at baseline.     Motor: No weakness.  Psychiatric:        Mood and Affect: Mood normal.        Behavior: Behavior normal.        Thought  Content: Thought content normal.        Judgment: Judgment normal.    Assessment & Plan:  1. Acute pain of left knee Imaging as below. Discussed with patient that he can continue to exercise. Educated patient on using compression sleeve, ibuprofen, tylenol, ice, and heat. Will begin with conservative therapy. Educated patient on red flag symptoms such as fever and erythematous, inflamed joint. Will start with conservative therapy as below. Will continue to monitor. Return if not improve.  - DG Knee 1-2 Views Left - predniSONE (DELTASONE) 20 MG tablet; Take 2 tablets (40 mg total) by mouth daily with breakfast for 5 days.  Dispense: 10 tablet; Refill: 0  2. Elevated blood pressure reading Discussed with patient that BP is slightly elevated. Instructed patient patient to monitor at home and follow up with PCP   The above assessment and management plan was discussed with the patient. The patient verbalized understanding of and has agreed to the management plan using shared-decision making. Patient is aware to call the clinic if they develop any new symptoms or if symptoms fail to improve or worsen. Patient is aware when  to return to the clinic for a follow-up visit. Patient educated on when it is appropriate to go to the emergency department.   Neale Burly, DNP-FNP [facility] [address] [place], [place] [address] [phone]

## 2023-03-10 ENCOUNTER — Encounter: Payer: Self-pay | Admitting: Family Medicine

## 2023-03-23 ENCOUNTER — Encounter: Payer: Self-pay | Admitting: Family Medicine

## 2023-03-23 ENCOUNTER — Ambulatory Visit (INDEPENDENT_AMBULATORY_CARE_PROVIDER_SITE_OTHER): Payer: BC Managed Care – PPO | Admitting: Family Medicine

## 2023-03-23 VITALS — BP 126/72 | HR 70 | Temp 98.3°F | Ht 70.0 in | Wt 176.0 lb

## 2023-03-23 DIAGNOSIS — Z636 Dependent relative needing care at home: Secondary | ICD-10-CM | POA: Diagnosis not present

## 2023-03-23 DIAGNOSIS — F329 Major depressive disorder, single episode, unspecified: Secondary | ICD-10-CM

## 2023-03-23 DIAGNOSIS — F411 Generalized anxiety disorder: Secondary | ICD-10-CM

## 2023-03-23 MED ORDER — MIRTAZAPINE 15 MG PO TABS: 15.0000 mg | ORAL_TABLET | Freq: Every day | ORAL | 3 refills | Status: DC

## 2023-03-23 NOTE — Progress Notes (Unsigned)
Subjective: CC:*** PCP: Raliegh Ip, DO ZOX:WRUEAV T Sebastiano is a 62 y.o. male presenting to clinic today for:  1. ***   ROS: Per HPI  Allergies  Allergen Reactions   Codeine     Weird dreams and light headed   Past Medical History:  Diagnosis Date   Allergy    Anxiety    BLADDER STONE 2021   Depression    Elevated PSA since june 2021   Hyperlipidemia    Prostate cancer (HCC)    Sleep apnea    DID NOT TOLERATE CPAP OR MOUTH GUARD MODERATE OSA SLEEP STUDY 2017   Wears glasses 10/07/2021   OR CONTACTS    Current Outpatient Medications:    alfuzosin (UROXATRAL) 10 MG 24 hr tablet, Take 1 tablet (10 mg total) by mouth daily with breakfast., Disp: 90 tablet, Rfl: 3   cetirizine (ZYRTEC) 10 MG tablet, Take 10 mg by mouth daily., Disp: , Rfl:    doxepin (SINEQUAN) 10 MG capsule, Take 1 capsule (10 mg total) by mouth at bedtime as needed (sleep)., Disp: 90 capsule, Rfl: 3   fluticasone (FLONASE) 50 MCG/ACT nasal spray, Place 1 spray into both nostrils daily., Disp: , Rfl:    montelukast (SINGULAIR) 10 MG tablet, Take 1 tablet (10 mg total) by mouth at bedtime., Disp: 90 tablet, Rfl: 3   rosuvastatin (CRESTOR) 10 MG tablet, Take 1 tablet (10 mg total) by mouth at bedtime., Disp: 90 tablet, Rfl: 3   SF 5000 PLUS 1.1 % CREA dental cream, Take by mouth at bedtime. FLOURIDE CREAM, Disp: , Rfl:    valACYclovir (VALTREX) 1000 MG tablet, Take 1,000 mg by mouth as needed., Disp: , Rfl:  Social History   Socioeconomic History   Marital status: Single    Spouse name: Not on file   Number of children: Not on file   Years of education: Not on file   Highest education level: Not on file  Occupational History   Occupation: IT trainer  Tobacco Use   Smoking status: Never   Smokeless tobacco: Never  Vaping Use   Vaping Use: Never used  Substance and Sexual Activity   Alcohol use: Not Currently   Drug use: Never   Sexual activity: Not on file  Other Topics Concern   Not on file   Social History Narrative   Recently relocated back home from [place] to be closer to his mother, who resides in [place].   Social Determinants of Health   Financial Resource Strain: Not on file  Food Insecurity: Not on file  Transportation Needs: Not on file  Physical Activity: Not on file  Stress: Not on file  Social Connections: Not on file  Intimate Partner Violence: Not on file   Family History  Problem Relation Age of Onset   COPD Mother    Diabetes Mother    Hypertension Mother    Alcohol abuse Father    COPD Brother    Colon cancer Neg Hx    Rectal cancer Neg Hx    Stomach cancer Neg Hx     Objective: Office vital signs reviewed. Ht 5\' 10"  (1.778 m)   Wt 176 lb (79.8 kg)   BMI 25.25 kg/m   Physical Examination:  General: Awake, alert, *** nourished, No acute distress HEENT: Normal    Neck: No masses palpated. No lymphadenopathy    Ears: Tympanic membranes intact, normal light reflex, no erythema, no bulging    Eyes: PERRLA, extraocular membranes intact, sclera ***  Nose: nasal turbinates moist, *** nasal discharge    Throat: moist mucus membranes, no erythema, *** tonsillar exudate.  Airway is patent Cardio: regular rate and rhythm, S1S2 heard, no murmurs appreciated Pulm: clear to auscultation bilaterally, no wheezes, rhonchi or rales; normal work of breathing on room air GI: soft, non-tender, non-distended, bowel sounds present x4, no hepatomegaly, no splenomegaly, no masses GU: external vaginal tissue ***, cervix ***, *** punctate lesions on cervix appreciated, *** discharge from cervical os, *** bleeding, *** cervical motion tenderness, *** abdominal/ adnexal masses Extremities: warm, well perfused, No edema, cyanosis or clubbing; +*** pulses bilaterally MSK: *** gait and *** station Skin: dry; intact; no rashes or lesions Neuro: *** Strength and light touch sensation grossly intact, *** DTRs ***/4  Assessment/ Plan: 62 y.o. male   ***  No  orders of the defined types were placed in this encounter.  No orders of the defined types were placed in this encounter.    Raliegh Ip, DO [facility] [phone]

## 2023-03-23 NOTE — Patient Instructions (Signed)
Give yourself 3 days off the Doxepin and then you can start the Mirtazapine.

## 2023-04-12 ENCOUNTER — Encounter (INDEPENDENT_AMBULATORY_CARE_PROVIDER_SITE_OTHER): Payer: BC Managed Care – PPO | Admitting: Family Medicine

## 2023-04-12 DIAGNOSIS — F411 Generalized anxiety disorder: Secondary | ICD-10-CM

## 2023-04-12 DIAGNOSIS — F329 Major depressive disorder, single episode, unspecified: Secondary | ICD-10-CM | POA: Diagnosis not present

## 2023-04-12 DIAGNOSIS — Z636 Dependent relative needing care at home: Secondary | ICD-10-CM

## 2023-04-12 NOTE — Telephone Encounter (Signed)
Patient and/or legal guardian verbally consented to [facility] services about presenting concerns and psychiatric consultation as appropriate.  The services will be billed as appropriate for the patient.    

## 2023-04-25 MED ORDER — BUSPIRONE HCL 10 MG PO TABS: 10.0000 mg | ORAL_TABLET | Freq: Three times a day (TID) | ORAL | 1 refills | Status: DC

## 2023-04-25 NOTE — Telephone Encounter (Signed)

## 2023-04-25 NOTE — Addendum Note (Signed)
Addended by: Raliegh Ip on: 04/25/2023 09:58 AM   Modules accepted: Orders

## 2023-04-27 DIAGNOSIS — Z85828 Personal history of other malignant neoplasm of skin: Secondary | ICD-10-CM | POA: Diagnosis not present

## 2023-04-27 DIAGNOSIS — D225 Melanocytic nevi of trunk: Secondary | ICD-10-CM | POA: Diagnosis not present

## 2023-04-27 DIAGNOSIS — L814 Other melanin hyperpigmentation: Secondary | ICD-10-CM | POA: Diagnosis not present

## 2023-04-27 DIAGNOSIS — L578 Other skin changes due to chronic exposure to nonionizing radiation: Secondary | ICD-10-CM | POA: Diagnosis not present

## 2023-05-03 ENCOUNTER — Ambulatory Visit (INDEPENDENT_AMBULATORY_CARE_PROVIDER_SITE_OTHER): Payer: BC Managed Care – PPO | Admitting: Professional Counselor

## 2023-05-03 DIAGNOSIS — F411 Generalized anxiety disorder: Secondary | ICD-10-CM

## 2023-05-03 DIAGNOSIS — F329 Major depressive disorder, single episode, unspecified: Secondary | ICD-10-CM | POA: Diagnosis not present

## 2023-05-03 DIAGNOSIS — Z636 Dependent relative needing care at home: Secondary | ICD-10-CM | POA: Diagnosis not present

## 2023-05-03 DIAGNOSIS — F330 Major depressive disorder, recurrent, mild: Secondary | ICD-10-CM

## 2023-05-03 NOTE — BH Specialist Note (Signed)
Collaborative Care Initial Assessment  Session Start time: 9:00 am  Session End time:10:00 am Total time in minutes: 60 min  Type of Contact:  Face to Face Patient consent obtained:  Yes Types of Service: Collaborative care  Summary  Patient is a 62 y.o male being referred for anxiety and depression by his pcp. Patient was engaged and cooperative in session.  Reason for referral in patient/family's own words:  "Stress and anxiety"  Patient's goal for today's visit: "I want to have a life outside of work and caring for my mom"  History of Present illness:   Patient is a 62 y.o. male with a history of anxiety and depression.  Patient reports feeling conflicted a lot as a youth because of his sexuality. Patient reports "it was hard being gay and living in a small town" where he felt it wasn't acceptable. Patient also reports father was an alcoholic which impacted the family unit. Patient reports moving away from home to find more freedom in being himself. Patient reports coming out fully in his 80's and has had a good experience with being openly gay since then. Patient reports not having a true meaningful relationship in almost 10 years. Reports covid was a tough time for him as he isolated a lot and had a lot of fear. Patient was diagnosed with Prostate cancer in 2021 which contributed to his stress. He is in currently in stable condition as treatments have been going well. In 2021 patient moved back to [place], [place] to be closer to his mom. Since he moved back, his anxiety has been increasing. Patient reports worrying a lot about his mom along with driving back and forth to [place], [place] became to much. He decided to move in with his mom to be more involved in her day to day care. 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 He reports aging and not having time for self care contribute to his stress. Patient reports  loss of focus at work, irritability, and some resentment because the sacrifice he had to make to move in with his mom. Patient reports not having much support from his sibling and has to take on most of the responsibility of his mom's care. Patient reports wanting to focus more on himself and enjoy more leisure time.  Social History:  Household: Lives with mother Marital status: Never Married Number of Children: None Employment: IT trainer Education: Masters  Psychiatric Review of systems: Insomnia: Sleep atnea, does not like the cpap. Gets 6-7 hrs a night. Changes in appetite: No Decreased need for sleep: Yes Family history of bipolar disorder: No Hallucinations: No   Paranoia: No    History or current traumatic events (natural disaster, house fire, etc.)? no History or current physical trauma?  no History or current emotional trauma?  no History or current sexual trauma?  no History or current domestic or intimate partner violence?  no PTSD symptoms if any traumatic experiences no  Alcohol and/or Substance Use History   Tobacco Alcohol Other substances  Current use  Abstinent since 2020 Abstinent since 2000  Past use  Patient drank heavily in college. He drank on weekends only. He then moved to moderation. Patient reports a family history of alcoholism so he gave it up in 2020 Patient has a history of polysubstance use in his 66's.   Past treatment       Psychiatric History: Past psychiatry diagnosis: None Patient  currently being seen by therapist/psychiatrist:None Prior Suicide Attempts: No  Past psychiatry Hospitalization(s): None Past history of violence: None  Psychotropic medications: Current medications: Busbar and Mirtazipine Patient taking medications as prescribed:  Yes Side effects reported: No  Current medications (medication list) Current Outpatient Medications on File Prior to Visit  Medication Sig Dispense Refill   alfuzosin (UROXATRAL) 10 MG 24 hr tablet Take  1 tablet (10 mg total) by mouth daily with breakfast. 90 tablet 3   busPIRone (BUSPAR) 10 MG tablet Take 1 tablet (10 mg total) by mouth 3 (three) times daily. For anxiety/ depression 90 tablet 1   cetirizine (ZYRTEC) 10 MG tablet Take 10 mg by mouth daily.     fluticasone (FLONASE) 50 MCG/ACT nasal spray Place 1 spray into both nostrils daily.     mirtazapine (REMERON) 15 MG tablet Take 1 tablet (15 mg total) by mouth at bedtime. 90 tablet 3   montelukast (SINGULAIR) 10 MG tablet Take 1 tablet (10 mg total) by mouth at bedtime. 90 tablet 3   rosuvastatin (CRESTOR) 10 MG tablet Take 1 tablet (10 mg total) by mouth at bedtime. 90 tablet 3   SF 5000 PLUS 1.1 % CREA dental cream Take by mouth at bedtime. FLOURIDE CREAM     valACYclovir (VALTREX) 1000 MG tablet Take 1,000 mg by mouth as needed.     No current facility-administered medications on file prior to visit.     Mental status exam:   General Appearance Luretha Murphy:  Neat Eye Contact:  Good Motor Behavior:  Normal Speech:  Normal Level of Consciousness:  Alert Mood:  Poaitive Affect:  Appropriate Anxiety Level:  Minimal Thought Process:  Coherent Thought Content:  WNL Perception:  Normal Judgment:  Good Insight:  Present   Clinical Assessment   PHQ-9 Assessments:    05/03/2023    9:35 AM 03/23/2023    8:57 AM 03/08/2023    8:07 AM 07/16/2022   10:32 AM 06/11/2022    9:37 AM  Depression screen PHQ 2/9  Decreased Interest 1 1 1  0 0  Down, Depressed, Hopeless 1 2 1 1  0  PHQ - 2 Score 2 3 2 1  0  Altered sleeping 1 2 1 2 1   Tired, decreased energy 1 1 1 1 1   Change in appetite 1 2 1 1  0  Feeling bad or failure about yourself  1 1 0 0 0  Trouble concentrating 1 1 1  0 0  Moving slowly or fidgety/restless 0 1 0 0 0  Suicidal thoughts 0 0 0 0 0  PHQ-9 Score 7 11 6 5 2   Difficult doing work/chores Somewhat difficult Somewhat difficult Somewhat difficult Somewhat difficult Not difficult at all    GAD-7 Assessments:    05/03/2023     9:36 AM 03/23/2023    8:57 AM 03/08/2023    8:10 AM 07/16/2022   10:32 AM  GAD 7 : Generalized Anxiety Score  Nervous, Anxious, on Edge 1 2 1 1   Control/stop worrying 1 2 1 1   Worry too much - different things 1 2 1 1   Trouble relaxing 1 2 1 1   Restless 1 1 1 1   Easily annoyed or irritable 1 1 0 0  Afraid - awful might happen 1 1 0 1  Total GAD 7 Score 7 11 5 6   Anxiety Difficulty   Somewhat difficult Somewhat difficult     Self-harm Behaviors Risk Assessment Self-harm risk factors:  Low Patient endorses recent thoughts of harming self:  Denies [place]  Suicide Severity Rating Scale:   Guns in the home:  None   Protective factors: Family, friends, work  Danger to Others Risk Assessment Danger to others risk factors:  Low Patient endorses recent thoughts of harming others:  Denies  Consulting civil engineer discussed emergency crisis plan with client and provided local emergency services resources.   Diagnosis:   Goals: Increase healthy adjustment to current life circumstances   Interventions: Mindfulness or Relaxation Training and CBT Cognitive Behavioral Therapy   Follow-up Plan: [facility]   [place], [place]

## 2023-05-03 NOTE — Patient Instructions (Addendum)
Begin mindfulness and breathing techniques when activated with anxiety.  Self-Care Plan: Rejoin Al-Anon and/or Adult Child of Alcoholics to rebuild a support network.   Read over healthy boundaries worksheet. https://www.therapistaid.com/worksheets/healthy-boundaries-tips  If your symptoms worsen or you have thoughts of suicide/homicide, PLEASE SEEK IMMEDIATE MEDICAL ATTENTION.  You may always call:   National Suicide Hotline: 988 or (973)177-7622 [place] Crisis Line: (276)153-6611 Crisis Recovery in [place]: (732) 578-1262     These are available 24 hours a day, 7 days a week.

## 2023-05-06 ENCOUNTER — Ambulatory Visit: Payer: BC Managed Care – PPO

## 2023-05-09 ENCOUNTER — Ambulatory Visit (INDEPENDENT_AMBULATORY_CARE_PROVIDER_SITE_OTHER): Payer: BC Managed Care – PPO | Admitting: Professional Counselor

## 2023-05-09 DIAGNOSIS — F330 Major depressive disorder, recurrent, mild: Secondary | ICD-10-CM

## 2023-05-09 NOTE — BH Specialist Note (Signed)
[facility] Follow-up  MRN: [id number] NAME: Samuel Powers Date: 05/09/23  Start time: Start Time: 0930 End time: Stop Time: 1015 Total time: Total Time in Minutes (Visit): 45 Call number: Visit Number: 2- Second Visit  Reason for visit today:  The patient, a 62 year old male, presented for a collaborative care follow-up. Patient reported implementing more self-care into his regimen, including walking more, going to the gym, working with a trainer twice a week, and playing pickleball twice a week. Patient reports feeling an overall improvement in mood. He has been working on detaching, letting go of the need for control, and focusing more on self-care. He reports sleeping well, having a good appetite, and that his current medication regimen is effective. 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 He has good insight into these dynamics and is being intentional about recognizing and addressing them. The session was productive and conducted outside. The patient will continue counseling, moving to biweekly sessions at his request.  PHQ-9 Scores:     05/09/2023   10:26 AM 05/03/2023    9:35 AM 03/23/2023    8:57 AM 03/08/2023    8:07 AM 07/16/2022   10:32 AM  Depression screen PHQ 2/9  Decreased Interest 1 1 1 1  0  Down, Depressed, Hopeless 1 1 2 1 1   PHQ - 2 Score 2 2 3 2 1   Altered sleeping 1 1 2 1 2   Tired, decreased energy 1 1 1 1 1   Change in appetite 1 1 2 1 1   Feeling bad or failure about yourself  1 1 1  0 0  Trouble concentrating 1 1 1 1  0  Moving slowly or fidgety/restless 0 0 1 0 0  Suicidal thoughts 0 0 0 0 0  PHQ-9 Score 7 7 11 6 5   Difficult doing work/chores Somewhat difficult Somewhat difficult Somewhat difficult Somewhat difficult Somewhat difficult   GAD-7 Scores:     05/09/2023   10:27 AM 05/03/2023    9:36 AM 03/23/2023    8:57 AM 03/08/2023    8:10 AM  GAD 7 : Generalized  Anxiety Score  Nervous, Anxious, on Edge 1 1 2 1   Control/stop worrying 1 1 2 1   Worry too much - different things 1 1 2 1   Trouble relaxing 1 1 2 1   Restless 1 1 1 1   Easily annoyed or irritable 1 1 1  0  Afraid - awful might happen 0 1 1 0  Total GAD 7 Score 6 7 11 5   Anxiety Difficulty Somewhat difficult   Somewhat difficult    Stress Current stressors:  Work, caring for his mother Sleep:  Good Appetite:  Good Coping ability:  Good Patient taking medications as prescribed: Yes   Current medications:  Outpatient Encounter Medications as of 05/09/2023  Medication Sig   alfuzosin (UROXATRAL) 10 MG 24 hr tablet Take 1 tablet (10 mg total) by mouth daily with breakfast.   busPIRone (BUSPAR) 10 MG tablet Take 1 tablet (10 mg total) by mouth 3 (three) times daily. For anxiety/ depression   cetirizine (ZYRTEC) 10 MG tablet Take 10 mg by mouth daily.   fluticasone (FLONASE) 50 MCG/ACT nasal spray Place 1 spray into both nostrils daily.   mirtazapine (REMERON) 15 MG tablet Take 1 tablet (15 mg total) by mouth at bedtime.   montelukast (SINGULAIR) 10 MG tablet Take 1 tablet (10 mg total) by mouth at bedtime.   rosuvastatin (CRESTOR) 10 MG  tablet Take 1 tablet (10 mg total) by mouth at bedtime.   SF 5000 PLUS 1.1 % CREA dental cream Take by mouth at bedtime. FLOURIDE CREAM   valACYclovir (VALTREX) 1000 MG tablet Take 1,000 mg by mouth as needed.   No facility-administered encounter medications on file as of 05/09/2023.    Goals, Interventions and Follow-up Plan Goals: Increase healthy adjustment to current life circumstances Interventions: Motivational Interviewing and CBT Cognitive Behavioral Therapy Follow-up Plan: Bi-Weekly counseling  [name]

## 2023-05-09 NOTE — Patient Instructions (Signed)
Continue self-care plan. 

## 2023-05-18 ENCOUNTER — Telehealth (INDEPENDENT_AMBULATORY_CARE_PROVIDER_SITE_OTHER): Payer: BC Managed Care – PPO | Admitting: Professional Counselor

## 2023-05-18 DIAGNOSIS — F330 Major depressive disorder, recurrent, mild: Secondary | ICD-10-CM

## 2023-05-18 NOTE — BH Specialist Note (Signed)
Behavioral Health Treatment Plan Team Note  MRN: [id number] NAME: Samuel Powers  DATE: 05/19/23  Start time: Start Time: 0340 End time: Stop Time: 0355 Total time: Total Time in Minutes (Visit): 15 Documentation Time: 15 min Total collaborative care time: 30 min  Total number of Virtual BH Treatment Team Plan encounters: 1/4  Treatment Team Attendees: Dr. Vanetta Shawl and Esmond Harps  Diagnoses:    ICD-10-CM   1. Mild episode of recurrent major depressive disorder (HCC)  F33.0       Goals, Interventions and Follow-up Plan Goals: Increase healthy adjustment to current life circumstances Interventions: Motivational Interviewing CBT Cognitive Behavioral Therapy Assessment/Provisional Diagnosis Samuel Powers is a 62 y.o. year old male with history of depression, anxiety, prostate cancer s/p brachytherapy. The patient is referred for depression, anxiety.    According to the chart review, he was started on Buspar in June and recently started on mirtazapine    # 1. Mild episode of recurrent major depressive disorder (HCC) There is a worsening in depressive symptoms and anxiety in the context of the following stressors. Monitoring for any side effects from mirtazapine is necessary. If it is effective, consolidation to monotherapy may be considered to avoid polypharmacy. A behavioral health specialist will follow up for CBT, enhancing boundaries with her mother, and promoting self-compassion in the context of being a caregiver to his mother.      Recommendation Continue mirtazapine 15 mg at night Continue buspar 10 mg three times a day for now [facility] specialist to follow up every other week  Follow-up Plan: Bi-weekly Counseling   History of the present illness Presenting Problem/Current Symptoms:  Patient is a 62 y.o. male with a history of anxiety and depression.  Patient reports feeling conflicted a lot as a youth because of his sexuality. Patient reports "it was hard being gay and  living in a small town" where he felt it wasn't acceptable. Patient also reports father was an alcoholic which impacted the family unit. Patient reports moving away from home to find more freedom in being himself. Patient reports coming out fully in his 46's and has had a good experience with being openly gay since then. Patient reports not having a true meaningful relationship in almost 10 years. Reports covid was a tough time for him as he isolated a lot and had a lot of fear. Patient was diagnosed with Prostate cancer in 2021 which contributed to his stress. He is in currently in stable condition as treatments have been going well. In 2021 patient moved back to [place], [place] to be closer to his mom. Since he moved back, his anxiety has been increasing. Patient reports worrying a lot about his mom along with driving back and forth to [place], [place] became to much. He decided to move in with his mom to be more involved in her day to day care. 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 He reports aging and not having time for self care contribute to his stress. Patient reports loss of focus at work, irritability, and some resentment because the sacrifice he had to make to move in with his mom. Patient reports not having much support from his sibling and has to take on most of the responsibility of his mom's care. Patient reports wanting to focus more on himself and enjoy more leisure time.  Screenings PHQ-9 Assessments:     05/09/2023   10:26 AM 05/03/2023  9:35 AM 03/23/2023    8:57 AM  Depression screen PHQ 2/9  Decreased Interest 1 1 1   Down, Depressed, Hopeless 1 1 2   PHQ - 2 Score 2 2 3   Altered sleeping 1 1 2   Tired, decreased energy 1 1 1   Change in appetite 1 1 2   Feeling bad or failure about yourself  1 1 1   Trouble concentrating 1 1 1   Moving slowly or fidgety/restless 0 0 1  Suicidal thoughts 0 0 0  PHQ-9 Score  7 7 11   Difficult doing work/chores Somewhat difficult Somewhat difficult Somewhat difficult   GAD-7 Assessments:     05/09/2023   10:27 AM 05/03/2023    9:36 AM 03/23/2023    8:57 AM 03/08/2023    8:10 AM  GAD 7 : Generalized Anxiety Score  Nervous, Anxious, on Edge 1 1 2 1   Control/stop worrying 1 1 2 1   Worry too much - different things 1 1 2 1   Trouble relaxing 1 1 2 1   Restless 1 1 1 1   Easily annoyed or irritable 1 1 1  0  Afraid - awful might happen 0 1 1 0  Total GAD 7 Score 6 7 11 5   Anxiety Difficulty Somewhat difficult   Somewhat difficult    Past Medical History Past Medical History:  Diagnosis Date   Allergy    Anxiety    BLADDER STONE 2021   Depression    Elevated PSA since june 2021   Hyperlipidemia    Prostate cancer (HCC)    Sleep apnea    DID NOT TOLERATE CPAP OR MOUTH GUARD MODERATE [facility] SLEEP STUDY 2017   Wears glasses 10/07/2021   OR CONTACTS    Vital signs: There were no vitals filed for this visit.  Allergies:  Allergies as of 05/18/2023 - Review Complete 03/23/2023  Allergen Reaction Noted   Codeine  01/01/2020    Medication History Current medications:  Outpatient Encounter Medications as of 05/18/2023  Medication Sig   alfuzosin (UROXATRAL) 10 MG 24 hr tablet Take 1 tablet (10 mg total) by mouth daily with breakfast.   busPIRone (BUSPAR) 10 MG tablet Take 1 tablet (10 mg total) by mouth 3 (three) times daily. For anxiety/ depression   cetirizine (ZYRTEC) 10 MG tablet Take 10 mg by mouth daily.   fluticasone (FLONASE) 50 MCG/ACT nasal spray Place 1 spray into both nostrils daily.   mirtazapine (REMERON) 15 MG tablet Take 1 tablet (15 mg total) by mouth at bedtime.   montelukast (SINGULAIR) 10 MG tablet Take 1 tablet (10 mg total) by mouth at bedtime.   rosuvastatin (CRESTOR) 10 MG tablet Take 1 tablet (10 mg total) by mouth at bedtime.   SF 5000 PLUS 1.1 % CREA dental cream Take by mouth at bedtime. FLOURIDE CREAM   valACYclovir (VALTREX)  1000 MG tablet Take 1,000 mg by mouth as needed.   No facility-administered encounter medications on file as of 05/18/2023.     Scribe for Treatment Team: [name]

## 2023-05-23 ENCOUNTER — Ambulatory Visit (INDEPENDENT_AMBULATORY_CARE_PROVIDER_SITE_OTHER): Payer: BC Managed Care – PPO | Admitting: Professional Counselor

## 2023-05-23 DIAGNOSIS — F330 Major depressive disorder, recurrent, mild: Secondary | ICD-10-CM | POA: Diagnosis not present

## 2023-05-23 NOTE — BH Specialist Note (Addendum)
[facility] Follow-up  MRN: [id number] NAME: RENNER SEBALD Date: 05/23/23  Start time: Start Time: 0900 End time: Stop Time: 0945 Total time: Total Time in Minutes (Visit): 45 Call number: Visit Number: 4- Fourth Visit  Reason for visit today:  The patient is a 62 year old male presenting for a collaborative care follow-up. He appeared well-groomed, engaged, made good eye contact, and was cooperative throughout the session. The patient's chief complaint today was poor sleep last night, which occurs about twice a week. He has difficulty winding down at night or is disrupted during sleep, often getting up to use the bathroom and finding it hard to fall back asleep. He reports that sometimes he just can't shut his brain down. Despite this, the patient reports a steady mood but continues to experience stress as a caretaker for his mother, balancing this responsibility with his own self-care.  The patient has made progress in prioritizing self-care, reporting that he played pickleball three times this week instead of two and went out with a friend for a drink. He is trying to find a balance between caring for his mother and taking time for himself. However, he reports decreased focus at work due to having less to do, which gives him more time to think about other stressors in his life. During the session, the behavioral counselor and patient explored how his upbringing as the child of an alcoholic has made him thrive in chaotic or busy times when he is distracted. The patient showed good insight into this connection.  Other than his sleep issues, the patient expressed a continued effort to enjoy the present moment and be more mindful. The behavioral health counselor reminded him of mindfulness techniques to help him slow down and be more present. His medication regimen is satisfactory, and he reports a good appetite. The patient will follow up in two weeks to report back on his sleep and overall  progress.   PHQ-9 Scores:     05/23/2023    9:07 AM 05/09/2023   10:26 AM 05/03/2023    9:35 AM 03/23/2023    8:57 AM 03/08/2023    8:07 AM  Depression screen PHQ 2/9  Decreased Interest 1 1 1 1 1   Down, Depressed, Hopeless 1 1 1 2 1   PHQ - 2 Score 2 2 2 3 2   Altered sleeping 2 1 1 2 1   Tired, decreased energy 1 1 1 1 1   Change in appetite 0 1 1 2 1   Feeling bad or failure about yourself  1 1 1 1  0  Trouble concentrating 1 1 1 1 1   Moving slowly or fidgety/restless 0 0 0 1 0  Suicidal thoughts 0 0 0 0 0  PHQ-9 Score 7 7 7 11 6   Difficult doing work/chores Somewhat difficult Somewhat difficult Somewhat difficult Somewhat difficult Somewhat difficult   GAD-7 Scores:     05/23/2023    9:11 AM 05/09/2023   10:27 AM 05/03/2023    9:36 AM 03/23/2023    8:57 AM  GAD 7 : Generalized Anxiety Score  Nervous, Anxious, on Edge 1 1 1 2   Control/stop worrying 1 1 1 2   Worry too much - different things 1 1 1 2   Trouble relaxing 1 1 1 2   Restless 1 1 1 1   Easily annoyed or irritable 1 1 1 1   Afraid - awful might happen 1 0 1 1  Total GAD 7 Score 7 6 7 11   Anxiety Difficulty Somewhat difficult Somewhat difficult  Stress Current stressors:  Care taking for his mom Sleep:  Disrupted  Appetite:  Good Coping ability:   Patient taking medications as prescribed:  Yes  Current medications:  Outpatient Encounter Medications as of 05/23/2023  Medication Sig   alfuzosin (UROXATRAL) 10 MG 24 hr tablet Take 1 tablet (10 mg total) by mouth daily with breakfast.   busPIRone (BUSPAR) 10 MG tablet Take 1 tablet (10 mg total) by mouth 3 (three) times daily. For anxiety/ depression   cetirizine (ZYRTEC) 10 MG tablet Take 10 mg by mouth daily.   fluticasone (FLONASE) 50 MCG/ACT nasal spray Place 1 spray into both nostrils daily.   mirtazapine (REMERON) 15 MG tablet Take 1 tablet (15 mg total) by mouth at bedtime.   montelukast (SINGULAIR) 10 MG tablet Take 1 tablet (10 mg total) by mouth at bedtime.    rosuvastatin (CRESTOR) 10 MG tablet Take 1 tablet (10 mg total) by mouth at bedtime.   SF 5000 PLUS 1.1 % CREA dental cream Take by mouth at bedtime. FLOURIDE CREAM   valACYclovir (VALTREX) 1000 MG tablet Take 1,000 mg by mouth as needed.   No facility-administered encounter medications on file as of 05/23/2023.    Follow-up Plan Goals: Self Care Interventions: Mindfulness or Relaxation Training and CBT Cognitive Behavioral Therapy Follow-up Plan:  Bi-weekly counseling. Keep current medication regimen.    Samuel Powers

## 2023-05-23 NOTE — Patient Instructions (Signed)
Continue self-care plan. 

## 2023-06-06 ENCOUNTER — Ambulatory Visit: Payer: BC Managed Care – PPO | Admitting: Professional Counselor

## 2023-06-06 ENCOUNTER — Ambulatory Visit (INDEPENDENT_AMBULATORY_CARE_PROVIDER_SITE_OTHER): Payer: BC Managed Care – PPO | Admitting: Family Medicine

## 2023-06-06 ENCOUNTER — Other Ambulatory Visit: Payer: Self-pay | Admitting: Family Medicine

## 2023-06-06 ENCOUNTER — Ambulatory Visit (INDEPENDENT_AMBULATORY_CARE_PROVIDER_SITE_OTHER): Payer: BC Managed Care – PPO

## 2023-06-06 VITALS — BP 133/76 | HR 73 | Temp 97.9°F | Ht 70.0 in | Wt 179.2 lb

## 2023-06-06 DIAGNOSIS — M25531 Pain in right wrist: Secondary | ICD-10-CM

## 2023-06-06 MED ORDER — NAPROXEN 250 MG PO TABS: 500.0000 mg | ORAL_TABLET | Freq: Two times a day (BID) | ORAL | 0 refills | Status: DC

## 2023-06-06 NOTE — Progress Notes (Signed)
   Acute Office Visit  Subjective:     Patient ID: Samuel Powers, male    DOB: [birth year], 62 y.o.   MRN: [id number]  Chief Complaint  Patient presents with   Wrist Pain    Wrist Pain  The pain is present in the right wrist. This is a new problem. The current episode started in the past 7 days. There has been no history of extremity trauma. The problem occurs intermittently (with rotation of wrist). The problem has been unchanged. The quality of the pain is described as aching. The pain is mild. He has tried acetaminophen for the symptoms.   He has done some light weight lifting and pickle ball recently. He is requesting an Xray today. Denies numbness, tingling, decreased strength, swelling, or erythema.   ROS As per HPI.      Objective:    BP (!) 144/78   Pulse 73   Temp 97.9 F (36.6 C) (Temporal)   Ht 5\' 10"  (1.778 m)   Wt 179 lb 4 oz (81.3 kg)   SpO2 99%   BMI 25.72 kg/m    Physical Exam Vitals and nursing note reviewed.  Constitutional:      General: He is not in acute distress.    Appearance: Normal appearance. He is not ill-appearing, toxic-appearing or diaphoretic.  Musculoskeletal:     Right wrist: Tenderness and bony tenderness present. No swelling, snuff box tenderness or crepitus. Normal range of motion. Normal pulse.     Right hand: Normal.     Comments: Lateral right dorsal wrist tenderness.   Skin:    General: Skin is warm and dry.  Neurological:     General: No focal deficit present.     Mental Status: He is alert and oriented to person, place, and time.  Psychiatric:        Mood and Affect: Mood normal.        Behavior: Behavior normal.     No results found for any visits on 06/06/23.      Assessment & Plan:   Boden was seen today for wrist pain.  Diagnoses and all orders for this visit:  Right wrist pain Discussed likely tendonitis. Xray obtained, repeat pending. Naproxen as below. RICE therapy.  -     naproxen (NAPROSYN) 250 MG  tablet; Take 2 tablets (500 mg total) by mouth 2 (two) times daily with a meal. -     DG Wrist 2 Views Right; Future   Return if symptoms worsen or fail to improve.  The patient indicates understanding of these issues and agrees with the plan.   Gabriel Earing, FNP

## 2023-06-06 NOTE — Patient Instructions (Signed)
Wrist Pain, Adult There are many things that can cause wrist pain. Some common causes include: An injury to the wrist area, such as a sprain, strain, or broken bone (fracture). Overuse of the joint. A condition that causes increased pressure on a nerve in the wrist (carpal tunnel syndrome). Wear and tear of the joints that occurs with aging (osteoarthritis). Other types of joint inflammation and stiffness (arthritis). Sometimes, the cause of wrist pain is not known. Often, the pain goes away when you follow instructions from your health care provider for relieving pain at home. This may include resting the wrist, icing the wrist, or using a splint or an elastic wrap for a short time. If your wrist pain continues, it is important to tell your provider. Follow these instructions at home: If you have a removable splint or elastic wrap: Wear the splint or wrap as told by your provider. Remove it only as told by your provider. Ask your provider if you may remove it for bathing. Check the skin around the splint or wrap every day. Tell your provider about any concerns. Loosen the splint or wrap if your fingers tingle, become numb, or turn cold and blue. Keep the splint or wrap clean. If the splint or wrap is not waterproof: Do not let it get wet. Cover it with a watertight covering when you take a bath or shower. Managing pain, stiffness, and swelling  If told, put ice on the painful area. If you have a removable splint or wrap, remove it as told by your provider. Put ice in a plastic bag. Place a towel between your skin and the bag or between your splint or wrap and the bag. Leave the ice on for 20 minutes, 2-3 times a day. If your skin turns bright red, remove the ice right away to prevent skin damage. The risk of damage is higher if you cannot feel pain, heat, or cold. Move your fingers often to reduce stiffness and swelling. Raise (elevate) the injured area above the level of your heart while  you are sitting or lying down. Activity Rest your affected wrist as told by your provider. Return to your normal activities as told by your provider. Ask your provider what activities are safe for you. Ask your provider when it is safe to drive if you have a splint or wrap on your wrist. Do exercises as told by your provider. General instructions Pay attention to any changes in your symptoms. Take over-the-counter and prescription medicines only as told by your provider. Contact a health care provider if: You have a sudden, sharp pain in the wrist, hand, or arm that is different or new. Any swelling or bruising on your wrist or hand gets worse. Your skin becomes red, has a rash, or has open sores. Your pain does not get better or it gets worse. You have a fever or chills. Get help right away if: You lose feeling in your fingers or hand. Your fingers turn white, very red, or cold and blue. You cannot move your fingers. This information is not intended to replace advice given to you by your health care provider. Make sure you discuss any questions you have with your health care provider. Document Revised: 07/30/2022 Document Reviewed: 07/30/2022 Elsevier Patient Education  2024 [employer].

## 2023-06-10 ENCOUNTER — Encounter: Payer: Self-pay | Admitting: Family Medicine

## 2023-06-13 ENCOUNTER — Telehealth (INDEPENDENT_AMBULATORY_CARE_PROVIDER_SITE_OTHER): Payer: BC Managed Care – PPO | Admitting: Family Medicine

## 2023-06-13 ENCOUNTER — Encounter: Payer: Self-pay | Admitting: Family Medicine

## 2023-06-13 DIAGNOSIS — F329 Major depressive disorder, single episode, unspecified: Secondary | ICD-10-CM

## 2023-06-13 DIAGNOSIS — Z636 Dependent relative needing care at home: Secondary | ICD-10-CM

## 2023-06-13 DIAGNOSIS — F411 Generalized anxiety disorder: Secondary | ICD-10-CM | POA: Diagnosis not present

## 2023-06-13 MED ORDER — DOXEPIN HCL 10 MG PO CAPS: 10.0000 mg | ORAL_CAPSULE | Freq: Every evening | ORAL | Status: DC | PRN

## 2023-06-13 NOTE — Progress Notes (Signed)
MyChart Video visit  Subjective: CC: GAD/ depression PCP: Raliegh Ip, DO OZD:[id number] T Lovett is a 62 y.o. male. Patient provides verbal consent for consult held via video.  Due to COVID-19 pandemic this visit was conducted virtually. This visit type was conducted due to national recommendations for restrictions regarding the COVID-19 Pandemic (e.g. social distancing, sheltering in place) in an effort to limit this patient's exposure and mitigate transmission in our community. All issues noted in this document were discussed and addressed.  A physical exam was not performed with this format.   Location of patient: home Location of provider: [facility] Others present for call: none  1.  Generalized anxiety disorder, depression, insomnia Patient reports that he has gained weight with mirtazapine and he subsequently would like to start weaning off of it.  He is also treated with BuSpar.  Currently taking mirtazapine 15 mg nightly and BuSpar 10 mg twice daily.  He feels like he is getting a lot out of his counseling sessions and is hoping that this will be the primary modality of treatment for anxiety and reactive depression.  He is planning on going back on the doxepin however for sleep and still has several of the 10 mg left at home.  He would like to discuss how to transition down off of BuSpar and mirtazapine today   ROS: Per HPI  Allergies  Allergen Reactions   Codeine     Weird dreams and light headed   Past Medical History:  Diagnosis Date   Allergy    Anxiety    BLADDER STONE 2021   Depression    Elevated PSA since june 2021   Hyperlipidemia    Prostate cancer (HCC)    Sleep apnea    DID NOT TOLERATE CPAP OR MOUTH GUARD MODERATE OSA SLEEP STUDY 2017   Wears glasses 10/07/2021   OR CONTACTS    Current Outpatient Medications:    alfuzosin (UROXATRAL) 10 MG 24 hr tablet, Take 1 tablet (10 mg total) by mouth daily with breakfast., Disp: 90 tablet, Rfl: 3   busPIRone (BUSPAR)  10 MG tablet, Take 1 tablet (10 mg total) by mouth 3 (three) times daily. For anxiety/ depression, Disp: 90 tablet, Rfl: 1   cetirizine (ZYRTEC) 10 MG tablet, Take 10 mg by mouth daily., Disp: , Rfl:    fluticasone (FLONASE) 50 MCG/ACT nasal spray, Place 1 spray into both nostrils daily., Disp: , Rfl:    mirtazapine (REMERON) 15 MG tablet, Take 1 tablet (15 mg total) by mouth at bedtime., Disp: 90 tablet, Rfl: 3   montelukast (SINGULAIR) 10 MG tablet, Take 1 tablet (10 mg total) by mouth at bedtime., Disp: 90 tablet, Rfl: 3   naproxen (NAPROSYN) 250 MG tablet, Take 2 tablets (500 mg total) by mouth 2 (two) times daily with a meal., Disp: 60 tablet, Rfl: 0   rosuvastatin (CRESTOR) 10 MG tablet, Take 1 tablet (10 mg total) by mouth at bedtime., Disp: 90 tablet, Rfl: 3   SF 5000 PLUS 1.1 % CREA dental cream, Take by mouth at bedtime. FLOURIDE CREAM, Disp: , Rfl:    valACYclovir (VALTREX) 1000 MG tablet, Take 1,000 mg by mouth as needed., Disp: , Rfl:   Gen: well appearing male, NAD Psych: Mood stable, speech normal, affect appropriate.  Good eye contact.  Very pleasant, interactive   Assessment/ Plan: 62 y.o. male   Reactive depression  Anxiety reaction  Caregiver stress  May reduce mirtazapine to 7.5 mg nightly for 2 weeks and then  discontinue.  Wean BuSpar to 5 mg twice daily for 4 to 7 days and then 5 mg daily for 4 to 7 days then stop.  Doxepin added back to list.  He still has plenty of this and knows not to start until he is off mirtazapine.  Continue to follow-up with [facility] for counseling sessions.  Encouraged him to contact me should anxiety or depressive symptoms worsen again and we can consider advancing the doxepin dose instead.  Start time: 1:21pm End time: 1:26pm  Total time spent on patient care (including video visit/ documentation): 7 minutes   Hulen Skains, DO [facility] [phone]

## 2023-06-13 NOTE — Telephone Encounter (Signed)
Ok to put on today's DOD for virtual. Please let him know I will call at lunchtime today.

## 2023-07-04 ENCOUNTER — Ambulatory Visit (INDEPENDENT_AMBULATORY_CARE_PROVIDER_SITE_OTHER): Payer: BC Managed Care – PPO | Admitting: Professional Counselor

## 2023-07-04 DIAGNOSIS — F330 Major depressive disorder, recurrent, mild: Secondary | ICD-10-CM | POA: Diagnosis not present

## 2023-07-05 NOTE — BH Specialist Note (Signed)
[place] [facility] Follow-up  MRN: [id number] NAME: Samuel Powers Date: 07/05/23  Start time: Start Time: 1100 End time: Stop Time: 1145 Total time: Total Time in Minutes (Visit): 45 Call number: Visit Number: 5-Fifth Visit  Reason for call today:  The patient is a 62 year old male who returned for a follow-up regarding clavicle care but presented with increased anxiety. He reports that his anxiety has been pretty consistent, largely due to his role as the primary caregiver for his mother, who has recently experienced a decline in her physical health. His mother is suffering from increased knee pain and general discomfort, which has negatively impacted her mood and, in turn, added stress to his own emotional state. The patient expressed feelings of being overwhelmed by his caregiving responsibilities and reflected on his own aging process, expressing a sense of missing out on life. He mentioned that if he were not caring for his mother, he could be enjoying a simpler, more peaceful life, potentially traveling or engaging in other fulfilling activities. Despite feeling burdened by these responsibilities, he also finds purpose in caring for his mother and values being helpful to her. However, he noted that his life currently revolves around work and caregiving, leaving little time for other pursuits.  The patient did mention some upcoming activities that he is looking forward to, such as a beach trip with friends. He also recently attended a tennis tournament, which he enjoyed immensely and described as rejuvenating. This experience sparked feelings of missing out [facility]) as he reflected on the enjoyment of activities that are typical for someone his age. During the session, we discussed his tendencies toward codependency and the impact of his past experiences, including traits commonly seen in adult children of alcoholics, such as the need to control and be helpful. The patient showed good insight into these  behaviors, and we encouraged him to be kinder to himself, recognizing that he is in a challenging period of life.  We discussed the importance of balancing his caregiving role with activities that bring him joy and fulfillment. To help manage his anxiety and stress, we explored the possibility of him delegating some caregiving responsibilities to his brother or considering hiring a home health care aide. The session was productive, and the patient remains stable and functioning relatively well despite his ongoing anxiety and stress related to caregiving. A follow-up appointment is scheduled in two weeks to continue supporting him through this difficult period.  The patient reports weight gain as a side effect of his anxiety medication and has therefore decided to taper off these medications. He is currently managing his anxiety through alternative methods, including regular exercise, mindfulness practices, and counseling.   PHQ-9 Scores:     07/05/2023    2:37 PM 06/06/2023   10:28 AM 05/23/2023    9:07 AM 05/09/2023   10:26 AM 05/03/2023    9:35 AM  Depression screen PHQ 2/9  Decreased Interest 1 1 1 1 1   Down, Depressed, Hopeless 1 1 1 1 1   PHQ - 2 Score 2 2 2 2 2   Altered sleeping 0 1 2 1 1   Tired, decreased energy 1 1 1 1 1   Change in appetite 1 1 0 1 1  Feeling bad or failure about yourself  1 0 1 1 1   Trouble concentrating 1 0 1 1 1   Moving slowly or fidgety/restless 0 0 0 0 0  Suicidal thoughts 0 0 0 0 0  PHQ-9 Score 6 5 7 7  7  Difficult doing work/chores Somewhat difficult Somewhat difficult Somewhat difficult Somewhat difficult Somewhat difficult   GAD-7 Scores:     07/05/2023    2:38 PM 06/06/2023   10:30 AM 05/23/2023    9:11 AM 05/09/2023   10:27 AM  GAD 7 : Generalized Anxiety Score  Nervous, Anxious, on Edge 1 1 1 1   Control/stop worrying 1 1 1 1   Worry too much - different things 1 1 1 1   Trouble relaxing 1 1 1 1   Restless 0 1 1 1   Easily annoyed or irritable 1 0 1 1   Afraid - awful might happen 1 1 1  0  Total GAD 7 Score 6 6 7 6   Anxiety Difficulty Somewhat difficult Somewhat difficult Somewhat difficult Somewhat difficult    Stress Current stressors:  Caretaking for mom Sleep:  Good Appetite:  Good Coping ability:  Good Patient taking medications as prescribed:  yes  Current medications:  Outpatient Encounter Medications as of 07/04/2023  Medication Sig   alfuzosin (UROXATRAL) 10 MG 24 hr tablet Take 1 tablet (10 mg total) by mouth daily with breakfast.   cetirizine (ZYRTEC) 10 MG tablet Take 10 mg by mouth daily.   doxepin (SINEQUAN) 10 MG capsule Take 1 capsule (10 mg total) by mouth at bedtime as needed.   fluticasone (FLONASE) 50 MCG/ACT nasal spray Place 1 spray into both nostrils daily.   montelukast (SINGULAIR) 10 MG tablet Take 1 tablet (10 mg total) by mouth at bedtime.   naproxen (NAPROSYN) 250 MG tablet Take 2 tablets (500 mg total) by mouth 2 (two) times daily with a meal.   rosuvastatin (CRESTOR) 10 MG tablet Take 1 tablet (10 mg total) by mouth at bedtime.   SF 5000 PLUS 1.1 % CREA dental cream Take by mouth at bedtime. FLOURIDE CREAM   valACYclovir (VALTREX) 1000 MG tablet Take 1,000 mg by mouth as needed.   No facility-administered encounter medications on file as of 07/04/2023.     Self-harm Behaviors Risk Assessment Self-harm risk factors:  Low Patient endorses recent thoughts of harming self:  Denies   Danger to Others Risk Assessment Danger to others risk factors:  None Patient endorses recent thoughts of harming others:  Denies   Goals, Interventions and Follow-up Plan Goals:  Patient wants to maintain improved functioning through mindfulness, exercise and ongoing counseling.  Interventions: Mindfulness or Relaxation Training and CBT Cognitive Behavioral Therapy Follow-up Plan:  2 weeks    [name]

## 2023-07-23 ENCOUNTER — Other Ambulatory Visit: Payer: Self-pay | Admitting: Family Medicine

## 2023-07-26 ENCOUNTER — Ambulatory Visit: Payer: BC Managed Care – PPO | Admitting: Professional Counselor

## 2023-07-27 ENCOUNTER — Encounter: Payer: Self-pay | Admitting: Urology

## 2023-07-27 ENCOUNTER — Encounter: Payer: Self-pay | Admitting: Family Medicine

## 2023-07-28 NOTE — Telephone Encounter (Signed)
Please see patient message below and advise

## 2023-08-09 ENCOUNTER — Telehealth: Payer: Self-pay | Admitting: Urology

## 2023-08-09 NOTE — Telephone Encounter (Signed)
Patient wants to know if its ok for him to get his flu shot on Saturday ?  Will it effect his PSA test?

## 2023-08-09 NOTE — Telephone Encounter (Signed)
Patient is made aware, patient has been rescheduled for PSA labs 10/02. Patient voiced understanding "Hard to say but maybe he could go ahead and get the PSA this week to be on the safe side."

## 2023-08-10 ENCOUNTER — Other Ambulatory Visit: Payer: BC Managed Care – PPO

## 2023-08-10 DIAGNOSIS — Z8546 Personal history of malignant neoplasm of prostate: Secondary | ICD-10-CM

## 2023-08-11 LAB — PSA: Prostate Specific Ag, Serum: 0.9 ng/mL (ref 0.0–4.0)

## 2023-08-16 ENCOUNTER — Other Ambulatory Visit: Payer: Self-pay | Admitting: Family Medicine

## 2023-08-16 DIAGNOSIS — E7800 Pure hypercholesterolemia, unspecified: Secondary | ICD-10-CM

## 2023-08-16 DIAGNOSIS — J3089 Other allergic rhinitis: Secondary | ICD-10-CM

## 2023-08-18 ENCOUNTER — Other Ambulatory Visit: Payer: BC Managed Care – PPO

## 2023-08-25 ENCOUNTER — Ambulatory Visit: Payer: BC Managed Care – PPO | Admitting: Urology

## 2023-09-02 ENCOUNTER — Ambulatory Visit: Payer: BC Managed Care – PPO

## 2023-09-07 ENCOUNTER — Ambulatory Visit: Payer: BC Managed Care – PPO | Admitting: Family Medicine

## 2023-09-07 ENCOUNTER — Encounter: Payer: Self-pay | Admitting: Family Medicine

## 2023-09-07 VITALS — BP 115/67 | HR 60 | Temp 98.5°F | Ht 70.0 in | Wt 180.0 lb

## 2023-09-07 DIAGNOSIS — C61 Malignant neoplasm of prostate: Secondary | ICD-10-CM

## 2023-09-07 DIAGNOSIS — Z636 Dependent relative needing care at home: Secondary | ICD-10-CM

## 2023-09-07 DIAGNOSIS — F5101 Primary insomnia: Secondary | ICD-10-CM | POA: Diagnosis not present

## 2023-09-07 DIAGNOSIS — Z0001 Encounter for general adult medical examination with abnormal findings: Secondary | ICD-10-CM

## 2023-09-07 DIAGNOSIS — Z0000 Encounter for general adult medical examination without abnormal findings: Secondary | ICD-10-CM

## 2023-09-07 DIAGNOSIS — G4733 Obstructive sleep apnea (adult) (pediatric): Secondary | ICD-10-CM

## 2023-09-07 DIAGNOSIS — E7800 Pure hypercholesterolemia, unspecified: Secondary | ICD-10-CM | POA: Diagnosis not present

## 2023-09-07 NOTE — Progress Notes (Signed)
Samuel Powers is a 62 y.o. male presents to office today for annual physical exam examination.    Concerns today include: 1.  Caregiver stress Continues to have a lot of caregiver stress.  He notes his mother is very reluctant to allow anybody to come in the home and help.  She seems to be doing a little bit better from health standpoint but continues to struggle with knee issues despite multiple modalities of treatment.  He is a little nervous about not having a good worklife balance with the upcoming tax season.  2.  Obstructive sleep apnea Patient with known obstructive sleep apnea.  This was diagnosed several years ago in [place].  He has [facility] in the past and had a CPAP prescribed to him but notes that he could not tolerate and therefore he has not been compliant with CPAP for many years now.  He is having excessive daytime sedation and will often nod off when he gets home but then cannot sleep at nighttime.  He has been using the doxepin and attempts to help this but again sleep quality has not been fantastic.  He has tried an oral appliance but again this was not really helpful and cause more irritation than it caused improvement in sleep.  Occupation: Airline pilot, Marital status: Single, Substance use: none Health Maintenance Due  Topic Date Due   Zoster Vaccines- Shingrix (2 of 2) 11/05/2022   INFLUENZA VACCINE  06/09/2023   COVID-19 Vaccine (7 - 2023-24 season) 07/10/2023   Refills needed today: All  Immunization History  Administered Date(s) Administered   Influenza,inj,Quad PF,6+ Mos 08/17/2019, 09/09/2020   Influenza-Unspecified 08/22/2021   Moderna SARS-COV2 Booster Vaccination 02/27/2022   Moderna Sars-Covid-2 Vaccination 01/17/2020, 02/16/2020, 09/05/2020, 02/15/2021, 07/18/2021, 07/31/2022   Pneumococcal Polysaccharide-23 04/17/2021   Tdap 04/09/2020   Zoster Recombinant(Shingrix) 09/10/2022   Past Medical History:  Diagnosis Date   Allergy     Anxiety    BLADDER STONE 2021   Depression    Elevated PSA since june 2021   Hyperlipidemia    Prostate cancer (HCC)    Sleep apnea    DID NOT TOLERATE CPAP OR MOUTH GUARD MODERATE [facility] SLEEP STUDY 2017   Wears glasses 10/07/2021   OR CONTACTS   Social History   Socioeconomic History   Marital status: Single    Spouse name: Not on file   Number of children: Not on file   Years of education: Not on file   Highest education level: Bachelor's degree (e.g., BA, AB, BS)  Occupational History   Occupation: IT trainer  Tobacco Use   Smoking status: Never   Smokeless tobacco: Never  Vaping Use   Vaping status: Never Used  Substance and Sexual Activity   Alcohol use: Not Currently   Drug use: Never   Sexual activity: Not on file  Other Topics Concern   Not on file  Social History Narrative   Recently relocated back home from [place] to be closer to his mother, who resides in [place].   Social Determinants of Health   Financial Resource Strain: Low Risk  (06/06/2023)   Overall Financial Resource Strain (CARDIA)    Difficulty of Paying Living Expenses: Not hard at all  Food Insecurity: No Food Insecurity (06/06/2023)   Hunger Vital Sign    Worried About Running Out of Food in the Last Year: Never true    Ran Out of Food in the Last Year: Never true  Transportation Needs: No Transportation Needs (  06/06/2023)   PRAPARE - Administrator, Civil Service (Medical): No    Lack of Transportation (Non-Medical): No  Physical Activity: Sufficiently Active (06/06/2023)   Exercise Vital Sign    Days of Exercise per Week: 4 days    Minutes of Exercise per Session: 60 min  Stress: Stress Concern Present (06/06/2023)   [employer] of Occupational Health - Occupational Stress Questionnaire    Feeling of Stress : To some extent  Social Connections: Socially Isolated (06/06/2023)   Social Connection and Isolation Panel [NHANES]    Frequency of Communication with Friends and  Family: More than three times a week    Frequency of Social Gatherings with Friends and Family: Twice a week    Attends Religious Services: Never    Database administrator or Organizations: No    Attends Engineer, structural: Not on file    Marital Status: Never married  Intimate Partner Violence: Not on file   Past Surgical History:  Procedure Laterality Date   COLONOSCOPY  09/27/2021   CYSTOSCOPY N/A 10/13/2021   Procedure: CYSTOSCOPY FLEXIBLE;  Surgeon: Bjorn Pippin, MD;  Location: [facility];  Service: Urology;  Laterality: N/A;  no seeds found in bladder   CYSTOSCOPY WITH LITHOLAPAXY N/A 08/14/2020   Procedure: CYSTOSCOPY WITH REMOVAL OF BLADDER STONE;  Surgeon: Bjorn Pippin, MD;  Location: [facility];  Service: Urology;  Laterality: N/A;   POLYPECTOMY     PROSTATE BIOPSY N/A 08/14/2020   Procedure: BIOPSY TRANSRECTAL ULTRASONIC PROSTATE (TUBP);  Surgeon: Bjorn Pippin, MD;  Location: [facility];  Service: Urology;  Laterality: N/A;   RADIOACTIVE SEED IMPLANT N/A 10/13/2021   Procedure: RADIOACTIVE SEED IMPLANT/BRACHYTHERAPY IMPLANT;  Surgeon: Bjorn Pippin, MD;  Location[facility];  Service: Urology;  Laterality: N/A;  67   seeds implanted   SPACE OAR INSTILLATION N/A 10/13/2021   Procedure: SPACE OAR INSTILLATION;  Surgeon: Bjorn Pippin, MD;  Location: [facility];  Service: Urology;  Laterality: N/A;   TONSILLECTOMY Bilateral 2000   Family History  Problem Relation Age of Onset   COPD Mother    Diabetes Mother    Hypertension Mother    Alcohol abuse Father    COPD Brother    Colon cancer Neg Hx    Rectal cancer Neg Hx    Stomach cancer Neg Hx     Current Outpatient Medications:    alfuzosin (UROXATRAL) 10 MG 24 hr tablet, Take 1 tablet (10 mg total) by mouth daily with breakfast., Disp: 90 tablet, Rfl: 3   cetirizine (ZYRTEC) 10 MG tablet, Take 10 mg by mouth daily., Disp: , Rfl:    doxepin  (SINEQUAN) 10 MG capsule, TAKE ONE CAPSULE AT BEDTIME AS NEEDED, Disp: 90 capsule, Rfl: 1   fluticasone (FLONASE) 50 MCG/ACT nasal spray, Place 1 spray into both nostrils daily., Disp: , Rfl:    montelukast (SINGULAIR) 10 MG tablet, TAKE 1 TABLET AT BEDTIME, Disp: 90 tablet, Rfl: 1   rosuvastatin (CRESTOR) 10 MG tablet, TAKE 1 TABLET AT BEDTIME, Disp: 90 tablet, Rfl: 0   SF 5000 PLUS 1.1 % CREA dental cream, Take by mouth at bedtime. FLOURIDE CREAM, Disp: , Rfl:    valACYclovir (VALTREX) 1000 MG tablet, Take 1,000 mg by mouth as needed., Disp: , Rfl:   Allergies  Allergen Reactions   Codeine     Weird dreams and light headed     ROS: Review of Systems A comprehensive  review of systems was negative except for: Genitourinary: positive for no incontinence, nocturia x2/nt Behavioral/Psych: positive for anxiety and sleep disturbance    Physical exam BP 115/67   Pulse 60   Temp 98.5 F (36.9 C)   Ht 5\' 10"  (1.778 m)   Wt 180 lb (81.6 kg)   SpO2 98%   BMI 25.83 kg/m  General appearance: alert, cooperative, appears stated age, and no distress Head: Normocephalic, without obvious abnormality, atraumatic Eyes: negative findings: lids and lashes normal, conjunctivae and sclerae normal, corneas clear, and pupils equal, round, reactive to light and accomodation Ears: normal TM's and external ear canals both ears Nose: Nares normal. Septum midline. Mucosa normal. No drainage or sinus tenderness. Throat: lips, mucosa, and tongue normal; teeth and gums normal Neck: no adenopathy, no carotid bruit, supple, symmetrical, trachea midline, and thyroid not enlarged, symmetric, no tenderness/mass/nodules Back: symmetric, no curvature. ROM normal. No CVA tenderness. Lungs: clear to auscultation bilaterally Chest wall: no tenderness Heart: regular rate and rhythm, S1, S2 normal, no murmur, click, rub or gallop Abdomen: soft, non-tender; bowel sounds normal; no masses,  no organomegaly Extremities:  extremities normal, atraumatic, no cyanosis or edema Pulses: 2+ and symmetric Skin: Skin color, texture, turgor normal. No rashes or lesions Lymph nodes: Cervical, supraclavicular, and axillary nodes normal. Neurologic: Grossly normal      09/07/2023    8:07 AM 07/05/2023    2:37 PM 06/06/2023   10:28 AM  Depression screen PHQ 2/9  Decreased Interest 1 1 1   Down, Depressed, Hopeless 1 1 1   PHQ - 2 Score 2 2 2   Altered sleeping 0 0 1  Tired, decreased energy 0 1 1  Change in appetite 0 1 1  Feeling bad or failure about yourself  1 1 0  Trouble concentrating 1 1 0  Moving slowly or fidgety/restless 0 0 0  Suicidal thoughts 0 0 0  PHQ-9 Score 4 6 5   Difficult doing work/chores Somewhat difficult Somewhat difficult Somewhat difficult      09/07/2023    8:07 AM 07/05/2023    2:38 PM 06/06/2023   10:30 AM 05/23/2023    9:11 AM  GAD 7 : Generalized Anxiety Score  Nervous, Anxious, on Edge 1 1 1 1   Control/stop worrying 1 1 1 1   Worry too much - different things 1 1 1 1   Trouble relaxing 1 1 1 1   Restless 0 0 1 1  Easily annoyed or irritable 0 1 0 1  Afraid - awful might happen 0 1 1 1   Total GAD 7 Score 4 6 6 7   Anxiety Difficulty Somewhat difficult Somewhat difficult Somewhat difficult Somewhat difficult     Assessment/ Plan: Milana Na here for annual physical exam.   Annual physical exam  Primary insomnia - Plan: CMP14+EGFR  Pure hypercholesterolemia - Plan: CMP14+EGFR, Lipid Panel  Malignant neoplasm of prostate (HCC) - Plan: CMP14+EGFR, CBC  Caregiver stress  OSA (obstructive sleep apnea) - Plan: Ambulatory referral to Neurology  He is up-to-date on preventative health care.    I am going to work on trying to get some caregiver resources for him.  I have reached out to one of my colleagues for more information as he heavily deals in geriatrics  Fasting labs were collected today.  He will continue statin as prescribed.  No refills were needed at this  time  I reviewed his most recent notes with his urologist.  He is up-to-date on PSA testing.  Sounds to be fairly  asymptomatic from a prostate standpoint with minimal nocturia  Referral to sleep medicine for consideration of repeat polysomnogram.  He has had intolerance to CPAP machine and oral appliance.  I wonder if he might be a good candidate for inspire device.  Suspect an updated sleep study is likely warranted however.  Counseled on healthy lifestyle choices, including diet (rich in fruits, vegetables and lean meats and low in salt and simple carbohydrates) and exercise (at least 30 minutes of moderate physical activity daily).  Patient to follow up 1 yr CPE  Zebulin Siegel M. Nadine Counts, DO

## 2023-09-08 ENCOUNTER — Other Ambulatory Visit: Payer: BC Managed Care – PPO

## 2023-09-08 LAB — CMP14+EGFR
ALT: 17 [IU]/L (ref 0–44)
AST: 21 IU/L (ref 0–40)
Albumin: 4.5 g/dL (ref 3.8–4.9)
Alkaline Phosphatase: 61 [IU]/L (ref 44–121)
BUN/Creatinine Ratio: 17 (ref 10–24)
BUN: 15 mg/dL (ref 8–27)
Bilirubin Total: 0.9 mg/dL (ref 0.0–1.2)
CO2: 24 mmol/L (ref 20–29)
Calcium: 9.3 mg/dL (ref 8.6–10.2)
Chloride: 103 mmol/L (ref 96–106)
Creatinine, Ser: 0.89 mg/dL (ref 0.76–1.27)
Globulin, Total: 1.8 g/dL (ref 1.5–4.5)
Glucose: 88 mg/dL (ref 70–99)
Potassium: 4.6 mmol/L (ref 3.5–5.2)
Sodium: 140 mmol/L (ref 134–144)
Total Protein: 6.3 g/dL (ref 6.0–8.5)
eGFR: 98 mL/min/{1.73_m2} (ref >59)

## 2023-09-08 LAB — CBC
Hematocrit: 41.6 % (ref 37.5–51.0)
Hemoglobin: 13.8 g/dL (ref 13.0–17.7)
MCH: 30.3 pg (ref 26.6–33.0)
MCHC: 33.2 g/dL (ref 31.5–35.7)
MCV: 91 fL (ref 79–97)
Platelets: 265 10*3/uL (ref 150–450)
RBC: 4.56 x10E6/uL (ref 4.14–5.80)
RDW: 13.1 % (ref 11.6–15.4)
WBC: 3.2 10*3/uL — ABNORMAL LOW (ref 3.4–10.8)

## 2023-09-08 LAB — LIPID PANEL
Chol/HDL Ratio: 2.5 ratio (ref 0.0–5.0)
Cholesterol, Total: 157 mg/dL (ref 100–199)
HDL: 63 mg/dL (ref >39)
LDL Chol Calc (NIH): 77 mg/dL (ref 0–99)
Triglycerides: 90 mg/dL (ref 0–149)
VLDL Cholesterol Cal: 17 mg/dL (ref 5–40)

## 2023-09-09 ENCOUNTER — Encounter: Payer: Self-pay | Admitting: Family Medicine

## 2023-09-15 ENCOUNTER — Ambulatory Visit: Payer: BC Managed Care – PPO | Admitting: Urology

## 2023-09-15 ENCOUNTER — Encounter: Payer: Self-pay | Admitting: Urology

## 2023-09-15 VITALS — BP 137/80 | HR 82

## 2023-09-15 DIAGNOSIS — Z8546 Personal history of malignant neoplasm of prostate: Secondary | ICD-10-CM

## 2023-09-15 DIAGNOSIS — N200 Calculus of kidney: Secondary | ICD-10-CM

## 2023-09-15 DIAGNOSIS — R351 Nocturia: Secondary | ICD-10-CM

## 2023-09-15 DIAGNOSIS — N401 Enlarged prostate with lower urinary tract symptoms: Secondary | ICD-10-CM | POA: Diagnosis not present

## 2023-09-15 DIAGNOSIS — N138 Other obstructive and reflux uropathy: Secondary | ICD-10-CM

## 2023-09-15 DIAGNOSIS — Z87442 Personal history of urinary calculi: Secondary | ICD-10-CM

## 2023-09-15 LAB — URINALYSIS, ROUTINE W REFLEX MICROSCOPIC
Bilirubin, UA: NEGATIVE
Glucose, UA: NEGATIVE
Ketones, UA: NEGATIVE
Leukocytes,UA: NEGATIVE
Nitrite, UA: NEGATIVE
Protein,UA: NEGATIVE
RBC, UA: NEGATIVE
Specific Gravity, UA: 1.03 (ref 1.005–1.030)
Urobilinogen, Ur: 0.2 mg/dL (ref 0.2–1.0)
pH, UA: 6 (ref 5.0–7.5)

## 2023-09-15 NOTE — Progress Notes (Signed)
Subjective: 1. BPH with urinary obstruction   2. History of prostate cancer   3. Renal stone   4. Nocturia         Samuel Powers is a 62 yo male who is sent by Dr. Nadine Counts for gross hematuria after a walk with >30 RBC's on a UA on 04/24/20.  His Cr was 0.92 on 04/09/20.  He had it again over 05/11/20.  He has not seen it since.  He has intermittent urgency with some intermittency or straining to void.   He subsequently has had some right flank pain.  His UA today is clear.   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  He has no other GU history or complaints.   07/25/20:  Samuel Powers returns today in f/u.   He continues to have some right flank pain that is more of a discomfort and he has some frequency and urgency with straining to void.  His PSA is 3.8.  He had a CT that showed a 7mm bladder stone.  With bladder wall thickening and trilobar hyperplasia.  His IPSS remains elevated at 29.  09/05/20: Samuel Powers returns today in f/u from recent cystoscopy with bladder stone removal.  He is doing well with improvement in the voiding symptoms.  He has an improved stream with reduced intermittency.  He has had no hematuria or dysuria.   His prostate biopsy was negative.  The prostate volume was 28ml.   He remains on alfuzosin.  His IPSS has declined to 6 from 58.  03/05/21: [name] returns today in f/u for his history of bladder stones and an elevated PSA with negative prostate biopsy on 08/14/20.  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  HIs IPSS is 7 and his UA is clear.  He didn't get the PSA that was ordered for this visit.  05/07/21: Samuel Powers returns today in f/u for his history of an elevated PSA with a negative biopsy in 10/21.  His PSA has continued to rise and was  5.4 on 03/05/21 with a 9.1% f/t ratio and a repeat on 04/17/21 was 6.9.  He had an MRIP on  05/02/21 and it showed a 23ml prostate with a small PIRADS 4 lesion in the right anterior TZ.    06/11/21: Samuel Powers returns today to discuss his recent MR fusion biopsy.   He was found to have a 26ml prostate with Gleason 7(3+4) disease in 2/3 ROI biopsies and Gleason 6 in 1/3 ROI biopsies.   He also had Gleason 7(3+4) in the RMM and RAM biopsies and Gleason 6 in the RML and RAL as well as the LAM biopsies.   His most recent PSA is 6.9.   [facility] 60% OCD, 39% ECE, 4% LNI and 4% SVI.  03/04/22: Samuel Powers returns today in f/u for his history of prostate cancer treated with seeds on 10/13/21.  His PSA is down to 2.8 from 6.9 preop.   He remains on alfuzosin and has moderate LUTS but they are improving.  His IPSS is 18 with nocturia x 2 which has improved since he switched the alfuzosin to qhs.  He has had no hematuria.  He has had no GI issues.  He had a renal US on 02/25/22 that showed a possible 6mm LUP stone.  He has some chronic bladder wall thickening.  His UA is clear. He  has no associated signs or symptoms.     09/02/22: Samuel Powers returns today in f/u.  His PSA continues to decline and was 1.4 in 9/23 and 1.2 this month.  He has had improvement in the LUTS but still has some nocturia x 3 and remains on alfuzosin at bedtime.  His IPSS is 13.  He has had no hematuria.  He has a known LUP stone but no flank pain.     02/24/23: Samuel Powers returns today in f/u.  He is s/p seed implant in 12/22.  His PSA is 1.3 which is minimally changed.  He remains on alfuzosin for BPH with BOO and his IPSS is 11 with nocturia x 2. He has a left renal stone that was last seen on [place] in 11/23 but no flank pain or hematuria.  He has no GI complaints or weight loss.   He has preserved erectile function.   He had a KUB prior to this visit and there is a possible small left mid to upper pole stone.   His UA is clear.   09/15/23: Samuel Powers returns today in f/u. His PSA is down to 0.9.  He has stable LUTS with an IPSS of 10 and nocturia x 2.   He  remains on alfuzosin.  He has had no hematuria or flank pain.  He has had no GI complaints. He has no weight loss.  He has no bone pain.  He has adequate erections.     IPSS     Row Name 09/15/23 1400         International Prostate Symptom Score   How often have you had the sensation of not emptying your bladder? Less than half the time     How often have you had to urinate less than every two hours? Less than 1 in 5 times     How often have you found you stopped and started again several times when you urinated? Less than 1 in 5 times     How often have you found it difficult to postpone urination? Less than half the time     How often have you had a weak urinary stream? Less than half the time     How often have you had to strain to start urination? Not at All     How many times did you typically get up at night to urinate? 2 Times     Total IPSS Score 10       Quality of Life due to urinary symptoms   If you were to spend the rest of your life with your urinary condition just the way it is now how would you feel about that? Mostly Satisfied                ROS:  Review of Systems  All other systems reviewed and are negative.   Allergies  Allergen Reactions   Codeine     Weird dreams and light headed    Past Medical History:  Diagnosis Date   Allergy    Anxiety    BLADDER STONE 2021   Depression    Elevated PSA since june 2021   Hyperlipidemia    Prostate cancer (HCC)    Sleep apnea    DID NOT TOLERATE CPAP OR MOUTH GUARD MODERATE OSA SLEEP STUDY 2017   Wears glasses 10/07/2021   OR CONTACTS    Past Surgical History:  Procedure Laterality Date   COLONOSCOPY  09/27/2021   CYSTOSCOPY N/A  10/13/2021   Procedure: CYSTOSCOPY FLEXIBLE;  Surgeon: Bjorn Pippin, MD;  Location: [facility];  Service: Urology;  Laterality: N/A;  no seeds found in bladder   CYSTOSCOPY WITH LITHOLAPAXY N/A 08/14/2020   Procedure: CYSTOSCOPY WITH REMOVAL OF BLADDER  STONE;  Surgeon: Bjorn Pippin, MD;  Location: [facility];  Service: Urology;  Laterality: N/A;   POLYPECTOMY     PROSTATE BIOPSY N/A 08/14/2020   Procedure: BIOPSY TRANSRECTAL ULTRASONIC PROSTATE (TUBP);  Surgeon: Bjorn Pippin, MD;  Location: [facility];  Service: Urology;  Laterality: N/A;   RADIOACTIVE SEED IMPLANT N/A 10/13/2021   Procedure: RADIOACTIVE SEED IMPLANT/BRACHYTHERAPY IMPLANT;  Surgeon: Bjorn Pippin, MD;  Location: [facility];  Service: Urology;  Laterality: N/A;  67   seeds implanted   SPACE OAR INSTILLATION N/A 10/13/2021   Procedure: SPACE OAR INSTILLATION;  Surgeon: Bjorn Pippin, MD;  Location: [facility];  Service: Urology;  Laterality: N/A;   TONSILLECTOMY Bilateral 2000    Social History   Socioeconomic History   Marital status: Single    Spouse name: Not on file   Number of children: Not on file   Years of education: Not on file   Highest education level: Bachelor's degree (e.g., BA, AB, BS)  Occupational History   Occupation: IT trainer  Tobacco Use   Smoking status: Never   Smokeless tobacco: Never  Vaping Use   Vaping status: Never Used  Substance and Sexual Activity   Alcohol use: Not Currently   Drug use: Never   Sexual activity: Not on file  Other Topics Concern   Not on file  Social History Narrative   Recently relocated back home from [place] to be closer to his mother, who resides in [place].   Social Determinants of Health   Financial Resource Strain: Low Risk  (06/06/2023)   Overall Financial Resource Strain (CARDIA)    Difficulty of Paying Living Expenses: Not hard at all  Food Insecurity: No Food Insecurity (06/06/2023)   Hunger Vital Sign    Worried About Running Out of Food in the Last Year: Never true    Ran Out of Food in the Last Year: Never true  Transportation Needs: No Transportation Needs (06/06/2023)   PRAPARE - Administrator, Civil Service (Medical): No     Lack of Transportation (Non-Medical): No  Physical Activity: Sufficiently Active (06/06/2023)   Exercise Vital Sign    Days of Exercise per Week: 4 days    Minutes of Exercise per Session: 60 min  Stress: Stress Concern Present (06/06/2023)   [employer] of Occupational Health - Occupational Stress Questionnaire    Feeling of Stress : To some extent  Social Connections: Socially Isolated (06/06/2023)   Social Connection and Isolation Panel [NHANES]    Frequency of Communication with Friends and Family: More than three times a week    Frequency of Social Gatherings with Friends and Family: Twice a week    Attends Religious Services: Never    Database administrator or Organizations: No    Attends Engineer, structural: Not on file    Marital Status: Never married  Catering manager Violence: Not on file    Family History  Problem Relation Age of Onset   COPD Mother    Diabetes Mother    Hypertension Mother    Alcohol abuse Father    COPD Brother    Colon cancer Neg Hx    Rectal cancer Neg Hx  Stomach cancer Neg Hx     Anti-infectives: Anti-infectives (From admission, onward)    None       Current Outpatient Medications  Medication Sig Dispense Refill   alfuzosin (UROXATRAL) 10 MG 24 hr tablet Take 1 tablet (10 mg total) by mouth daily with breakfast. 90 tablet 3   cetirizine (ZYRTEC) 10 MG tablet Take 10 mg by mouth daily.     doxepin (SINEQUAN) 10 MG capsule TAKE ONE CAPSULE AT BEDTIME AS NEEDED 90 capsule 1   fluticasone (FLONASE) 50 MCG/ACT nasal spray Place 1 spray into both nostrils daily.     montelukast (SINGULAIR) 10 MG tablet TAKE 1 TABLET AT BEDTIME 90 tablet 1   rosuvastatin (CRESTOR) 10 MG tablet TAKE 1 TABLET AT BEDTIME 90 tablet 0   SF 5000 PLUS 1.1 % CREA dental cream Take by mouth at bedtime. FLOURIDE CREAM     valACYclovir (VALTREX) 1000 MG tablet Take 1,000 mg by mouth as needed.     No current facility-administered medications for this  visit.     Objective: Vital signs in last 24 hours: BP 137/80   Pulse 82   Intake/Output from previous day: No intake/output data recorded. Intake/Output this shift: @IOTHISSHIFT @   Physical Exam  Lab Results:   Results for orders placed or performed in visit on 09/15/23 (from the past 24 hour(s))  Urinalysis, Routine w reflex microscopic     Status: None   Collection Time: 09/15/23  3:04 PM  Result Value Ref Range   Specific Gravity, UA 1.030 1.005 - 1.030   pH, UA 6.0 5.0 - 7.5   Color, UA Yellow Yellow   Appearance Ur Clear Clear   Leukocytes,UA Negative Negative   Protein,UA Negative Negative/Trace   Glucose, UA Negative Negative   Ketones, UA Negative Negative   RBC, UA Negative Negative   Bilirubin, UA Negative Negative   Urobilinogen, Ur 0.2 0.2 - 1.0 mg/dL   Nitrite, UA Negative Negative   Microscopic Examination Comment    Narrative   Performed at:  [address] [facility] [address], [place], [place]  [id number] Lab Director: Chinita Pester MT, Phone:  [phone]      Lab Results  Component Value Date   PSA1 0.9 08/10/2023   PSA1 1.3 02/17/2023   PSA1 1.2 08/26/2022   UA is clear.  Studies/Results: No results found.     Assessment/Plan Prostate cancer.   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  He is doing well s/p seeds with a further decline in the PSA.  BPH with BOO.  His symptoms are stable.  He will remain on alfuzosin.   Renal stone.   He had a possible 6 mm left renal stone on imaging at his last visit that wasn't present on his CT in 2021.Marland Kitchen  He will need a KUB in a 6 months  No orders of the defined types were placed in this encounter.    Orders Placed This Encounter  Procedures   DG Abd 1 View    Standing Status:   Future    Standing Expiration Date:   09/14/2024    Order Specific Question:   Reason for Exam (SYMPTOM  OR DIAGNOSIS REQUIRED)    Answer:   renal stone    Order Specific Question:    Preferred imaging location?    Answer:   [facility]    Order Specific Question:   Radiology Contrast Protocol - do NOT remove file path  Answer:   [email or web address]   Urinalysis, Routine w reflex microscopic   PSA    Standing Status:   Future    Standing Expiration Date:   09/14/2024     Return in about 6 months (around 03/14/2024).    CC: Dr. Doylene Canard.      Bjorn Pippin 09/16/2023 [phone]

## 2023-09-16 ENCOUNTER — Ambulatory Visit: Payer: BC Managed Care – PPO

## 2023-09-21 ENCOUNTER — Other Ambulatory Visit: Payer: Self-pay | Admitting: *Deleted

## 2023-09-21 ENCOUNTER — Other Ambulatory Visit: Payer: BC Managed Care – PPO

## 2023-09-21 DIAGNOSIS — R7989 Other specified abnormal findings of blood chemistry: Secondary | ICD-10-CM | POA: Diagnosis not present

## 2023-09-21 LAB — CBC WITH DIFFERENTIAL/PLATELET
Basophils Absolute: 0 10*3/uL (ref 0.0–0.2)
Basos: 1 %
EOS (ABSOLUTE): 0 10*3/uL (ref 0.0–0.4)
Eos: 1 %
Hematocrit: 42.4 % (ref 37.5–51.0)
Hemoglobin: 14.3 g/dL (ref 13.0–17.7)
Immature Grans (Abs): 0 10*3/uL (ref 0.0–0.1)
Immature Granulocytes: 0 %
Lymphocytes Absolute: 1.5 10*3/uL (ref 0.7–3.1)
Lymphs: 42 %
MCH: 31 pg (ref 26.6–33.0)
MCHC: 33.7 g/dL (ref 31.5–35.7)
MCV: 92 fL (ref 79–97)
Monocytes Absolute: 0.3 10*3/uL (ref 0.1–0.9)
Monocytes: 9 %
Neutrophils Absolute: 1.6 10*3/uL (ref 1.4–7.0)
Neutrophils: 47 %
Platelets: 274 10*3/uL (ref 150–450)
RBC: 4.62 x10E6/uL (ref 4.14–5.80)
RDW: 13 % (ref 11.6–15.4)
WBC: 3.5 10*3/uL (ref 3.4–10.8)

## 2023-09-23 ENCOUNTER — Ambulatory Visit (INDEPENDENT_AMBULATORY_CARE_PROVIDER_SITE_OTHER): Payer: BC Managed Care – PPO

## 2023-09-23 DIAGNOSIS — Z23 Encounter for immunization: Secondary | ICD-10-CM | POA: Diagnosis not present

## 2023-10-04 IMAGING — US US RENAL
1 series · 14 of 25 positions shown · non-contrast
Comparison: CT abdomen pelvis July 11, 2020

CLINICAL DATA: History of nephrolithiasis.

EXAM:
RENAL / URINARY TRACT ULTRASOUND COMPLETE

[Series 1: us renal · 14 of 85 slices shown]
[im 1/85]
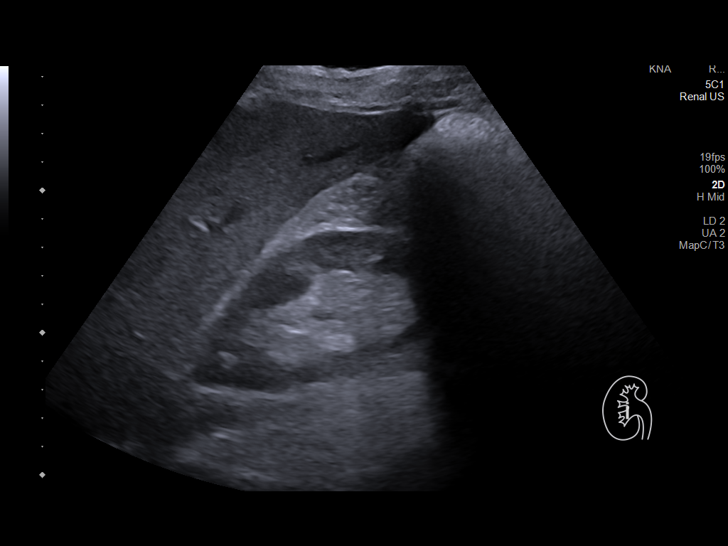
[im 8/85]
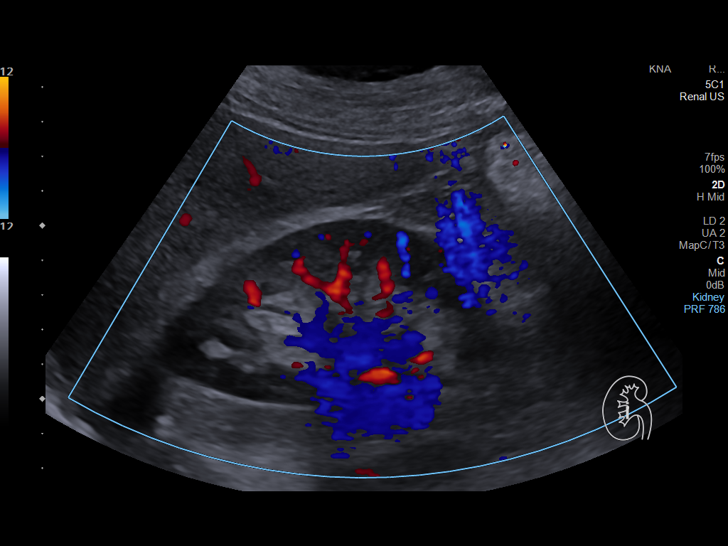
[im 15/85]
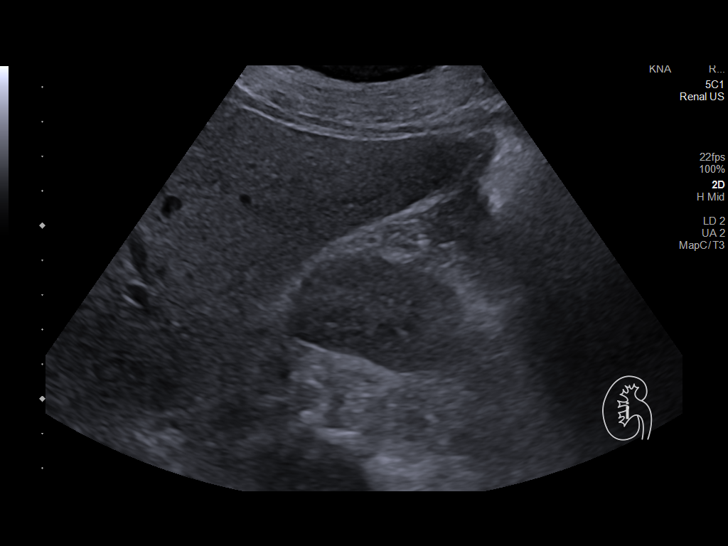
[im 22/85]
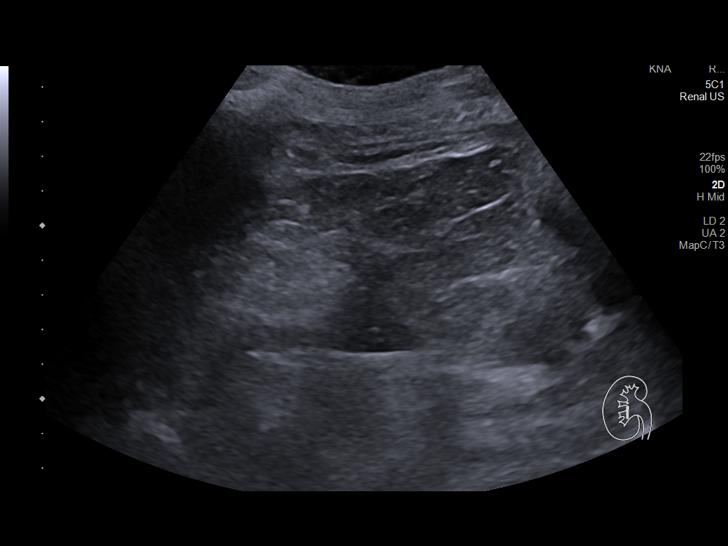
[im 29/85]
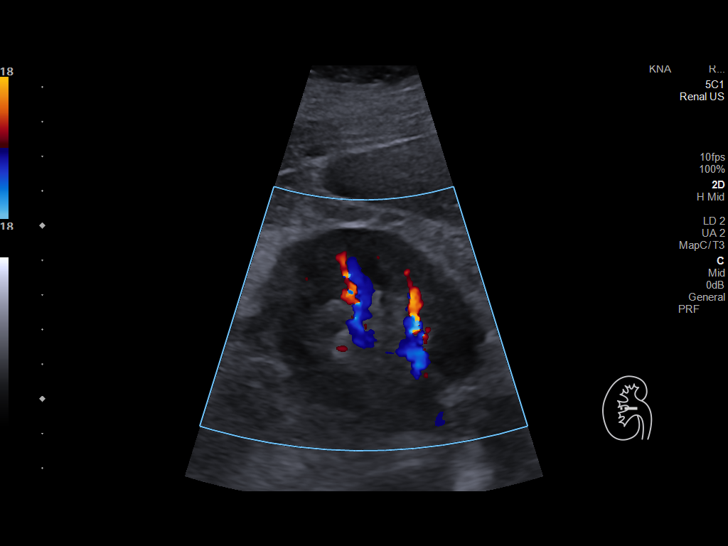
[im 32/85]
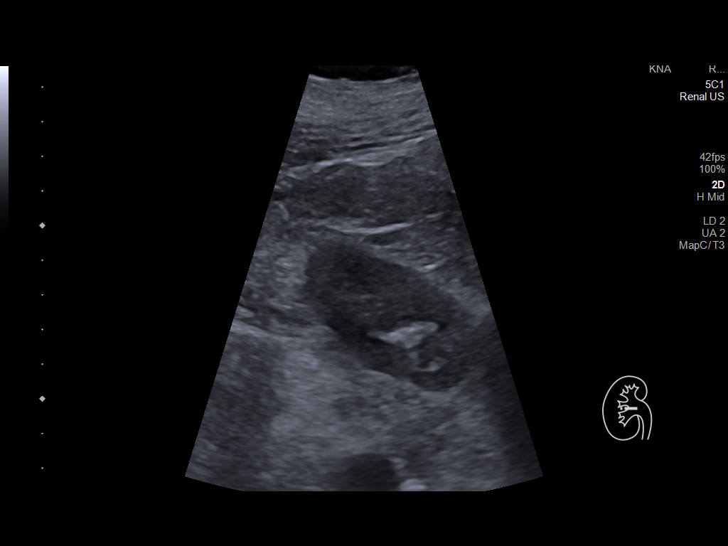
[im 39/85]
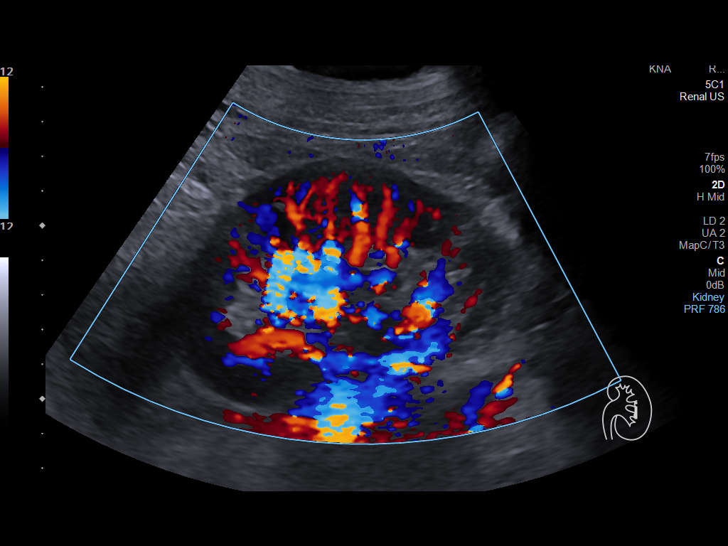
[im 46/85]
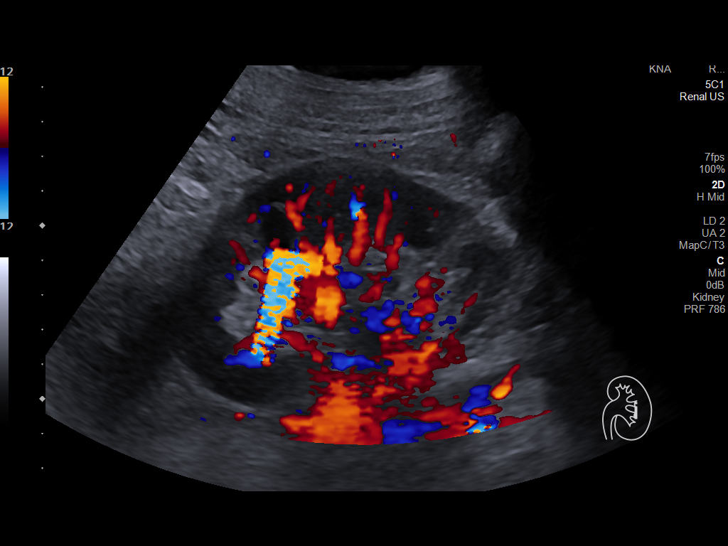
[im 53/85]
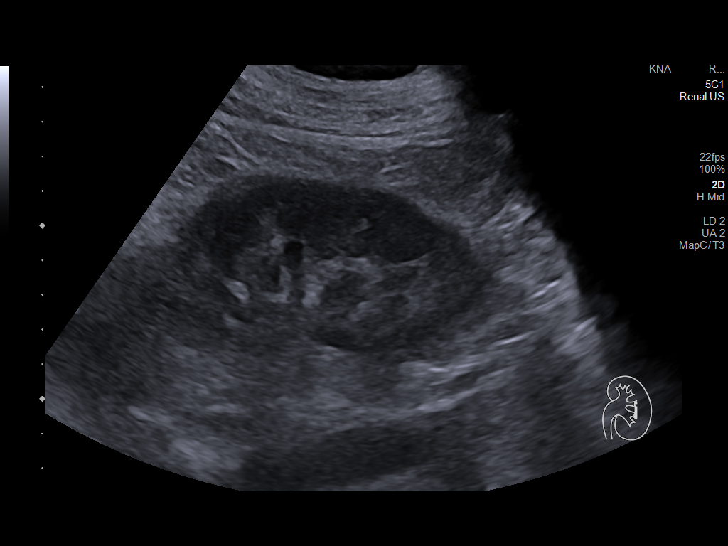
[im 57/85]
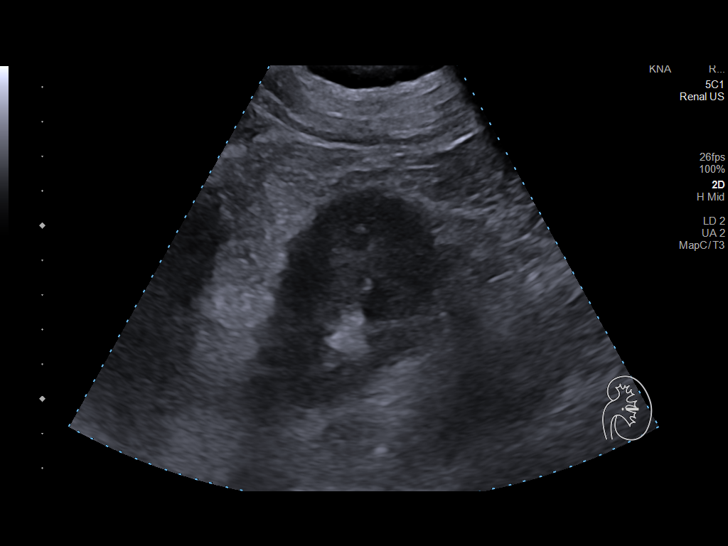
[im 64/85]
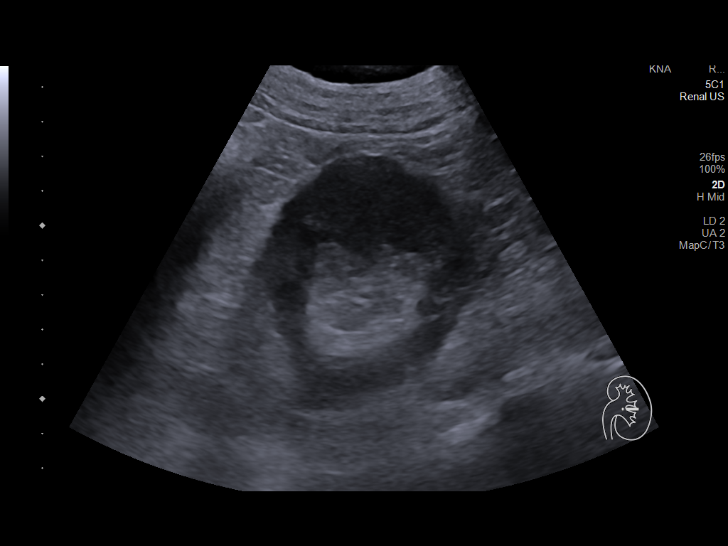
[im 71/85]
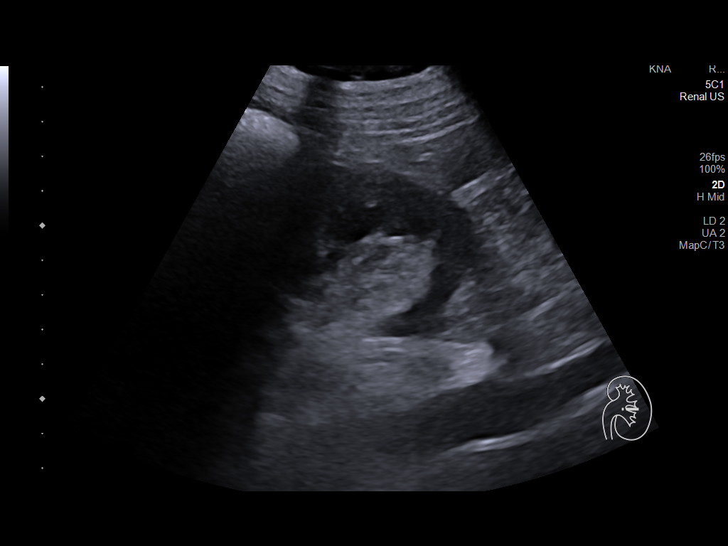
[im 78/85]
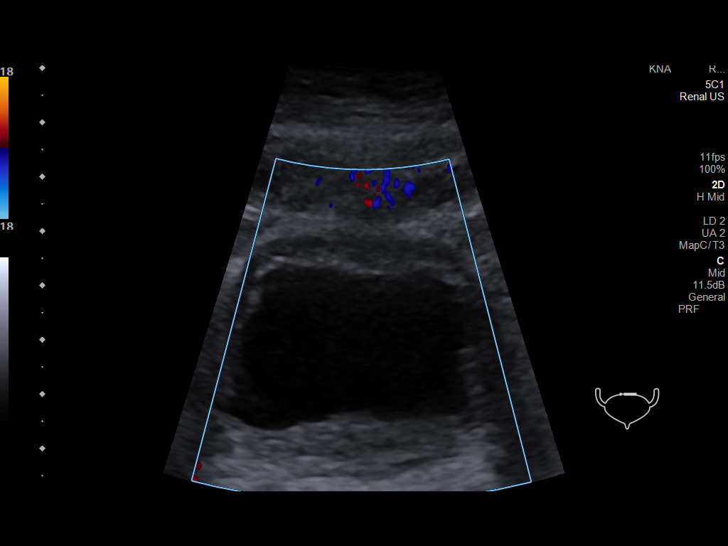
[im 85/85]
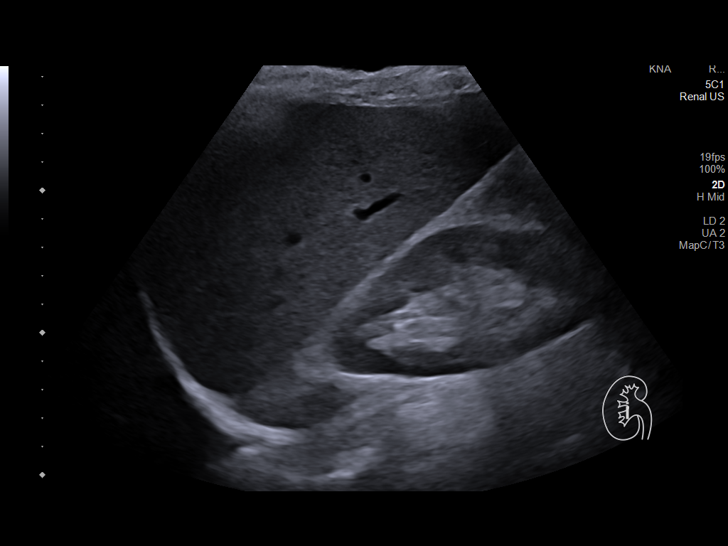

[14 of 25 positions shown; findings below may reference images not displayed]

FINDINGS: Right Kidney:

Renal measurements: 10.7 x 5.4 x 5.7 cm = volume: 174.3 mL.
Echogenicity within normal limits. No mass or hydronephrosis
visualized.

Left Kidney:

Renal measurements: 10.6 x 6.4 x 4.9 cm = volume: 175.5 mL. Normal
renal cortical thickness and echogenicity. No hydronephrosis.
Suggestion of a possible 6 mm stone within the superior pole of the
left kidney.

Bladder:

Predominantly distended with suggestion of circumferential wall
thickening as demonstrated on prior CT.

Other:

None.
IMPRESSION: No hydronephrosis.

Possible 6 mm stone superior pole left kidney.

Persistent circumferential wall thickening of the urinary bladder,
nonspecific.

## 2023-10-17 ENCOUNTER — Ambulatory Visit (INDEPENDENT_AMBULATORY_CARE_PROVIDER_SITE_OTHER): Payer: BC Managed Care – PPO | Admitting: Neurology

## 2023-10-17 ENCOUNTER — Encounter: Payer: Self-pay | Admitting: Neurology

## 2023-10-17 VITALS — BP 144/85 | HR 78 | Ht 70.0 in | Wt 181.0 lb

## 2023-10-17 DIAGNOSIS — E663 Overweight: Secondary | ICD-10-CM

## 2023-10-17 DIAGNOSIS — G4733 Obstructive sleep apnea (adult) (pediatric): Secondary | ICD-10-CM

## 2023-10-17 DIAGNOSIS — R351 Nocturia: Secondary | ICD-10-CM

## 2023-10-17 DIAGNOSIS — G4719 Other hypersomnia: Secondary | ICD-10-CM | POA: Diagnosis not present

## 2023-10-17 NOTE — Patient Instructions (Signed)

## 2023-10-17 NOTE — Progress Notes (Signed)
Subjective:    Patient ID: Samuel Powers is a 62 y.o. male.  HPI    [name], MD, PhD [facility] [address], [address] [address] [place], [place] [address]  Dear Dr. Nadine Counts,  I saw your patient, Samuel Powers, upon your kind request in my sleep clinic today for initial consultation of his sleep disorder, in particular, evaluation of his prior diagnosis of obstructive sleep apnea.  The patient is unaccompanied today.  As you know, Samuel Powers is a 62 year old male with an underlying medical history of allergies, history of bladder stone, anxiety, depression, elevated PSA and history of prostate cancer, hyperlipidemia, and overweight state, who was previously diagnosed with obstructive sleep apnea and placed on PAP therapy.  He could not tolerate PAP therapy at the time particularly with respect to the mask and pressure because of the smell from the zone base cleaning device.  He also had recurrent sinus infections at the time. He would be willing to get reevaluated.  His mom has noticed pauses in his breathing while asleep.   He has nocturia about twice per average night.  He denies recurrent nocturnal morning headaches.  Nocturia improved after you was started on Uroxatral.  Epworth sleepiness score is 9 out of 24, fatigue severity score is 25 out of 63.  Bedtime is around 10:30 PM and rise time around 6:30 AM.  He stopped drinking alcohol about 4 5 years ago.  He reports a family history of alcoholism, dad passed away at 81 from liver cirrhosis.  His mom is 4 years old.  He drinks caffeine in the form of coffee, usually 1 cup in the morning and 2 sodas per day, in cans. He had a tonsillectomy in his early 30s for recurrent tonsillitis. He is a mouth breather and used a fullface mask in the past.  He used a "so clean" machine at the time which made the supplies smell bad, like a chemical smell.  I was able to review his home sleep test results which he brought in  copy for review today.  He had a home sleep test while still living in [place], he moved from [place] to [place] to be closer to mom.  He had a home sleep test through [facility] on 10/16/2016, RDI was 59/h, O2 nadir 80% with mostly mild snoring noted.  He had a CPAP or AutoPap machine for about 8 months as he recalls.  He had tried an oral appliance through a dentist friend but could not tolerate the oral appliance either.  This was probably in 2020.  He does not feel rested.  He does have quite a bit of stress.  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  He does not typically work from home but can in certain circumstances.  He is single, no children, no pets at the house.  He has 1 brother who has COPD.  His Past Medical History Is Significant For: Past Medical History:  Diagnosis Date   Allergy    Anxiety    BLADDER STONE 2021   Depression    Elevated PSA since june 2021   Hyperlipidemia    Prostate cancer (HCC)    Sleep apnea    DID NOT TOLERATE CPAP OR MOUTH GUARD MODERATE OSA SLEEP STUDY 2017   Wears glasses 10/07/2021   OR CONTACTS    His Past Surgical History Is Significant For: Past Surgical History:  Procedure Laterality Date  COLONOSCOPY  09/27/2021   CYSTOSCOPY N/A 10/13/2021   Procedure: CYSTOSCOPY FLEXIBLE;  Surgeon: Bjorn Pippin, MD;  Location: [facility];  Service: Urology;  Laterality: N/A;  no seeds found in bladder   CYSTOSCOPY WITH LITHOLAPAXY N/A 08/14/2020   Procedure: CYSTOSCOPY WITH REMOVAL OF BLADDER STONE;  Surgeon: Bjorn Pippin, MD;  Location: [facility];  Service: Urology;  Laterality: N/A;   POLYPECTOMY     PROSTATE BIOPSY N/A 08/14/2020   Procedure: BIOPSY TRANSRECTAL ULTRASONIC PROSTATE (TUBP);  Surgeon: Bjorn Pippin, MD;  Location: [facility];  Service: Urology;  Laterality: N/A;   RADIOACTIVE SEED IMPLANT N/A 10/13/2021   Procedure: RADIOACTIVE SEED  IMPLANT/BRACHYTHERAPY IMPLANT;  Surgeon: Bjorn Pippin, MD;  Location: [facility];  Service: Urology;  Laterality: N/A;  67   seeds implanted   SPACE OAR INSTILLATION N/A 10/13/2021   Procedure: SPACE OAR INSTILLATION;  Surgeon: Bjorn Pippin, MD;  Location: [facility];  Service: Urology;  Laterality: N/A;   TONSILLECTOMY Bilateral 2000    His Family History Is Significant For: Family History  Problem Relation Age of Onset   COPD Mother    Diabetes Mother    Hypertension Mother    Alcohol abuse Father    COPD Brother    Colon cancer Neg Hx    Rectal cancer Neg Hx    Stomach cancer Neg Hx    Sleep apnea Neg Hx        possibly father but not diagnosed    His Social History Is Significant For: Social History   Socioeconomic History   Marital status: Single    Spouse name: Not on file   Number of children: Not on file   Years of education: Not on file   Highest education level: Bachelor's degree (e.g., BA, AB, BS)  Occupational History   Occupation: IT trainer  Tobacco Use   Smoking status: Never   Smokeless tobacco: Never  Vaping Use   Vaping status: Never Used  Substance and Sexual Activity   Alcohol use: Not Currently   Drug use: Never   Sexual activity: Not on file  Other Topics Concern   Not on file  Social History Narrative   Patient lives with his mother and takes care of her   Right handed   Caffeine: 1 cup coffee and 2 soft drinks daily   Social Determinants of Health   Financial Resource Strain: Low Risk  (06/06/2023)   Overall Financial Resource Strain (CARDIA)    Difficulty of Paying Living Expenses: Not hard at all  Food Insecurity: No Food Insecurity (06/06/2023)   Hunger Vital Sign    Worried About Running Out of Food in the Last Year: Never true    Ran Out of Food in the Last Year: Never true  Transportation Needs: No Transportation Needs (06/06/2023)   PRAPARE - Administrator, Civil Service (Medical): No    Lack of  Transportation (Non-Medical): No  Physical Activity: Sufficiently Active (06/06/2023)   Exercise Vital Sign    Days of Exercise per Week: 4 days    Minutes of Exercise per Session: 60 min  Stress: Stress Concern Present (06/06/2023)   [employer] of Occupational Health - Occupational Stress Questionnaire    Feeling of Stress : To some extent  Social Connections: Socially Isolated (06/06/2023)   Social Connection and Isolation Panel [NHANES]    Frequency of Communication with Friends and Family: More than three times a week  Frequency of Social Gatherings with Friends and Family: Twice a week    Attends Religious Services: Never    Database administrator or Organizations: No    Attends Engineer, structural: Not on file    Marital Status: Never married    His Allergies Are:  Allergies  Allergen Reactions   Codeine     Weird dreams and light headed  :   His Current Medications Are:  Outpatient Encounter Medications as of 10/17/2023  Medication Sig   alfuzosin (UROXATRAL) 10 MG 24 hr tablet Take 1 tablet (10 mg total) by mouth daily with breakfast.   cetirizine (ZYRTEC) 10 MG tablet Take 10 mg by mouth daily.   doxepin (SINEQUAN) 10 MG capsule TAKE ONE CAPSULE AT BEDTIME AS NEEDED   fluticasone (FLONASE) 50 MCG/ACT nasal spray Place 1 spray into both nostrils daily.   montelukast (SINGULAIR) 10 MG tablet TAKE 1 TABLET AT BEDTIME   rosuvastatin (CRESTOR) 10 MG tablet TAKE 1 TABLET AT BEDTIME   SF 5000 PLUS 1.1 % CREA dental cream Take by mouth at bedtime. FLOURIDE CREAM   valACYclovir (VALTREX) 1000 MG tablet Take 1,000 mg by mouth as needed.   No facility-administered encounter medications on file as of 10/17/2023.  :   Review of Systems:  Out of a complete 14 point review of systems, all are reviewed and negative with the exception of these symptoms as listed below:  Review of Systems  Neurological:        Patient is here alone for sleep consultation. He has a  history of OSA. He does not wear cpap. He couldn't get used to it for long term use. He started getting sinus infections and the cleaning device he used (so clean) made it smell like chemicals. He tried an oral appliance but it made his jaw sore. He brought his sleep study results from 2018. He is on Alfuzosin for prostate cancer. He gets up about 2x at night to use the bathroom. ESS 9 FSS 25.    Objective:  Neurological Exam  Physical Exam Physical Examination:   Vitals:   10/17/23 1514  BP: (!) 144/85  Pulse: 78    General Examination: The patient is a very pleasant 62 y.o. male in no acute distress. He appears well-developed and well-nourished and well groomed.   HEENT: Normocephalic, atraumatic, pupils are equal, round and reactive to light, extraocular tracking is good without limitation to gaze excursion or nystagmus noted. Hearing is grossly intact. Face is symmetric with normal facial animation. Speech is clear with no dysarthria noted. There is no hypophonia. There is no lip, neck/head, jaw or voice tremor. Neck is supple with full range of passive and active motion. There are no carotid bruits on auscultation. Oropharynx exam reveals: mild mouth dryness, good dental hygiene and mild airway crowding, due to small airway entry, tonsils absent, Mallampati class I.  Neck circumference 15-1/2 inches, minimal overbite noted.  Tongue protrudes centrally and palate elevates symmetrically.  Chest: Clear to auscultation without wheezing, rhonchi or crackles noted.  Heart: S1+S2+0, regular and normal without murmurs, rubs or gallops noted.   Abdomen: Soft, non-tender and non-distended.  Extremities: There is no pitting edema in the distal lower extremities bilaterally.   Skin: Warm and dry without trophic changes noted.   Musculoskeletal: exam reveals no obvious joint deformities.   Neurologically:  Mental status: The patient is awake, alert and oriented in all 4 spheres. His immediate  and remote memory, attention, language skills  and fund of knowledge are appropriate. There is no evidence of aphasia, agnosia, apraxia or anomia. Speech is clear with normal prosody and enunciation. Thought process is linear. Mood is normal and affect is normal.  Cranial nerves II - XII are as described above under HEENT exam.  Motor exam: Normal bulk, strength and tone is noted. There is no obvious action or resting tremor.  Fine motor skills and coordination: grossly intact.  Cerebellar testing: No dysmetria or intention tremor. There is no truncal or gait ataxia.  Sensory exam: intact to light touch in the upper and lower extremities.  Gait, station and balance: He stands easily. No veering to one side is noted. No leaning to one side is noted. Posture is age-appropriate and stance is narrow based. Gait shows normal stride length and normal pace. No problems turning are noted.   Assessment and Plan:  In summary, Samuel Powers is a very pleasant 62 y.o.-year old male with an underlying medical history of allergies, history of bladder stone, anxiety, depression, elevated PSA and history of prostate cancer, hyperlipidemia, and overweight state, who presents for evaluation of his obstructive sleep apnea.  He had severe obstructive sleep apnea about 7 years ago, by number of respiratory events, oxygen saturations were mildly abnormal only.  He endorses witnessed apneas as observed by his recently.  He does not wake up fully rested and has nocturia.  He would be willing to get reevaluated and consider treatment again.   I had a long chat with the patient about my findings and the diagnosis of sleep apnea, particularly OSA, its prognosis and treatment options. We talked about medical/conservative treatments, surgical interventions and non-pharmacological approaches for symptom control. I explained, in particular, the risks and ramifications of untreated moderate to severe OSA, especially with respect to  developing cardiovascular disease down the road, including congestive heart failure (CHF), difficult to treat hypertension, cardiac arrhythmias (particularly A-fib), neurovascular complications including TIA, stroke and dementia. Even type 2 diabetes has, in part, been linked to untreated OSA. Symptoms of untreated OSA may include (but may not be limited to) daytime sleepiness, nocturia (i.e. frequent nighttime urination), memory problems, mood irritability and suboptimally controlled or worsening mood disorder such as depression and/or anxiety, lack of energy, lack of motivation, physical discomfort, as well as recurrent headaches, especially morning or nocturnal headaches. We talked about the importance of maintaining a healthy lifestyle and striving for healthy weight.  I recommended a sleep study at this time. I outlined the differences between a laboratory attended sleep study which is considered more comprehensive and accurate over the option of a home sleep test (HST); the latter may lead to underestimation of sleep disordered breathing in some instances and does not help with diagnosing upper airway resistance syndrome and is not accurate enough to diagnose primary central sleep apnea typically. I outlined possible surgical and non-surgical treatment options of OSA, including the use of a positive airway pressure (PAP) device (i.e. CPAP, AutoPAP/APAP or BiPAP in certain circumstances), a custom-made dental device (aka oral appliance, which would require a referral to a specialist dentist or orthodontist typically, and is generally speaking not considered for patients with full dentures or edentulous state), upper airway surgical options, such as traditional UPPP (which is not considered a first-line treatment) or the Inspire device (hypoglossal nerve stimulator, which would involve a referral for consultation with an ENT surgeon, after careful selection, following inclusion criteria - also not first-line  treatment). I explained the PAP treatment option to the  patient in detail, as this is generally considered first-line treatment.  The patient indicated that he would be willing to try PAP therapy, if the need arises. I explained the importance of being compliant with PAP treatment, not only for insurance purposes but primarily to improve patient's symptoms symptoms, and for the patient's long term health benefit, including to reduce His cardiovascular risks longer-term.    We will pick up our discussion about the next steps and treatment options after testing.  We will keep him posted as to the test results by phone call and/or MyChart messaging where possible.  We will plan to follow-up in sleep clinic accordingly as well.  I answered all his questions today and the patient was in agreement.   I encouraged him to call with any interim questions, concerns, problems or updates or email [place] through MyChart.  Generally speaking, sleep test authorizations may take up to 2 weeks, sometimes less, sometimes longer, the patient is encouraged to get in touch with [place] if they do not hear back from the sleep lab staff directly within the next 2 weeks.  Thank you very much for allowing me to participate in the care of this nice patient. If I can be of any further assistance to you please do not hesitate to call me at [phone].  Sincerely,   [name], MD, PhD

## 2023-10-27 ENCOUNTER — Encounter: Payer: Self-pay | Admitting: Family Medicine

## 2023-11-10 ENCOUNTER — Other Ambulatory Visit: Payer: Self-pay | Admitting: Family Medicine

## 2023-11-10 DIAGNOSIS — E7800 Pure hypercholesterolemia, unspecified: Secondary | ICD-10-CM

## 2023-11-23 ENCOUNTER — Ambulatory Visit: Payer: BC Managed Care – PPO | Admitting: Neurology

## 2023-11-23 ENCOUNTER — Telehealth: Payer: Self-pay | Admitting: Professional Counselor

## 2023-11-23 DIAGNOSIS — G4733 Obstructive sleep apnea (adult) (pediatric): Secondary | ICD-10-CM

## 2023-11-23 DIAGNOSIS — G4719 Other hypersomnia: Secondary | ICD-10-CM

## 2023-11-23 DIAGNOSIS — R351 Nocturia: Secondary | ICD-10-CM

## 2023-11-23 DIAGNOSIS — E663 Overweight: Secondary | ICD-10-CM

## 2023-11-23 NOTE — Telephone Encounter (Signed)
 Called patient to schedule a follow up with Delrae Field and he said that he did not want to continue with [facility] services at this time but that our counseling had helped him a lot.

## 2023-11-28 ENCOUNTER — Encounter: Payer: Self-pay | Admitting: Neurology

## 2023-11-28 NOTE — Progress Notes (Signed)
 See procedure note.

## 2023-11-28 NOTE — Addendum Note (Signed)
Addended by: [name] on: 11/28/2023 05:37 PM   Modules accepted: Orders

## 2023-11-28 NOTE — Procedures (Signed)
[facility]  HOME SLEEP TEST (Watch PAT) REPORT - Mail out device  STUDY DATE: 11/26/23  DOB: 1961/10/12  MRN: [id number]  ORDERING CLINICIAN: [name], MD, PhD   REFERRING CLINICIAN: Raliegh Ip, DO   CLINICAL INFORMATION/HISTORY: 63 year old male with an underlying medical history of allergies, history of bladder stone, anxiety, depression, elevated PSA and history of prostate cancer, hyperlipidemia, and overweight state, who was previously diagnosed with obstructive sleep apnea and placed on PAP therapy in 2017.  He could not tolerate PAP therapy at the time. He tried an oral appliance and had trouble tolerating it. He presents for re-evaluation.    Epworth sleepiness score: 9/24.  BMI: 25.9 kg/m  FINDINGS:   Sleep Summary:   Total Recording Time (hours, min): 7 hours, 18 min  Total Sleep Time (hours, min):  6 hours, 39 min  Percent REM (%):    30.3%   Respiratory Indices:   Calculated pAHI (per hour):  12.1/hour         REM pAHI:    14.6/hour       NREM pAHI: 11/hour  Central pAHI: 1.2/hour  Oxygen Saturation Statistics:    Oxygen Saturation (%) Mean: 95%   Minimum oxygen saturation (%):                 88%   O2 Saturation Range (%): 88 - 99%    O2 Saturation (minutes) <=88%: 0 min  Pulse Rate Statistics:   Pulse Mean (bpm):    60/min    Pulse Range (46 - 101/min)   IMPRESSION: OSA (obstructive sleep apnea), mild   RECOMMENDATION:  This home sleep test demonstrates overall mild obstructive sleep apnea with a total AHI of 12.1/hour and O2 nadir of 88%. Snoring was detected, and appeared intermittent, in the mild to moderate range, rarely louder. Given the patient's medical history and sleep related complaints, therapy with a positive airway pressure device is recommended. Treatment can be achieved in the form of autoPAP trial/titration at home for now. A full night, in-lab PAP titration study may aid in improving proper  treatment settings and with mask fit, if needed, down the road. Alternative treatments may include weight loss (where appropriate) along with avoidance of the supine sleep position (if possible), or an oral appliance in appropriate candidates.   Please note that untreated obstructive sleep apnea may carry additional perioperative morbidity. Patients with significant obstructive sleep apnea should receive perioperative PAP therapy and the surgeons and particularly the anesthesiologist should be informed of the diagnosis and the severity of the sleep disordered breathing. The patient should be cautioned not to drive, work at heights, or operate dangerous or heavy equipment when tired or sleepy. Review and reiteration of good sleep hygiene measures should be pursued with any patient. Other causes of the patient's symptoms, including circadian rhythm disturbances, an underlying mood disorder, medication effect and/or an underlying medical problem cannot be ruled out based on this test. Clinical correlation is recommended.  The patient and his referring provider will be notified of the test results. The patient will be seen in follow up in sleep clinic at [facility], as necessary.  I certify that I have reviewed the raw data recording prior to the issuance of this report in accordance with the standards of the American Academy of Sleep Medicine (AASM).  INTERPRETING PHYSICIAN:   [name], MD, PhD Medical Director, [facility]) Diplomat, ABPN (Neurology and Sleep)   [facility] [phone]  [address] [place], [place] [address] [phone]

## 2023-11-29 NOTE — Telephone Encounter (Signed)
Informed pt of their sleep study results. The study showed mild OSA . Per Dr. Teofilo Pod recommendations, the pt was advised to start autopap therapy at home. He has verbalized understanding and agreement to proceed with autopap. His questions were answered. We discussed the insurance compliance requirements which includes using the machine at least 4 hours at night and also being seen by our office between 30 and 90 days after setup. The pt scheduled initial f/u for 02/28/24 at 11:30 AM. Discussed DME, will refer to [facility]. Pt verbalized appreciation for the call.   Order sent to [facility]. Report sent to referring provider.   ........................  November 28, 2023 Samuel Foley, MD to [facility] 4 Results    11/28/23  5:37 PM Result Note Patient referred by PCP for re-eval of his OSA, seen by me on 10/17/23, patient had HST on 11/26/23.   Please call and notify the patient that the recent home sleep test showed obstructive sleep apnea. OSA is overall mild, but worth treating to see if he feels better after treatment. To that end I recommend treatment for this in the form of autoPAP, which means, that we don't have to bring him in for a sleep study with CPAP, but will let him try an autoPAP machine at home, through a DME company (of his choice, or as per insurance requirement). The DME representative will educate him on how to use the machine, how to put the mask on, etc. I have placed an order in the chart. Please send referral, talk to patient, send report to referring MD. We will need a FU in sleep clinic in about 2.-3 months post-PAP set up (which is usually an insurance-mandated appointment to monitor compliance), please arrange that with me or one of our NPs. Please also go over the need for compliance with treatment (including the insurance-imposed minimum compliance percentage). Thanks, Samuel Foley, MD, PhD [facility])

## 2023-11-30 NOTE — Telephone Encounter (Signed)
Zott, Linnell Fulling, Otilio Jefferson, RN; [place], [place] Got it Thank you

## 2023-11-30 NOTE — Telephone Encounter (Signed)
Zott, Linnell Fulling, Otilio Jefferson, RN; Zott, [place]; [place], Tammy [place], I apologize I just noticed this patient has a [place] address, we are unable to [employer] for patients with address outside of [place], this will need sent to another provider. Thank You    I sent the order to [facility] instead after confirming with pt that this is ok.

## 2023-11-30 NOTE — Telephone Encounter (Signed)
New, Maryella Shivers, Otilio Jefferson, RN; Alain Honey; Jeris Penta, Luis Llorons Torres; 1 other Received, thank you!

## 2023-12-19 DIAGNOSIS — G4733 Obstructive sleep apnea (adult) (pediatric): Secondary | ICD-10-CM | POA: Diagnosis not present

## 2023-12-29 DIAGNOSIS — G4733 Obstructive sleep apnea (adult) (pediatric): Secondary | ICD-10-CM | POA: Diagnosis not present

## 2024-01-16 DIAGNOSIS — G4733 Obstructive sleep apnea (adult) (pediatric): Secondary | ICD-10-CM | POA: Diagnosis not present

## 2024-01-26 ENCOUNTER — Other Ambulatory Visit: Payer: Self-pay | Admitting: Family

## 2024-02-10 ENCOUNTER — Other Ambulatory Visit: Payer: Self-pay | Admitting: Family Medicine

## 2024-02-10 DIAGNOSIS — E7800 Pure hypercholesterolemia, unspecified: Secondary | ICD-10-CM

## 2024-02-10 DIAGNOSIS — J3089 Other allergic rhinitis: Secondary | ICD-10-CM

## 2024-02-27 NOTE — Progress Notes (Signed)
 Samuel Powers

## 2024-02-28 ENCOUNTER — Ambulatory Visit (INDEPENDENT_AMBULATORY_CARE_PROVIDER_SITE_OTHER): Payer: BC Managed Care – PPO | Admitting: Adult Health

## 2024-02-28 ENCOUNTER — Encounter: Payer: Self-pay | Admitting: Adult Health

## 2024-02-28 VITALS — BP 130/85 | HR 71 | Ht 70.0 in | Wt 184.4 lb

## 2024-02-28 DIAGNOSIS — G4733 Obstructive sleep apnea (adult) (pediatric): Secondary | ICD-10-CM | POA: Diagnosis not present

## 2024-02-28 NOTE — Patient Instructions (Signed)
 Continue using CPAP nightly and greater than 4 hours each night If your symptoms worsen or you develop new symptoms please let us know.

## 2024-02-28 NOTE — Progress Notes (Signed)
 PATIENT: MALON BRANTON DOB: 1960/12/18  REASON FOR VISIT: follow up HISTORY FROM: patient PRIMARY NEUROLOGIST: Dr. Omar Bibber  Chief Complaint  Patient presents with   Room 19    Pt is here Alone. Pt states that things have been alright with his CPAP Machine. ESS 6 FSS 13     HISTORY OF PRESENT ILLNESS: Today 02/28/24:  IMRE VECCHIONE is a 63 y.o. male with a history of OSA on CPAP. Returns today for follow-up.  Reports that his CPAP is working well.  He does notice the benefit of using it.  Does state that it took him a while to get used to it.  His download is below       REVIEW OF SYSTEMS: Out of a complete 14 system review of symptoms, the patient complains only of the following symptoms, and all other reviewed systems are negative.  FSS ESS  ALLERGIES: Allergies  Allergen Reactions   Codeine     Weird dreams and light headed    HOME MEDICATIONS: Outpatient Medications Prior to Visit  Medication Sig Dispense Refill   alfuzosin  (UROXATRAL ) 10 MG 24 hr tablet Take 1 tablet (10 mg total) by mouth daily with breakfast. 90 tablet 3   cetirizine (ZYRTEC) 10 MG tablet Take 10 mg by mouth daily.     doxepin  (SINEQUAN ) 10 MG capsule TAKE ONE CAPSULE AT BEDTIME AS NEEDED 90 capsule 3   fluticasone (FLONASE) 50 MCG/ACT nasal spray Place 1 spray into both nostrils daily.     montelukast  (SINGULAIR ) 10 MG tablet TAKE ONE TABLET AT BEDTIME 90 tablet 0   rosuvastatin  (CRESTOR ) 10 MG tablet TAKE ONE TABLET AT BEDTIME 90 tablet 0   SF 5000 PLUS 1.1 % CREA dental cream Take by mouth at bedtime. FLOURIDE CREAM     valACYclovir (VALTREX) 1000 MG tablet Take 1,000 mg by mouth as needed.     No facility-administered medications prior to visit.    PAST MEDICAL HISTORY: Past Medical History:  Diagnosis Date   Allergy    Anxiety    BLADDER STONE 2021   Depression    Elevated PSA since june 2021   Hyperlipidemia    Prostate cancer (HCC)    Sleep apnea    DID NOT TOLERATE CPAP  OR MOUTH GUARD MODERATE OSA SLEEP STUDY 2017   Wears glasses 10/07/2021   OR CONTACTS    PAST SURGICAL HISTORY: Past Surgical History:  Procedure Laterality Date   COLONOSCOPY  09/27/2021   CYSTOSCOPY N/A 10/13/2021   Procedure: CYSTOSCOPY FLEXIBLE;  Surgeon: Homero Luster, MD;  Location: [facility];  Service: Urology;  Laterality: N/A;  no seeds found in bladder   CYSTOSCOPY WITH LITHOLAPAXY N/A 08/14/2020   Procedure: CYSTOSCOPY WITH REMOVAL OF BLADDER STONE;  Surgeon: Homero Luster, MD;  Location: [facility];  Service: Urology;  Laterality: N/A;   POLYPECTOMY     PROSTATE BIOPSY N/A 08/14/2020   Procedure: BIOPSY TRANSRECTAL ULTRASONIC PROSTATE (TUBP);  Surgeon: Homero Luster, MD;  Location: [facility];  Service: Urology;  Laterality: N/A;   RADIOACTIVE SEED IMPLANT N/A 10/13/2021   Procedure: RADIOACTIVE SEED IMPLANT/BRACHYTHERAPY IMPLANT;  Surgeon: Homero Luster, MD;  Location: [facility];  Service: Urology;  Laterality: N/A;  67   seeds implanted   SPACE OAR INSTILLATION N/A 10/13/2021   Procedure: SPACE OAR INSTILLATION;  Surgeon: Homero Luster, MD;  Location: [facility];  Service: Urology;  Laterality: N/A;   TONSILLECTOMY Bilateral  2000    FAMILY HISTORY: Family History  Problem Relation Age of Onset   COPD Mother    Diabetes Mother    Hypertension Mother    Alcohol abuse Father    COPD Brother    Colon cancer Neg Hx    Rectal cancer Neg Hx    Stomach cancer Neg Hx    Sleep apnea Neg Hx        possibly father but not diagnosed    SOCIAL HISTORY: Social History   Socioeconomic History   Marital status: Single    Spouse name: Not on file   Number of children: Not on file   Years of education: Not on file   Highest education level: Bachelor's degree (e.g., BA, AB, BS)  Occupational History   Occupation: IT trainer  Tobacco Use   Smoking status: Never   Smokeless tobacco: Never  Vaping Use   Vaping  status: Never Used  Substance and Sexual Activity   Alcohol use: Not Currently   Drug use: Never   Sexual activity: Not on file  Other Topics Concern   Not on file  Social History Narrative   Patient lives with his mother and takes care of her   Right handed   Caffeine: 1 cup coffee and 2 soft drinks daily   Social Drivers of Health   Financial Resource Strain: Low Risk  (06/06/2023)   Overall Financial Resource Strain (CARDIA)    Difficulty of Paying Living Expenses: Not hard at all  Food Insecurity: No Food Insecurity (06/06/2023)   Hunger Vital Sign    Worried About Running Out of Food in the Last Year: Never true    Ran Out of Food in the Last Year: Never true  Transportation Needs: No Transportation Needs (06/06/2023)   PRAPARE - Administrator, Civil Service (Medical): No    Lack of Transportation (Non-Medical): No  Physical Activity: Sufficiently Active (06/06/2023)   Exercise Vital Sign    Days of Exercise per Week: 4 days    Minutes of Exercise per Session: 60 min  Stress: Stress Concern Present (06/06/2023)   [employer] of Occupational Health - Occupational Stress Questionnaire    Feeling of Stress : To some extent  Social Connections: Socially Isolated (06/06/2023)   Social Connection and Isolation Panel [NHANES]    Frequency of Communication with Friends and Family: More than three times a week    Frequency of Social Gatherings with Friends and Family: Twice a week    Attends Religious Services: Never    Database administrator or Organizations: No    Attends Engineer, structural: Not on file    Marital Status: Never married  Intimate Partner Violence: Not on file      PHYSICAL EXAM    Generalized: Well developed, in no acute distress  Chest: Lungs clear to auscultation bilaterally  Neurological examination  Mentation: Alert oriented to time, place, history taking. Follows all commands speech and language fluent Cranial nerve  II-XII: Facial symmetry noted  DIAGNOSTIC DATA (LABS, IMAGING, TESTING) - I reviewed patient records, labs, notes, testing and imaging myself where available.  Lab Results  Component Value Date   WBC 3.5 09/21/2023   HGB 14.3 09/21/2023   HCT 42.4 09/21/2023   MCV 92 09/21/2023   PLT 274 09/21/2023      Component Value Date/Time   NA 140 09/07/2023 0846   K 4.6 09/07/2023 0846   CL 103 09/07/2023 0846   CO2 24  09/07/2023 0846   GLUCOSE 88 09/07/2023 0846   GLUCOSE 101 (H) 08/14/2020 0624   BUN 15 09/07/2023 0846   CREATININE 0.89 09/07/2023 0846   CALCIUM  9.3 09/07/2023 0846   PROT 6.3 09/07/2023 0846   ALBUMIN 4.5 09/07/2023 0846   AST 21 09/07/2023 0846   ALT 17 09/07/2023 0846   ALKPHOS 61 09/07/2023 0846   BILITOT 0.9 09/07/2023 0846   GFRNONAA 92 04/09/2020 0937   GFRAA 106 04/09/2020 0937   Lab Results  Component Value Date   CHOL 157 09/07/2023   HDL 63 09/07/2023   LDLCALC 77 09/07/2023   TRIG 90 09/07/2023   CHOLHDL 2.5 09/07/2023   Lab Results  Component Value Date   TSH 2.550 07/16/2022      ASSESSMENT AND PLAN 63 y.o. year old male  has a past medical history of Allergy, Anxiety, BLADDER STONE (2021), Depression, Elevated PSA (since june 2021), Hyperlipidemia, Prostate cancer [facility]), Sleep apnea, and Wears glasses (10/07/2021). here with:  OSA on CPAP  - CPAP compliance excellent - Good treatment of AHI  - Encourage patient to use CPAP nightly and > 4 hours each night - F/U in 1 year or sooner if needed    Clem Currier, MSN, NP-C 02/28/2024, 11:22 AM [facility] [address], [address] [place], [place] [address] [phone]

## 2024-03-05 ENCOUNTER — Encounter: Payer: Self-pay | Admitting: Family Medicine

## 2024-03-05 ENCOUNTER — Ambulatory Visit (INDEPENDENT_AMBULATORY_CARE_PROVIDER_SITE_OTHER): Payer: BC Managed Care – PPO | Admitting: Family Medicine

## 2024-03-05 VITALS — BP 103/60 | HR 69 | Temp 98.6°F | Ht 70.0 in | Wt 183.6 lb

## 2024-03-05 DIAGNOSIS — C61 Malignant neoplasm of prostate: Secondary | ICD-10-CM | POA: Diagnosis not present

## 2024-03-05 DIAGNOSIS — J301 Allergic rhinitis due to pollen: Secondary | ICD-10-CM

## 2024-03-05 DIAGNOSIS — G4733 Obstructive sleep apnea (adult) (pediatric): Secondary | ICD-10-CM | POA: Insufficient documentation

## 2024-03-05 DIAGNOSIS — F5101 Primary insomnia: Secondary | ICD-10-CM | POA: Diagnosis not present

## 2024-03-05 DIAGNOSIS — E7800 Pure hypercholesterolemia, unspecified: Secondary | ICD-10-CM | POA: Diagnosis not present

## 2024-03-05 MED ORDER — ROSUVASTATIN CALCIUM 10 MG PO TABS: 10.0000 mg | ORAL_TABLET | Freq: Every day | ORAL | 4 refills | Status: DC

## 2024-03-05 MED ORDER — MONTELUKAST SODIUM 10 MG PO TABS: 10.0000 mg | ORAL_TABLET | Freq: Every day | ORAL | 4 refills | Status: DC

## 2024-03-05 NOTE — Progress Notes (Signed)
 Subjective: CC: 58-month follow-up PCP: Samuel Guerin, DO [name] is a 63 y.o. male presenting to clinic today for:  1.  Prostate cancer Patient will be seeing Dr. Inga Manges next week.  He has imagings and labs set up at [facility] soon.  He reports good urine output.  No concerns.  He is feeling well  2.  Allergic rhinitis/OSA He has had a little bit of increased nasal congestion which makes his CPAP a little difficult to use at times.  He has been utilizing his Singulair , Flonase and Zyrtec.  Today he is feeling well.  Continuing to use his doxepin  at nighttime with good results.  Had an appointment with his sleep specialist recently.  No changes  3.  Hyperlipidemia Compliant with Crestor .  Needs refills.  No reports of chest pain  ROS: Per HPI  Allergies  Allergen Reactions   Codeine     Weird dreams and light headed   Past Medical History:  Diagnosis Date   Allergy    Anxiety    BLADDER STONE 2021   Depression    Elevated PSA since june 2021   Hyperlipidemia    Prostate cancer (HCC)    Sleep apnea    DID NOT TOLERATE CPAP OR MOUTH GUARD MODERATE OSA SLEEP STUDY 2017   Wears glasses 10/07/2021   OR CONTACTS    Current Outpatient Medications:    alfuzosin  (UROXATRAL ) 10 MG 24 hr tablet, Take 1 tablet (10 mg total) by mouth daily with breakfast., Disp: 90 tablet, Rfl: 3   cetirizine (ZYRTEC) 10 MG tablet, Take 10 mg by mouth daily., Disp: , Rfl:    doxepin  (SINEQUAN ) 10 MG capsule, TAKE ONE CAPSULE AT BEDTIME AS NEEDED, Disp: 90 capsule, Rfl: 3   fluticasone (FLONASE) 50 MCG/ACT nasal spray, Place 1 spray into both nostrils daily., Disp: , Rfl:    SF 5000 PLUS 1.1 % CREA dental cream, Take by mouth at bedtime. FLOURIDE CREAM, Disp: , Rfl:    valACYclovir (VALTREX) 1000 MG tablet, Take 1,000 mg by mouth as needed., Disp: , Rfl:    montelukast  (SINGULAIR ) 10 MG tablet, Take 1 tablet (10 mg total) by mouth at bedtime., Disp: 90 tablet, Rfl: 4    rosuvastatin  (CRESTOR ) 10 MG tablet, Take 1 tablet (10 mg total) by mouth at bedtime., Disp: 90 tablet, Rfl: 4 Social History   Socioeconomic History   Marital status: Single    Spouse name: Not on file   Number of children: Not on file   Years of education: Not on file   Highest education level: Bachelor's degree (e.g., BA, AB, BS)  Occupational History   Occupation: IT trainer  Tobacco Use   Smoking status: Never   Smokeless tobacco: Never  Vaping Use   Vaping status: Never Used  Substance and Sexual Activity   Alcohol use: Not Currently   Drug use: Never   Sexual activity: Not on file  Other Topics Concern   Not on file  Social History Narrative   Patient lives with his mother and takes care of her   Right handed   Caffeine: 1 cup coffee and 2 soft drinks daily   Social Drivers of Health   Financial Resource Strain: Low Risk  (03/01/2024)   Overall Financial Resource Strain (CARDIA)    Difficulty of Paying Living Expenses: Not hard at all  Food Insecurity: No Food Insecurity (03/01/2024)   Hunger Vital Sign    Worried About Running Out of Food in  the Last Year: Never true    Ran Out of Food in the Last Year: Never true  Transportation Needs: No Transportation Needs (03/01/2024)   PRAPARE - Administrator, Civil Service (Medical): No    Lack of Transportation (Non-Medical): No  Physical Activity: Sufficiently Active (03/01/2024)   Exercise Vital Sign    Days of Exercise per Week: 4 days    Minutes of Exercise per Session: 40 min  Stress: Stress Concern Present (03/01/2024)   [employer] of Occupational Health - Occupational Stress Questionnaire    Feeling of Stress : To some extent  Social Connections: Socially Isolated (03/01/2024)   Social Connection and Isolation Panel [NHANES]    Frequency of Communication with Friends and Family: Three times a week    Frequency of Social Gatherings with Friends and Family: Twice a week    Attends Religious Services: Never     Database administrator or Organizations: No    Attends Engineer, structural: Not on file    Marital Status: Never married  Catering manager Violence: Not on file   Family History  Problem Relation Age of Onset   COPD Mother    Diabetes Mother    Hypertension Mother    Alcohol abuse Father    COPD Brother    Colon cancer Neg Hx    Rectal cancer Neg Hx    Stomach cancer Neg Hx    Sleep apnea Neg Hx        possibly father but not diagnosed    Objective: Office vital signs reviewed. BP 103/60   Pulse 69   Temp 98.6 F (37 C)   Ht 5\' 10"  (1.778 m)   Wt 183 lb 9.6 oz (83.3 kg)   SpO2 96%   BMI 26.34 kg/m   Physical Examination:  General: Awake, alert, well nourished, No acute distress HEENT: Normal    Ears: Tympanic membranes intact, normal light reflex, no erythema, no bulging    Eyes: PERRLA, extraocular membranes intact, sclera white    Nose: nasal turbinates moist and mildly erythematous but not edematous, clear nasal discharge    Throat: moist mucus membranes Cardio: regular rate and rhythm, S1S2 heard, no murmurs appreciated Pulm: clear to auscultation bilaterally, no wheezes, rhonchi or rales; normal work of breathing on room air   Assessment/ Plan: 63 y.o. male   Primary insomnia  OSA on CPAP  Malignant neoplasm of prostate (HCC)  Pure hypercholesterolemia - Plan: rosuvastatin  (CRESTOR ) 10 MG tablet  Non-seasonal allergic rhinitis, unspecified trigger - Plan: montelukast  (SINGULAIR ) 10 MG tablet  Insomnia is chronic and stable.  Continue CPAP as prescribed.  Discussed consideration for switching off of Flonase onto Nasacort if he continues to have issues with nasal congestion.  Singulair  renewed  Continue to follow-up with urologist for management of prostate cancer  Not due for fasting lipid until physical in 6 months.  Rosuvastatin  renewed  He will set up pneumococcal vaccination with nurse on a Friday   Samuel Guerin, DO [facility] [phone]

## 2024-03-15 ENCOUNTER — Ambulatory Visit (HOSPITAL_COMMUNITY)
Admission: RE | Admit: 2024-03-15 | Discharge: 2024-03-15 | Disposition: A | Discharge status: Home / Self Care | Source: Ambulatory Visit | Attending: Urology | Admitting: Urology

## 2024-03-15 ENCOUNTER — Other Ambulatory Visit: Payer: BC Managed Care – PPO

## 2024-03-15 DIAGNOSIS — N200 Calculus of kidney: Secondary | ICD-10-CM | POA: Diagnosis not present

## 2024-03-15 DIAGNOSIS — Z8546 Personal history of malignant neoplasm of prostate: Secondary | ICD-10-CM | POA: Diagnosis not present

## 2024-03-15 DIAGNOSIS — N210 Calculus in bladder: Secondary | ICD-10-CM | POA: Diagnosis not present

## 2024-03-16 LAB — PSA: Prostate Specific Ag, Serum: 0.8 ng/mL (ref 0.0–4.0)

## 2024-03-17 DIAGNOSIS — G4733 Obstructive sleep apnea (adult) (pediatric): Secondary | ICD-10-CM | POA: Diagnosis not present

## 2024-03-19 DIAGNOSIS — G4733 Obstructive sleep apnea (adult) (pediatric): Secondary | ICD-10-CM | POA: Diagnosis not present

## 2024-03-22 ENCOUNTER — Encounter: Payer: Self-pay | Admitting: Urology

## 2024-03-22 ENCOUNTER — Ambulatory Visit: Payer: BC Managed Care – PPO | Admitting: Urology

## 2024-03-22 VITALS — BP 135/76 | HR 73

## 2024-03-22 DIAGNOSIS — Z8546 Personal history of malignant neoplasm of prostate: Secondary | ICD-10-CM | POA: Diagnosis not present

## 2024-03-22 DIAGNOSIS — N200 Calculus of kidney: Secondary | ICD-10-CM

## 2024-03-22 DIAGNOSIS — N138 Other obstructive and reflux uropathy: Secondary | ICD-10-CM

## 2024-03-22 DIAGNOSIS — R351 Nocturia: Secondary | ICD-10-CM

## 2024-03-22 DIAGNOSIS — N401 Enlarged prostate with lower urinary tract symptoms: Secondary | ICD-10-CM

## 2024-03-22 LAB — URINALYSIS, ROUTINE W REFLEX MICROSCOPIC
Bilirubin, UA: NEGATIVE
Glucose, UA: NEGATIVE
Ketones, UA: NEGATIVE
Leukocytes,UA: NEGATIVE
Nitrite, UA: NEGATIVE
Protein,UA: NEGATIVE
RBC, UA: NEGATIVE
Specific Gravity, UA: 1.02 (ref 1.005–1.030)
Urobilinogen, Ur: 0.2 mg/dL (ref 0.2–1.0)
pH, UA: 6 (ref 5.0–7.5)

## 2024-03-22 MED ORDER — ALFUZOSIN HCL ER 10 MG PO TB24: 10.0000 mg | ORAL_TABLET | Freq: Every day | ORAL | 3 refills | Status: AC

## 2024-03-22 NOTE — Progress Notes (Unsigned)
 Subjective: 1. History of prostate cancer   2. BPH with urinary obstruction   3. Nocturia   4. Renal stone         Samuel Powers is a 63 yo male who is sent by Dr. Bonnell Butcher for gross hematuria after a walk with >30 RBC's on a UA on 04/24/20.  His Cr was 0.92 on 04/09/20.  He had it again over 05/11/20.  He has not seen it since.  He has intermittent urgency with some intermittency or straining to void.   He subsequently has had some right flank pain.  His UA today is clear.   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  He has no other GU history or complaints.   07/25/20:  Samuel Powers returns today in f/u.   He continues to have some right flank pain that is more of a discomfort and he has some frequency and urgency with straining to void.  His PSA is 3.8.  He had a CT that showed a 7mm bladder stone.  With bladder wall thickening and trilobar hyperplasia.  His IPSS remains elevated at 29.  09/05/20: Samuel Powers returns today in f/u from recent cystoscopy with bladder stone removal.  He is doing well with improvement in the voiding symptoms.  He has an improved stream with reduced intermittency.  He has had no hematuria or dysuria.   His prostate biopsy was negative.  The prostate volume was 28ml.   He remains on alfuzosin .  His IPSS has declined to 6 from 71.  03/05/21: Samuel Powers returns today in f/u for his history of bladder stones and an elevated PSA with negative prostate biopsy on 08/14/20.  He had trilobar hyperplasia with obstruction at cystoscopy but his voiding symptoms declined markedly with the bladder stone remova and alfuzosin .  HIs IPSS is 7 and his UA is clear.  He didn't get the PSA that was ordered for this visit.  05/07/21: Samuel Powers returns today in f/u for his history of an elevated PSA with a negative biopsy in 10/21.  His PSA has continued to rise and was  5.4 on 03/05/21 with a 9.1% f/t ratio and a repeat on 04/17/21 was 6.9.  He had an MRIP on  05/02/21 and it showed a 23ml prostate with a small PIRADS 4 lesion in the right anterior TZ.    06/11/21: Samuel Powers returns today to discuss his recent MR fusion biopsy.   He was found to have a 26ml prostate with Gleason 7(3+4) disease in 2/3 ROI biopsies and Gleason 6 in 1/3 ROI biopsies.   He also had Gleason 7(3+4) in the RMM and RAM biopsies and Gleason 6 in the RML and RAL as well as the LAM biopsies.   His most recent PSA is 6.9.   [facility] 60% OCD, 39% ECE, 4% LNI and 4% SVI.  03/04/22: Samuel Powers returns today in f/u for his history of prostate cancer treated with seeds on 10/13/21.  His PSA is down to 2.8 from 6.9 preop.   He remains on alfuzosin  and has moderate LUTS but they are improving.  His IPSS is 18 with nocturia x 2 which has improved since he switched the alfuzosin  to qhs.  He has had no hematuria.  He has had no GI issues.  He had a renal US  on 02/25/22 that showed a possible 6mm LUP stone.  He has some chronic bladder wall thickening.  His UA is clear. He  has no associated signs or symptoms.     09/02/22: Samuel Powers returns today in f/u.  His PSA continues to decline and was 1.4 in 9/23 and 1.2 this month.  He has had improvement in the LUTS but still has some nocturia x 3 and remains on alfuzosin  at bedtime.  His IPSS is 13.  He has had no hematuria.  He has a known LUP stone but no flank pain.     02/24/23: Samuel Powers returns today in f/u.  He is s/p seed implant in 12/22.  His PSA is 1.3 which is minimally changed.  He remains on alfuzosin  for BPH with BOO and his IPSS is 11 with nocturia x 2. He has a left renal stone that was last seen on US  in 11/23 but no flank pain or hematuria.  He has no GI complaints or weight loss.   He has preserved erectile function.   He had a KUB prior to this visit and there is a possible small left mid to upper pole stone.   His UA is clear.   09/15/23: Samuel Powers returns today in f/u. His PSA is down to 0.9.  He has stable LUTS with an IPSS of 10 and nocturia x 2.   He  remains on alfuzosin .  He has had no hematuria or flank pain.  He has had no GI complaints. He has no weight loss.  He has no bone pain.  He has adequate erections.   03/22/24: Samuel Powers returns today in f/u. His PSA is down further to 0.8 following a seed implant in 12/22.  He has improved LUTS with an IPSS of 6 and nocturia x 1.  He remains on alfuzosin . His stream is better but he has some intermittency.  He has no hematuria.  He has had no GI complaints.  He has gained some weight.  He has no bone pain.  He has good erectile function.   He has a history of a renal stone but it is no evident on a KUB from 03/15/24.   UA is clear.     IPSS     Row Name 03/22/24 1100         International Prostate Symptom Score   How often have you had the sensation of not emptying your bladder? Less than 1 in 5     How often have you had to urinate less than every two hours? Less than 1 in 5 times     How often have you found you stopped and started again several times when you urinated? Less than 1 in 5 times     How often have you found it difficult to postpone urination? Less than 1 in 5 times     How often have you had a weak urinary stream? Less than 1 in 5 times     How often have you had to strain to start urination? Not at All     How many times did you typically get up at night to urinate? 1 Time     Total IPSS Score 6       Quality of Life due to urinary symptoms   If you were to spend the rest of your life with your urinary condition just the way it is now how would you feel about that? Pleased                 ROS:  Review of Systems  Psychiatric/Behavioral:  The patient is nervous/anxious.   All  other systems reviewed and are negative.   Allergies  Allergen Reactions   Codeine     Weird dreams and light headed    Past Medical History:  Diagnosis Date   Allergy    Anxiety    BLADDER STONE 2021   Depression    Elevated PSA since june 2021   Hyperlipidemia    Prostate cancer  (HCC)    Sleep apnea    DID NOT TOLERATE CPAP OR MOUTH GUARD MODERATE OSA SLEEP STUDY 2017   Wears glasses 10/07/2021   OR CONTACTS    Past Surgical History:  Procedure Laterality Date   COLONOSCOPY  09/27/2021   CYSTOSCOPY N/A 10/13/2021   Procedure: CYSTOSCOPY FLEXIBLE;  Surgeon: Homero Luster, MD;  Location: [facility];  Service: Urology;  Laterality: N/A;  no seeds found in bladder   CYSTOSCOPY WITH LITHOLAPAXY N/A 08/14/2020   Procedure: CYSTOSCOPY WITH REMOVAL OF BLADDER STONE;  Surgeon: Homero Luster, MD;  Location: [facility];  Service: Urology;  Laterality: N/A;   POLYPECTOMY     PROSTATE BIOPSY N/A 08/14/2020   Procedure: BIOPSY TRANSRECTAL ULTRASONIC PROSTATE (TUBP);  Surgeon: Homero Luster, MD;  Location: [facility];  Service: Urology;  Laterality: N/A;   RADIOACTIVE SEED IMPLANT N/A 10/13/2021   Procedure: RADIOACTIVE SEED IMPLANT/BRACHYTHERAPY IMPLANT;  Surgeon: Homero Luster, MD;  Location: [facility];  Service: Urology;  Laterality: N/A;  67   seeds implanted   SPACE OAR INSTILLATION N/A 10/13/2021   Procedure: SPACE OAR INSTILLATION;  Surgeon: Homero Luster, MD;  Location: [facility];  Service: Urology;  Laterality: N/A;   TONSILLECTOMY Bilateral 2000    Social History   Socioeconomic History   Marital status: Single    Spouse name: Not on file   Number of children: Not on file   Years of education: Not on file   Highest education level: Bachelor's degree (e.g., BA, AB, BS)  Occupational History   Occupation: IT trainer  Tobacco Use   Smoking status: Never   Smokeless tobacco: Never  Vaping Use   Vaping status: Never Used  Substance and Sexual Activity   Alcohol use: Not Currently   Drug use: Never   Sexual activity: Not on file  Other Topics Concern   Not on file  Social History Narrative   Patient lives with his mother and takes care of her   Right handed   Caffeine: 1 cup coffee and 2 soft  drinks daily   Social Drivers of Health   Financial Resource Strain: Low Risk  (03/01/2024)   Overall Financial Resource Strain (CARDIA)    Difficulty of Paying Living Expenses: Not hard at all  Food Insecurity: No Food Insecurity (03/01/2024)   Hunger Vital Sign    Worried About Running Out of Food in the Last Year: Never true    Ran Out of Food in the Last Year: Never true  Transportation Needs: No Transportation Needs (03/01/2024)   PRAPARE - Administrator, Civil Service (Medical): No    Lack of Transportation (Non-Medical): No  Physical Activity: Sufficiently Active (03/01/2024)   Exercise Vital Sign    Days of Exercise per Week: 4 days    Minutes of Exercise per Session: 40 min  Stress: Stress Concern Present (03/01/2024)   [employer] of Occupational Health - Occupational Stress Questionnaire    Feeling of Stress : To some extent  Social Connections: Socially Isolated (03/01/2024)   Social Connection and Isolation Panel [  NHANES]    Frequency of Communication with Friends and Family: Three times a week    Frequency of Social Gatherings with Friends and Family: Twice a week    Attends Religious Services: Never    Database administrator or Organizations: No    Attends Engineer, structural: Not on file    Marital Status: Never married  Catering manager Violence: Not on file    Family History  Problem Relation Age of Onset   COPD Mother    Diabetes Mother    Hypertension Mother    Alcohol abuse Father    COPD Brother    Colon cancer Neg Hx    Rectal cancer Neg Hx    Stomach cancer Neg Hx    Sleep apnea Neg Hx        possibly father but not diagnosed    Anti-infectives: Anti-infectives (From admission, onward)    None       Current Outpatient Medications  Medication Sig Dispense Refill   alfuzosin  (UROXATRAL ) 10 MG 24 hr tablet Take 1 tablet (10 mg total) by mouth daily with breakfast. 90 tablet 3   cetirizine (ZYRTEC) 10 MG tablet Take  10 mg by mouth daily.     doxepin  (SINEQUAN ) 10 MG capsule TAKE ONE CAPSULE AT BEDTIME AS NEEDED 90 capsule 3   fluticasone (FLONASE) 50 MCG/ACT nasal spray Place 1 spray into both nostrils daily.     montelukast  (SINGULAIR ) 10 MG tablet Take 1 tablet (10 mg total) by mouth at bedtime. 90 tablet 4   rosuvastatin  (CRESTOR ) 10 MG tablet Take 1 tablet (10 mg total) by mouth at bedtime. 90 tablet 4   SF 5000 PLUS 1.1 % CREA dental cream Take by mouth at bedtime. FLOURIDE CREAM     valACYclovir (VALTREX) 1000 MG tablet Take 1,000 mg by mouth as needed.     No current facility-administered medications for this visit.     Objective: Vital signs in last 24 hours: BP 135/76   Pulse 73   Intake/Output from previous day: No intake/output data recorded. Intake/Output this shift: @IOTHISSHIFT @   Physical Exam Vitals reviewed.  Constitutional:      Appearance: Normal appearance.  Neurological:     Mental Status: He is alert.     Lab Results:   Results for orders placed or performed in visit on 03/22/24 (from the past 24 hours)  Urinalysis, Routine w reflex microscopic     Status: None   Collection Time: 03/22/24 11:27 AM  Result Value Ref Range   Specific Gravity, UA 1.020 1.005 - 1.030   pH, UA 6.0 5.0 - 7.5   Color, UA Yellow Yellow   Appearance Ur Clear Clear   Leukocytes,UA Negative Negative   Protein,UA Negative Negative/Trace   Glucose, UA Negative Negative   Ketones, UA Negative Negative   RBC, UA Negative Negative   Bilirubin, UA Negative Negative   Urobilinogen, Ur 0.2 0.2 - 1.0 mg/dL   Nitrite, UA Negative Negative   Microscopic Examination Comment    Narrative   Performed at:  [id number] [facility] [address], [place], [place]  [id number] Lab Director: Liliana Regulus MT, Phone:  [phone]       Lab Results  Component Value Date   PSA1 0.8 03/15/2024   PSA1 0.9 08/10/2023   PSA1 1.3 02/17/2023   UA is clear.  Studies/Results: No results  found.     Assessment/Plan Prostate cancer.   He has T1c Nx Mx favorable  intermediate risk prostate cancer.  He is doing well s/p seeds with a further decline in the PSA.  BPH with BOO.  His symptoms are stable.  He will remain on alfuzosin .   Renal stone.   He had a possible 6 mm left renal stone on imaging at his last visit that wasn't present on his CT in 2021.Aaron Aas  He will need a KUB in a 6 months  Meds ordered this encounter  Medications   alfuzosin  (UROXATRAL ) 10 MG 24 hr tablet    Sig: Take 1 tablet (10 mg total) by mouth daily with breakfast.    Dispense:  90 tablet    Refill:  3     Orders Placed This Encounter  Procedures   Urinalysis, Routine w reflex microscopic   PSA    Standing Status:   Future    Expected Date:   09/22/2024    Expiration Date:   03/22/2025   Ambulatory referral to Urology    Referral Priority:   Urgent    Referral Type:   Consultation    Referral Reason:   Patient Preference    Referred to Provider:   Homero Luster, MD    Requested Specialty:   Urology    Number of Visits Requested:   1     Return for He needs f/u in [place] in 6 months. .    CC: Dr. Jannell Memo.      Homero Luster 03/23/2024 [phone]

## 2024-03-23 ENCOUNTER — Encounter: Payer: Self-pay | Admitting: Urology

## 2024-03-26 ENCOUNTER — Encounter: Payer: Self-pay | Admitting: Urology

## 2024-03-26 ENCOUNTER — Ambulatory Visit: Payer: Self-pay

## 2024-03-26 DIAGNOSIS — N200 Calculus of kidney: Secondary | ICD-10-CM

## 2024-03-27 NOTE — Telephone Encounter (Signed)
 FYI and advise

## 2024-04-03 DIAGNOSIS — G4733 Obstructive sleep apnea (adult) (pediatric): Secondary | ICD-10-CM | POA: Diagnosis not present

## 2024-04-17 DIAGNOSIS — L578 Other skin changes due to chronic exposure to nonionizing radiation: Secondary | ICD-10-CM | POA: Diagnosis not present

## 2024-04-17 DIAGNOSIS — Z85828 Personal history of other malignant neoplasm of skin: Secondary | ICD-10-CM | POA: Diagnosis not present

## 2024-04-17 DIAGNOSIS — L814 Other melanin hyperpigmentation: Secondary | ICD-10-CM | POA: Diagnosis not present

## 2024-04-17 DIAGNOSIS — G4733 Obstructive sleep apnea (adult) (pediatric): Secondary | ICD-10-CM | POA: Diagnosis not present

## 2024-04-17 DIAGNOSIS — D225 Melanocytic nevi of trunk: Secondary | ICD-10-CM | POA: Diagnosis not present

## 2024-04-17 DIAGNOSIS — D485 Neoplasm of uncertain behavior of skin: Secondary | ICD-10-CM | POA: Diagnosis not present

## 2024-05-07 DIAGNOSIS — D239 Other benign neoplasm of skin, unspecified: Secondary | ICD-10-CM | POA: Diagnosis not present

## 2024-05-07 DIAGNOSIS — D225 Melanocytic nevi of trunk: Secondary | ICD-10-CM | POA: Diagnosis not present

## 2024-05-07 DIAGNOSIS — L118 Other specified acantholytic disorders: Secondary | ICD-10-CM | POA: Diagnosis not present

## 2024-05-17 DIAGNOSIS — G4733 Obstructive sleep apnea (adult) (pediatric): Secondary | ICD-10-CM | POA: Diagnosis not present

## 2024-06-07 DIAGNOSIS — L219 Seborrheic dermatitis, unspecified: Secondary | ICD-10-CM | POA: Diagnosis not present

## 2024-06-17 DIAGNOSIS — G4733 Obstructive sleep apnea (adult) (pediatric): Secondary | ICD-10-CM | POA: Diagnosis not present

## 2024-06-18 DIAGNOSIS — G4733 Obstructive sleep apnea (adult) (pediatric): Secondary | ICD-10-CM | POA: Diagnosis not present

## 2024-06-21 ENCOUNTER — Encounter: Payer: Self-pay | Admitting: Urology

## 2024-07-03 DIAGNOSIS — G4733 Obstructive sleep apnea (adult) (pediatric): Secondary | ICD-10-CM | POA: Diagnosis not present

## 2024-07-04 ENCOUNTER — Encounter: Payer: Self-pay | Admitting: Family Medicine

## 2024-07-16 ENCOUNTER — Ambulatory Visit: Payer: Self-pay

## 2024-07-16 ENCOUNTER — Encounter: Payer: Self-pay | Admitting: Family Medicine

## 2024-07-16 ENCOUNTER — Telehealth (INDEPENDENT_AMBULATORY_CARE_PROVIDER_SITE_OTHER): Admitting: Family Medicine

## 2024-07-16 DIAGNOSIS — U071 COVID-19: Secondary | ICD-10-CM

## 2024-07-16 MED ORDER — MOLNUPIRAVIR EUA 200MG CAPSULE: 4.0000 | ORAL_CAPSULE | Freq: Two times a day (BID) | ORAL | 0 refills | Status: AC

## 2024-07-16 NOTE — Progress Notes (Signed)
 Subjective:    Patient ID: Samuel Powers, male    DOB: 07/07/1961, 63 y.o.   MRN: [id number]   HPI: Samuel Powers is a 63 y.o. male presenting for   Discussed the use of AI scribe software for clinical note transcription with the patient, who gave verbal consent to proceed.  History of Present Illness Samuel Powers is a 63 year old male who presents with symptoms of COVID-19.  He has been experiencing fatigue and low energy since yesterday morning, initially attributing it to the weather. Last night, he developed nasal congestion and opted not to use his CPAP machine, resulting in only three hours of sleep. At 1 AM, he tested positive for COVID-19 twice.  He reports nasal congestion, postnasal drainage causing a scratchy throat, and a low-grade fever with temperatures ranging from 98.27F to 99.18F. He took Tylenol  early in the morning to alleviate symptoms. No cough is present, similar to his previous COVID-19 infection in December 2022.  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 He is concerned about transmitting the virus to her and his brother, who also has COPD and is awaiting surgery. His mother tested negative for COVID-19, and he has been taking precautions such as masking, cleaning, and isolating in his room to prevent transmission.  He previously contracted COVID-19 in December 2022, shortly after undergoing seed implants for prostate cancer. During that infection, he experienced fever but no cough. He has been in contact with his mother's general practitioner and pulmonologist regarding preventive measures for her.      03/05/2024    8:23 AM 09/07/2023    8:07 AM 07/05/2023    2:37 PM 06/06/2023   10:28 AM 05/23/2023    9:07 AM  Depression screen PHQ 2/9  Decreased Interest 1 1 1 1 1   Down, Depressed, Hopeless 1 1 1 1 1   PHQ - 2 Score 2 2 2 2 2   Altered sleeping 0 0 0 1 2  Tired, decreased energy 1 0 1 1 1    Change in appetite 1 0 1 1 0  Feeling bad or failure about yourself  0 1 1 0 1  Trouble concentrating 1 1 1  0 1  Moving slowly or fidgety/restless 0 0 0 0 0  Suicidal thoughts 0 0 0 0 0  PHQ-9 Score 5 4 6 5 7   Difficult doing work/chores Somewhat difficult Somewhat difficult Somewhat difficult Somewhat difficult Somewhat difficult     Relevant past medical, surgical, family and social history reviewed and updated as indicated.  Interim medical history since our last visit reviewed. Allergies and medications reviewed and updated.  ROS:  Review of Systems   Social History   Tobacco Use  Smoking Status Never  Smokeless Tobacco Never       Objective:     Wt Readings from Last 3 Encounters:  03/05/24 183 lb 9.6 oz (83.3 kg)  02/28/24 184 lb 6.4 oz (83.6 kg)  10/17/23 181 lb (82.1 kg)     Exam deferred. Video visit performed.   Assessment & Plan:  COVID-19 virus infection  Other orders -     molnupiravir  EUA; Take 4 capsules (800 mg total) by mouth 2 (two) times daily for 5 days.  Dispense: 40 capsule; Refill: 0      Diagnoses and all orders for this visit:  COVID-19 virus infection  Other orders -     molnupiravir  EUA (LAGEVRIO ) 200 mg  CAPS capsule; Take 4 capsules (800 mg total) by mouth 2 (two) times daily for 5 days.    Virtual Visit  Note  I discussed the limitations, risks, security and privacy concerns of performing an evaluation and management service by video and the availability of in person appointments. The patient was identified with two identifiers. Pt.expressed understanding and agreed to proceed. Pt. Is at home. Dr. Zollie is in his office.  Follow Up Instructions:   I discussed the assessment and treatment plan with the patient. The patient was provided an opportunity to ask questions and all were answered. The patient agreed with the plan and demonstrated an understanding of the instructions.   The patient was advised to call back or seek an  in-person evaluation if the symptoms worsen or if the condition fails to improve as anticipated.   Follow up plan: Return if symptoms worsen or fail to improve.  Butler Zollie, MD [facility]

## 2024-07-16 NOTE — Telephone Encounter (Signed)
 Apt scheduled.

## 2024-07-16 NOTE — Telephone Encounter (Signed)
  States already has video visit scheduled.  Denies need for triage, states waiting for office to call him back.     Copied from CRM [id number]. Topic: Clinical - Medical Advice >> Jul 16, 2024  8:06 AM Samuel Powers ORN wrote: Reason for CRM: tested positive for covid last night patient would like to get paxlovid or Molnupiravir  ASAP medication for covid have stuffy nose and causing drainage from nose  no fever or cough  live with his 76 yr. old mother with underlying condition

## 2024-07-18 DIAGNOSIS — G4733 Obstructive sleep apnea (adult) (pediatric): Secondary | ICD-10-CM | POA: Diagnosis not present

## 2024-08-10 ENCOUNTER — Encounter: Payer: Self-pay | Admitting: Family Medicine

## 2024-08-13 NOTE — Telephone Encounter (Signed)
 Kim, can you make sure these vaccines are put into the system for him?  Also, can you get him scheduled with [facility].  He is already established with him

## 2024-08-14 DIAGNOSIS — H938X3 Other specified disorders of ear, bilateral: Secondary | ICD-10-CM | POA: Diagnosis not present

## 2024-08-14 DIAGNOSIS — H93293 Other abnormal auditory perceptions, bilateral: Secondary | ICD-10-CM | POA: Diagnosis not present

## 2024-08-14 NOTE — Telephone Encounter (Signed)
 LMOVM to schedule appt with [facility]

## 2024-08-21 ENCOUNTER — Ambulatory Visit (INDEPENDENT_AMBULATORY_CARE_PROVIDER_SITE_OTHER): Admitting: Professional Counselor

## 2024-08-21 DIAGNOSIS — F330 Major depressive disorder, recurrent, mild: Secondary | ICD-10-CM

## 2024-08-21 NOTE — BH Specialist Note (Signed)
 [facility] Follow-up  MRN: [id number] NAME: Samuel Powers Date: 08/21/24  Start time: Start Time: 1000 End time: Stop Time: 1045 Total time: Total Time in Minutes (Visit): 45 Call number: Visit Number: 6-Sixth Visit  Reason for call today:  The patient is a 63 year old male presenting for a collaborative care follow-up and reassessment, as it has been over six months since his last visit. The plan is to re-enroll him into collaborative care due to the return of depressive and anxiety symptoms. The patient previously showed significant improvement with a stabilized mood after several visits but reports that things have become stressful again.  He continues to provide full-time care for his elderly mother, which remains a major source of stress and emotional strain. Work demands have also increased, adding further pressure. The patient describes feeling dissatisfied with his current stage of life, expressing frustration about living with his mother and a desire for more independence, travel, and social engagement. He notes experiencing periods of isolation and persistent stress but continues to maintain his daily functioning.  Screening results indicate a PHQ-9 score of 15 and a GAD-7 score of 14, suggesting a moderate level of depressive and anxiety symptoms. The patient also disclosed concerns about excessive pornography use, describing guilt and discomfort afterward. Psychoeducation and supportive discussion were provided, and the patient was encouraged to explore Sexaholics Anonymous (SA), a 12-step support program that could also foster social connection and accountability.  The plan includes reinitiating full participation in collaborative care, scheduling a psychiatric consultation to review his current medication and determine if adjustments are needed, and following up in two weeks to monitor progress and symptom stabilization.  PHQ-9 Scores:     08/21/2024   10:11 AM 03/05/2024    8:23  AM 09/07/2023    8:07 AM 07/05/2023    2:37 PM 06/06/2023   10:28 AM  Depression screen PHQ 2/9  Decreased Interest 2 1 1 1 1   Down, Depressed, Hopeless 2 1 1 1 1   PHQ - 2 Score 4 2 2 2 2   Altered sleeping 2 0 0 0 1  Tired, decreased energy 2 1 0 1 1  Change in appetite 2 1 0 1 1  Feeling bad or failure about yourself  1 0 1 1 0  Trouble concentrating 3 1 1 1  0  Moving slowly or fidgety/restless 1 0 0 0 0  Suicidal thoughts 0 0 0 0 0  PHQ-9 Score 15 5 4 6 5   Difficult doing work/chores Somewhat difficult Somewhat difficult Somewhat difficult Somewhat difficult Somewhat difficult   GAD-7 Scores:     08/21/2024   10:11 AM 03/05/2024    8:23 AM 09/07/2023    8:07 AM 07/05/2023    2:38 PM  GAD 7 : Generalized Anxiety Score  Nervous, Anxious, on Edge 2 1 1 1   Control/stop worrying 2 1 1 1   Worry too much - different things 2 1 1 1   Trouble relaxing 2 1 1 1   Restless 2 1 0 0  Easily annoyed or irritable 2 0 0 1  Afraid - awful might happen 2 0 0 1  Total GAD 7 Score 14 5 4 6   Anxiety Difficulty Somewhat difficult Somewhat difficult Somewhat difficult Somewhat difficult    Stress Current stressors:  aging mother, work Sleep:  fair Appetite:  Good Coping ability:   Patient taking medications as prescribed:    Current medications:  Outpatient Encounter Medications as of 08/21/2024  Medication Sig   alfuzosin  (UROXATRAL ) 10  MG 24 hr tablet Take 1 tablet (10 mg total) by mouth daily with breakfast.   cetirizine (ZYRTEC) 10 MG tablet Take 10 mg by mouth daily.   doxepin  (SINEQUAN ) 10 MG capsule TAKE ONE CAPSULE AT BEDTIME AS NEEDED   fluticasone (FLONASE) 50 MCG/ACT nasal spray Place 1 spray into both nostrils daily.   montelukast  (SINGULAIR ) 10 MG tablet Take 1 tablet (10 mg total) by mouth at bedtime.   rosuvastatin  (CRESTOR ) 10 MG tablet Take 1 tablet (10 mg total) by mouth at bedtime.   SF 5000 PLUS 1.1 % CREA dental cream Take by mouth at bedtime. FLOURIDE CREAM    valACYclovir (VALTREX) 1000 MG tablet Take 1,000 mg by mouth as needed.   No facility-administered encounter medications on file as of 08/21/2024.     Self-harm Behaviors Risk Assessment Self-harm risk factors:  Depression, aging, anxiety Patient endorses recent thoughts of harming self:  Denies   Danger to Others Risk Assessment Danger to others risk factors:  No Patient endorses recent thoughts of harming others:  Denies  Dynamic Appraisal of Situational Aggression (DASA):      No data to display           Substance Use Assessment Patient recently consumed alcohol:    Alcohol Use Disorder Identification Test (AUDIT):     06/06/2023    8:22 AM 03/01/2024    9:41 AM 07/16/2024    9:16 AM  Alcohol Use Disorder Test (AUDIT)  1. How often do you have a drink containing alcohol? 0 0 0  3. How often do you have six or more drinks on one occasion? 0 0 0   Patient recently used drugs:    Opioid Risk Assessment:    Goals, Interventions and Follow-up Plan Goals: Increase healthy adjustment to current life circumstances Interventions: Behavioral Activation, CBT Cognitive Behavioral Therapy, and Medication Monitoring Follow-up Plan: 2 weeks    Samuel Powers

## 2024-08-31 ENCOUNTER — Telehealth (INDEPENDENT_AMBULATORY_CARE_PROVIDER_SITE_OTHER): Payer: Self-pay | Admitting: Professional Counselor

## 2024-08-31 ENCOUNTER — Telehealth: Payer: Self-pay | Admitting: Family Medicine

## 2024-08-31 DIAGNOSIS — F330 Major depressive disorder, recurrent, mild: Secondary | ICD-10-CM

## 2024-08-31 DIAGNOSIS — Z636 Dependent relative needing care at home: Secondary | ICD-10-CM

## 2024-08-31 DIAGNOSIS — F5101 Primary insomnia: Secondary | ICD-10-CM

## 2024-08-31 MED ORDER — ESCITALOPRAM OXALATE 10 MG PO TABS: 10.0000 mg | ORAL_TABLET | Freq: Every day | ORAL | 1 refills | Status: DC

## 2024-08-31 MED ORDER — HYDROXYZINE HCL 10 MG PO TABS: 10.0000 mg | ORAL_TABLET | Freq: Three times a day (TID) | ORAL | 1 refills | Status: DC | PRN

## 2024-08-31 NOTE — BH Specialist Note (Signed)
 Virtual Behavioral Health Treatment Plan Team Note  MRN: [id number] NAME: Samuel Powers  DATE: 08/31/24  Start time: Start Time: 1145 End time: Stop Time: 1200 Total time: Total Time in Minutes (Visit): 15  Total number of Virtual BH Treatment Team Plan encounters: 2/4  Treatment Team Attendees: Redell Corn and Carter Becker  Collaborative Care Psychiatric Consultant Case Review   Assessment/Provisional Diagnosis 62 year old male with history of BPH, prostate cancer, OSA with CPAP, and insomnia. The patient is referred for anxiety and depression.   Provisional Diagnosis: # MDD, Recurrent, moderately severe   # GAD   Recommendation 1. Continue doxepin  10 mg at bedtime for sleep 2. Recommend Lexapro 10 mg daily 3. Recommend hydroxyzine 10 mg TID PRN anxiety 4. Support group recommendation 5. BH specialist to follow up.   Thank you for your consult. Please contact our collaborative care team for any questions or concerns.   I spent 20 minutes chart reviewing, discussing with [name] Speicalist and documenting in the chart.  Diagnoses:    ICD-10-CM   1. MDD (major depressive disorder), recurrent episode, mild  F33.0       Goals, Interventions and Follow-up Plan Goals: Increase healthy adjustment to current life circumstances Interventions: Behavioral Activation CBT Cognitive Behavioral Therapy Medication Monitoring Medication Management Recommendations: Recommend Lexapro 10mg  Follow-up Plan: 2 weeks  History of the present illness Presenting Problem/Current Symptoms:  The patient is a 63 year old male presenting for a collaborative care follow-up and reassessment, as it has been over six months since his last visit. The plan is to re-enroll him into collaborative care due to the return of depressive and anxiety symptoms. The patient previously showed significant improvement with a stabilized mood after several visits but reports that things have become stressful again.   He  continues to provide full-time care for his elderly mother, which remains a major source of stress and emotional strain. Work demands have also increased, adding further pressure. The patient describes feeling dissatisfied with his current stage of life, expressing frustration about living with his mother and a desire for more independence, travel, and social engagement. He notes experiencing periods of isolation and persistent stress but continues to maintain his daily functioning.   Screening results indicate a PHQ-9 score of 15 and a GAD-7 score of 14, suggesting a moderate level of depressive and anxiety symptoms. The patient also disclosed concerns about excessive pornography use, describing guilt and discomfort afterward. Psychoeducation and supportive discussion were provided, and the patient was encouraged to explore Sexaholics Anonymous (SA), a 12-step support program that could also foster social connection and accountability.   The plan includes reinitiating full participation in collaborative care, scheduling a psychiatric consultation to review his current medication and determine if adjustments are needed, and following up in two weeks to monitor progress and symptom stabilization.  Screenings PHQ-9 Assessments:     08/21/2024   10:11 AM 03/05/2024    8:23 AM 09/07/2023    8:07 AM  Depression screen PHQ 2/9  Decreased Interest 2 1 1   Down, Depressed, Hopeless 2 1 1   PHQ - 2 Score 4 2 2   Altered sleeping 2 0 0  Tired, decreased energy 2 1 0  Change in appetite 2 1 0  Feeling bad or failure about yourself  1 0 1  Trouble concentrating 3 1 1   Moving slowly or fidgety/restless 1 0 0  Suicidal thoughts 0 0 0  PHQ-9 Score 15 5 4   Difficult doing work/chores Somewhat difficult Somewhat difficult  Somewhat difficult   GAD-7 Assessments:     08/21/2024   10:11 AM 03/05/2024    8:23 AM 09/07/2023    8:07 AM 07/05/2023    2:38 PM  GAD 7 : Generalized Anxiety Score  Nervous, Anxious,  on Edge 2 1 1 1   Control/stop worrying 2 1 1 1   Worry too much - different things 2 1 1 1   Trouble relaxing 2 1 1 1   Restless 2 1 0 0  Easily annoyed or irritable 2 0 0 1  Afraid - awful might happen 2 0 0 1  Total GAD 7 Score 14 5 4 6   Anxiety Difficulty Somewhat difficult Somewhat difficult Somewhat difficult Somewhat difficult    Past Medical History Past Medical History:  Diagnosis Date   Allergy    Anxiety    BLADDER STONE 2021   Depression    Elevated PSA since june 2021   Hyperlipidemia    Prostate cancer (HCC)    Sleep apnea    DID NOT TOLERATE CPAP OR MOUTH GUARD MODERATE OSA SLEEP STUDY 2017   Wears glasses 10/07/2021   OR CONTACTS    Vital signs: There were no vitals filed for this visit.  Allergies:  Allergies as of 08/31/2024 - Review Complete 03/23/2024  Allergen Reaction Noted   Codeine  01/01/2020    Medication History Current medications:  Outpatient Encounter Medications as of 08/31/2024  Medication Sig   alfuzosin  (UROXATRAL ) 10 MG 24 hr tablet Take 1 tablet (10 mg total) by mouth daily with breakfast.   cetirizine (ZYRTEC) 10 MG tablet Take 10 mg by mouth daily.   doxepin  (SINEQUAN ) 10 MG capsule TAKE ONE CAPSULE AT BEDTIME AS NEEDED   fluticasone (FLONASE) 50 MCG/ACT nasal spray Place 1 spray into both nostrils daily.   montelukast  (SINGULAIR ) 10 MG tablet Take 1 tablet (10 mg total) by mouth at bedtime.   rosuvastatin  (CRESTOR ) 10 MG tablet Take 1 tablet (10 mg total) by mouth at bedtime.   SF 5000 PLUS 1.1 % CREA dental cream Take by mouth at bedtime. FLOURIDE CREAM   valACYclovir (VALTREX) 1000 MG tablet Take 1,000 mg by mouth as needed.   No facility-administered encounter medications on file as of 08/31/2024.     Scribe for Treatment Team: [name]

## 2024-08-31 NOTE — Telephone Encounter (Signed)
 Discussed care recommendations as outlined by integrative behavioral specialists.  I have consulted with our clinical pharmacist to make sure that we do not need to be worried about QTc prolongation and the use of cyclic antidepressant plus SSRI.  Apparently there is a low risk with this particular combination.  She is going to go ahead and try and see if she can get the doxepin  6 mg for him rather than the 10 mg just to continue reducing risk as we may need to increase Lexapro to higher doses in the future.  He has follow-up with me in about 3 weeks and have encouraged him to contact me prior to that time if needed  Meds ordered this encounter  Medications   escitalopram (LEXAPRO) 10 MG tablet    Sig: Take 1 tablet (10 mg total) by mouth daily.    Dispense:  90 tablet    Refill:  1   hydrOXYzine (ATARAX) 10 MG tablet    Sig: Take 1 tablet (10 mg total) by mouth 3 (three) times daily as needed for anxiety (or sedation).    Dispense:  30 tablet    Refill:  1

## 2024-09-04 ENCOUNTER — Ambulatory Visit (INDEPENDENT_AMBULATORY_CARE_PROVIDER_SITE_OTHER): Admitting: Professional Counselor

## 2024-09-04 DIAGNOSIS — F330 Major depressive disorder, recurrent, mild: Secondary | ICD-10-CM | POA: Diagnosis not present

## 2024-09-04 NOTE — BH Specialist Note (Unsigned)
 [facility] Virtual BH Telephone Follow-up  MRN: [id number] NAME: Samuel Powers Date: 09/04/24  Start time: Start Time: 1000 End time: Stop Time: 1030 Total time: Total Time in Minutes (Visit): 30 Call number: Visit Number: Additional Visit  Reason for call today:    PHQ-9 Scores:     09/04/2024   10:04 AM 08/21/2024   10:11 AM 03/05/2024    8:23 AM 09/07/2023    8:07 AM 07/05/2023    2:37 PM  Depression screen PHQ 2/9  Decreased Interest 2 2 1 1 1   Down, Depressed, Hopeless 2 2 1 1 1   PHQ - 2 Score 4 4 2 2 2   Altered sleeping 2 2 0 0 0  Tired, decreased energy 2 2 1  0 1  Change in appetite 2 2 1  0 1  Feeling bad or failure about yourself  1 1 0 1 1  Trouble concentrating 1 3 1 1 1   Moving slowly or fidgety/restless 1 1 0 0 0  Suicidal thoughts 0 0 0 0 0  PHQ-9 Score 13 15 5 4 6   Difficult doing work/chores Somewhat difficult Somewhat difficult Somewhat difficult Somewhat difficult Somewhat difficult   GAD-7 Scores:     09/04/2024   10:05 AM 08/21/2024   10:11 AM 03/05/2024    8:23 AM 09/07/2023    8:07 AM  GAD 7 : Generalized Anxiety Score  Nervous, Anxious, on Edge 2 2 1 1   Control/stop worrying 2 2 1 1   Worry too much - different things 2 2 1 1   Trouble relaxing 2 2 1 1   Restless 2 2 1  0  Easily annoyed or irritable 1 2 0 0  Afraid - awful might happen 1 2 0 0  Total GAD 7 Score 12 14 5 4   Anxiety Difficulty Somewhat difficult Somewhat difficult Somewhat difficult Somewhat difficult    Stress Current stressors:  Work Sleep:  Good past few days Appetite:  Good Coping ability:  Good Patient taking medications as prescribed:  Yes  Current medications:  Outpatient Encounter Medications as of 09/04/2024  Medication Sig   alfuzosin  (UROXATRAL ) 10 MG 24 hr tablet Take 1 tablet (10 mg total) by mouth daily with breakfast.   cetirizine (ZYRTEC) 10 MG tablet Take 10 mg by mouth daily.   doxepin  (SINEQUAN ) 10 MG capsule TAKE ONE CAPSULE AT BEDTIME AS NEEDED    escitalopram (LEXAPRO) 10 MG tablet Take 1 tablet (10 mg total) by mouth daily.   fluticasone (FLONASE) 50 MCG/ACT nasal spray Place 1 spray into both nostrils daily.   hydrOXYzine (ATARAX) 10 MG tablet Take 1 tablet (10 mg total) by mouth 3 (three) times daily as needed for anxiety (or sedation).   montelukast  (SINGULAIR ) 10 MG tablet Take 1 tablet (10 mg total) by mouth at bedtime.   rosuvastatin  (CRESTOR ) 10 MG tablet Take 1 tablet (10 mg total) by mouth at bedtime.   SF 5000 PLUS 1.1 % CREA dental cream Take by mouth at bedtime. FLOURIDE CREAM   valACYclovir (VALTREX) 1000 MG tablet Take 1,000 mg by mouth as needed.   No facility-administered encounter medications on file as of 09/04/2024.     Self-harm Behaviors Risk Assessment Self-harm risk factors:  No Patient endorses recent thoughts of harming self:  Denies   Danger to Others Risk Assessment Danger to others risk factors:   Patient endorses recent thoughts of harming others:     Substance Use Assessment Patient recently consumed alcohol:    Alcohol Use Disorder Identification Test (  AUDIT):     06/06/2023    8:22 AM 03/01/2024    9:41 AM 07/16/2024    9:16 AM  Alcohol Use Disorder Test (AUDIT)  1. How often do you have a drink containing alcohol? 0 0 0  3. How often do you have six or more drinks on one occasion? 0 0 0   Patient recently used drugs:    Opioid Risk Assessment:    Goals, Interventions and Follow-up Plan Goals: {IBH Goals:21014053} Interventions: {IBH Interventions:21014054} Follow-up Plan: {Virtual BH Follow up Recommendations:21014064}  Summary: ***  Samuel Powers

## 2024-09-18 ENCOUNTER — Ambulatory Visit: Payer: Self-pay | Admitting: Professional Counselor

## 2024-09-18 DIAGNOSIS — F330 Major depressive disorder, recurrent, mild: Secondary | ICD-10-CM

## 2024-09-18 NOTE — BH Specialist Note (Signed)
  Follow-up  MRN: [id number] NAME: Samuel Powers Date: 09/18/24  Start time: Start Time: 0900 End time: Stop Time: 0930 Total time: Total Time in Minutes (Visit): 30  Reason for call today:  The patient is a 63 year old male presenting for a collaborative care follow-up. He presents to the session in a positive mood and reports steady improvement overall. The patient notes that his medication was working very well initially but seems to have leveled off somewhat, and he is curious to see how its effectiveness develops over time. Despite this, he continues to experience noticeable benefits from both the medication adjustment and ongoing therapy. He reports making positive behavioral changes that have contributed to his improved mood and daily functioning. His affect and overall presentation appear brighter, and his screening scores have shown a slight decrease in symptoms. The plan is to continue his current treatment approach and coping strategies, with a follow-up scheduled in one month to monitor his progress and medication response.  PHQ-9 Scores:     09/18/2024    9:17 AM 09/04/2024   10:04 AM 08/21/2024   10:11 AM 03/05/2024    8:23 AM 09/07/2023    8:07 AM  Depression screen PHQ 2/9  Decreased Interest 1 2 2 1 1   Down, Depressed, Hopeless 2 2 2 1 1   PHQ - 2 Score 3 4 4 2 2   Altered sleeping 1 2 2  0 0  Tired, decreased energy 1 2 2 1  0  Change in appetite 1 2 2 1  0  Feeling bad or failure about yourself  2 1 1  0 1  Trouble concentrating 1 1 3 1 1   Moving slowly or fidgety/restless 1 1 1  0 0  Suicidal thoughts 0 0 0 0 0  PHQ-9 Score 10 13  15  5  4    Difficult doing work/chores Somewhat difficult Somewhat difficult Somewhat difficult Somewhat difficult Somewhat difficult     Data saved with a previous flowsheet row definition   GAD-7 Scores:     09/18/2024    9:19 AM 09/04/2024   10:05 AM 08/21/2024   10:11 AM 03/05/2024    8:23 AM  GAD 7 : Generalized Anxiety  Score  Nervous, Anxious, on Edge 1 2 2 1   Control/stop worrying 2 2 2 1   Worry too much - different things 2 2 2 1   Trouble relaxing 2 2 2 1   Restless 1 2 2 1   Easily annoyed or irritable 2 1 2  0  Afraid - awful might happen 1 1 2  0  Total GAD 7 Score 11 12 14 5   Anxiety Difficulty Somewhat difficult Somewhat difficult Somewhat difficult Somewhat difficult    Stress Current stressors:  work  Sleep:  good Appetite:  good Coping ability:  good Patient taking medications as prescribed:  yes  Current medications:  Outpatient Encounter Medications as of 09/18/2024  Medication Sig   alfuzosin  (UROXATRAL ) 10 MG 24 hr tablet Take 1 tablet (10 mg total) by mouth daily with breakfast.   cetirizine (ZYRTEC) 10 MG tablet Take 10 mg by mouth daily.   doxepin  (SINEQUAN ) 10 MG capsule TAKE ONE CAPSULE AT BEDTIME AS NEEDED   escitalopram (LEXAPRO) 10 MG tablet Take 1 tablet (10 mg total) by mouth daily.   fluticasone (FLONASE) 50 MCG/ACT nasal spray Place 1 spray into both nostrils daily.   hydrOXYzine (ATARAX) 10 MG tablet Take 1 tablet (10 mg total) by mouth 3 (three) times daily as needed for anxiety (or sedation).  montelukast  (SINGULAIR ) 10 MG tablet Take 1 tablet (10 mg total) by mouth at bedtime.   rosuvastatin  (CRESTOR ) 10 MG tablet Take 1 tablet (10 mg total) by mouth at bedtime.   SF 5000 PLUS 1.1 % CREA dental cream Take by mouth at bedtime. FLOURIDE CREAM   valACYclovir (VALTREX) 1000 MG tablet Take 1,000 mg by mouth as needed.   No facility-administered encounter medications on file as of 09/18/2024.     Self-harm Behaviors Risk Assessment Self-harm risk factors:  depression,  Patient endorses recent thoughts of harming self:  Denies   Danger to Others Risk Assessment Danger to others risk factors: depression  Patient endorses recent thoughts of harming others:  Denies   Substance Use Assessment Patient recently consumed alcohol:  depression  Alcohol Use Disorder  Identification Test (AUDIT):     06/06/2023    8:22 AM 03/01/2024    9:41 AM 07/16/2024    9:16 AM  Alcohol Use Disorder Test (AUDIT)  1. How often do you have a drink containing alcohol? 0 0 0  3. How often do you have six or more drinks on one occasion? 0 0 0   Goals, Interventions and Follow-up Plan Goals: Increase healthy adjustment to current life circumstances Interventions: Behavioral Activation Follow-up Plan: 2 weeks  Samuel Powers

## 2024-09-19 ENCOUNTER — Other Ambulatory Visit

## 2024-09-19 ENCOUNTER — Ambulatory Visit (HOSPITAL_COMMUNITY)
Admission: RE | Admit: 2024-09-19 | Discharge: 2024-09-19 | Disposition: A | Discharge status: Home / Self Care | Source: Ambulatory Visit | Attending: Urology | Admitting: Urology

## 2024-09-19 DIAGNOSIS — I878 Other specified disorders of veins: Secondary | ICD-10-CM | POA: Diagnosis not present

## 2024-09-19 DIAGNOSIS — N200 Calculus of kidney: Secondary | ICD-10-CM | POA: Diagnosis not present

## 2024-09-20 LAB — PSA: Prostate Specific Ag, Serum: 0.5 ng/mL (ref 0.0–4.0)

## 2024-09-21 ENCOUNTER — Ambulatory Visit: Payer: Self-pay

## 2024-09-25 ENCOUNTER — Ambulatory Visit: Payer: Self-pay | Admitting: Family Medicine

## 2024-09-25 ENCOUNTER — Encounter: Payer: Self-pay | Admitting: Family Medicine

## 2024-09-25 VITALS — BP 122/66 | HR 72 | Temp 97.7°F | Ht 70.0 in | Wt 188.0 lb

## 2024-09-25 DIAGNOSIS — Z0001 Encounter for general adult medical examination with abnormal findings: Secondary | ICD-10-CM | POA: Diagnosis not present

## 2024-09-25 DIAGNOSIS — G4733 Obstructive sleep apnea (adult) (pediatric): Secondary | ICD-10-CM

## 2024-09-25 DIAGNOSIS — F330 Major depressive disorder, recurrent, mild: Secondary | ICD-10-CM

## 2024-09-25 DIAGNOSIS — E7800 Pure hypercholesterolemia, unspecified: Secondary | ICD-10-CM | POA: Diagnosis not present

## 2024-09-25 DIAGNOSIS — Z0000 Encounter for general adult medical examination without abnormal findings: Secondary | ICD-10-CM

## 2024-09-25 DIAGNOSIS — Z8546 Personal history of malignant neoplasm of prostate: Secondary | ICD-10-CM

## 2024-09-25 DIAGNOSIS — J301 Allergic rhinitis due to pollen: Secondary | ICD-10-CM

## 2024-09-25 DIAGNOSIS — F5101 Primary insomnia: Secondary | ICD-10-CM

## 2024-09-25 DIAGNOSIS — Z23 Encounter for immunization: Secondary | ICD-10-CM

## 2024-09-25 MED ORDER — ESCITALOPRAM OXALATE 10 MG PO TABS: 10.0000 mg | ORAL_TABLET | Freq: Every day | ORAL | 4 refills | Status: DC

## 2024-09-25 MED ORDER — ROSUVASTATIN CALCIUM 10 MG PO TABS: 10.0000 mg | ORAL_TABLET | Freq: Every day | ORAL | 4 refills | Status: AC

## 2024-09-25 MED ORDER — DOXEPIN HCL 10 MG PO CAPS: 10.0000 mg | ORAL_CAPSULE | Freq: Every evening | ORAL | 4 refills | Status: AC | PRN

## 2024-09-25 MED ORDER — HYDROXYZINE HCL 10 MG PO TABS: 5.0000 mg | ORAL_TABLET | Freq: Three times a day (TID) | ORAL | 99 refills | Status: DC | PRN

## 2024-09-25 MED ORDER — MONTELUKAST SODIUM 10 MG PO TABS: 10.0000 mg | ORAL_TABLET | Freq: Every day | ORAL | 4 refills | Status: AC

## 2024-09-25 NOTE — Progress Notes (Signed)
 Samuel Powers is a 63 y.o. male presents to office today for annual physical exam examination.    Overall he reports he is doing well.  Continues to have some stress related to the care of his mother, who has been having worsening osteoarthritic issues and has been more dependent on assistance for mobility.  He continues to follow-up with counseling services regularly and does find this helpful.  Lexapro seems to be doing something but not quite sure if it is going to be fully effective yet as he is only been on it for about 3 weeks.  Not reporting any heart palpitations or complications with concomitant use of doxepin  at bedtime, which he uses regularly for sleep. He has been utilizing half a tablet of Atarax at bedtime as well which is allowing him to relax to go to bed.  He had tried the 10 mg fully but found that it was overly sedating during the daytime so discontinued this.  Compliant with oral antihistamines and reports good control of allergies.  Urinary symptoms are well-controlled and he has an appointment with his urologist tomorrow.  PSA is still declining and this is great news.  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  He is compliant with statin reports no chest pain, shortness of breath or other concerning features.  He is also seeing his dermatologist and had a lesion removed of his back but this was not considered cancerous  He exercises regularly and eats a balanced diet.  Had COVID in September so has deferred COVID vaccination until December  Occupation: finance, Marital status: single, Substance use: none Health Maintenance Due  Topic Date Due   Pneumococcal Vaccine: 50+ Years (2 of 2 - PCV) 04/17/2022   COVID-19 Vaccine (8 - 2025-26 season) 07/09/2024    Immunization History  Administered Date(s) Administered   Influenza,inj,Quad PF,6+ Mos 08/17/2019, 09/09/2020, 08/13/2023   Influenza-Unspecified  08/22/2021, 07/31/2022, 08/10/2024   Moderna SARS-COV2 Booster Vaccination 02/27/2022   Moderna Sars-Covid-2 Vaccination 01/17/2020, 02/16/2020, 09/05/2020, 02/15/2021, 07/18/2021, 07/31/2022   Pfizer Covid-19 Vaccine Bivalent Booster 12yrs & up 07/15/2023   Pneumococcal Polysaccharide-23 04/17/2021   Tdap 04/09/2020   Zoster Recombinant(Shingrix ) 09/10/2022, 09/23/2023   Past Medical History:  Diagnosis Date   Allergy    Anxiety    BLADDER STONE 2021   Depression    Elevated PSA since june 2021   Hyperlipidemia    Prostate cancer (HCC)    Sleep apnea    DID NOT TOLERATE CPAP OR MOUTH GUARD MODERATE OSA SLEEP STUDY 2017   Wears glasses 10/07/2021   OR CONTACTS   Social History   Socioeconomic History   Marital status: Single    Spouse name: Not on file   Number of children: Not on file   Years of education: Not on file   Highest education level: Bachelor's degree (e.g., BA, AB, BS)  Occupational History   Occupation: IT TRAINER  Tobacco Use   Smoking status: Never   Smokeless tobacco: Never  Vaping Use   Vaping status: Never Used  Substance and Sexual Activity   Alcohol use: Not Currently   Drug use: Never   Sexual activity: Not on file  Other Topics Concern   Not on file  Social History Narrative   Patient lives with his mother and takes care of her   Right handed   Caffeine: 1 cup coffee and 2 soft drinks daily  Social Drivers of Corporate Investment Banker Strain: Low Risk  (09/21/2024)   Overall Financial Resource Strain (CARDIA)    Difficulty of Paying Living Expenses: Not hard at all  Food Insecurity: No Food Insecurity (09/21/2024)   Hunger Vital Sign    Worried About Running Out of Food in the Last Year: Never true    Ran Out of Food in the Last Year: Never true  Transportation Needs: No Transportation Needs (09/21/2024)   PRAPARE - Administrator, Civil Service (Medical): No    Lack of Transportation (Non-Medical): No  Physical Activity:  Insufficiently Active (09/21/2024)   Exercise Vital Sign    Days of Exercise per Week: 2 days    Minutes of Exercise per Session: 40 min  Stress: Stress Concern Present (09/21/2024)   [employer] of Occupational Health - Occupational Stress Questionnaire    Feeling of Stress: To some extent  Social Connections: Socially Isolated (09/21/2024)   Social Connection and Isolation Panel    Frequency of Communication with Friends and Family: More than three times a week    Frequency of Social Gatherings with Friends and Family: Once a week    Attends Religious Services: Never    Database Administrator or Organizations: No    Attends Engineer, Structural: Not on file    Marital Status: Never married  Intimate Partner Violence: Not on file   Past Surgical History:  Procedure Laterality Date   COLONOSCOPY  09/27/2021   CYSTOSCOPY N/A 10/13/2021   Procedure: CYSTOSCOPY FLEXIBLE;  Surgeon: Watt Rush, MD;  Location: [facility] ;  Service: Urology;  Laterality: N/A;  no seeds found in bladder   CYSTOSCOPY WITH LITHOLAPAXY N/A 08/14/2020   Procedure: CYSTOSCOPY WITH REMOVAL OF BLADDER STONE;  Surgeon: Watt Rush, MD;  Location: [facility];  Service: Urology;  Laterality: N/A;   POLYPECTOMY     PROSTATE BIOPSY N/A 08/14/2020   Procedure: BIOPSY TRANSRECTAL ULTRASONIC PROSTATE (TUBP);  Surgeon: Watt Rush, MD;  Location: [facility];  Service: Urology;  Laterality: N/A;   RADIOACTIVE SEED IMPLANT N/A 10/13/2021   Procedure: RADIOACTIVE SEED IMPLANT/BRACHYTHERAPY IMPLANT;  Surgeon: Watt Rush, MD;  Location: [facility];  Service: Urology;  Laterality: N/A;  67   seeds implanted   SPACE OAR INSTILLATION N/A 10/13/2021   Procedure: SPACE OAR INSTILLATION;  Surgeon: Watt Rush, MD;  Location: [facility];  Service: Urology;  Laterality: N/A;   TONSILLECTOMY Bilateral 2000   Family History  Problem Relation Age  of Onset   COPD Mother    Diabetes Mother    Hypertension Mother    Alcohol abuse Father    COPD Brother    Colon cancer Neg Hx    Rectal cancer Neg Hx    Stomach cancer Neg Hx    Sleep apnea Neg Hx        possibly father but not diagnosed    Current Outpatient Medications:    alfuzosin  (UROXATRAL ) 10 MG 24 hr tablet, Take 1 tablet (10 mg total) by mouth daily with breakfast., Disp: 90 tablet, Rfl: 3   cetirizine (ZYRTEC) 10 MG tablet, Take 10 mg by mouth daily., Disp: , Rfl:    doxepin  (SINEQUAN ) 10 MG capsule, TAKE ONE CAPSULE AT BEDTIME AS NEEDED, Disp: 90 capsule, Rfl: 3   escitalopram (LEXAPRO) 10 MG tablet, Take 1 tablet (10 mg total) by mouth daily., Disp: 90 tablet, Rfl: 1   fluticasone (FLONASE)  50 MCG/ACT nasal spray, Place 1 spray into both nostrils daily., Disp: , Rfl:    hydrOXYzine (ATARAX) 10 MG tablet, Take 1 tablet (10 mg total) by mouth 3 (three) times daily as needed for anxiety (or sedation)., Disp: 30 tablet, Rfl: 1   montelukast  (SINGULAIR ) 10 MG tablet, Take 1 tablet (10 mg total) by mouth at bedtime., Disp: 90 tablet, Rfl: 4   rosuvastatin  (CRESTOR ) 10 MG tablet, Take 1 tablet (10 mg total) by mouth at bedtime., Disp: 90 tablet, Rfl: 4   SF 5000 PLUS 1.1 % CREA dental cream, Take by mouth at bedtime. FLOURIDE CREAM, Disp: , Rfl:    valACYclovir (VALTREX) 1000 MG tablet, Take 1,000 mg by mouth as needed., Disp: , Rfl:   Allergies  Allergen Reactions   Codeine     Weird dreams and light headed     ROS: Review of Systems Pertinent items noted in HPI and remainder of comprehensive ROS otherwise negative.    Physical exam BP 122/66   Pulse 72   Temp 97.7 F (36.5 C)   Ht 5' 10 (1.778 m)   Wt 188 lb (85.3 kg)   SpO2 95%   BMI 26.98 kg/m  General appearance: alert, cooperative, appears stated age, and no distress Head: Normocephalic, without obvious abnormality, atraumatic Eyes: negative findings: lids and lashes normal, conjunctivae and sclerae  normal, corneas clear, and pupils equal, round, reactive to light and accomodation Ears: normal TM's and external ear canals both ears Nose: Nares normal. Septum midline. Mucosa normal. No drainage or sinus tenderness. Throat: lips, mucosa, and tongue normal; teeth and gums normal Neck: no adenopathy, no carotid bruit, supple, symmetrical, trachea midline, and thyroid  not enlarged, symmetric, no tenderness/mass/nodules Back: symmetric, no curvature. ROM normal. No CVA tenderness. Lungs: clear to auscultation bilaterally Chest wall: no tenderness Heart: regular rate and rhythm, S1, S2 normal, no murmur, click, rub or gallop Abdomen: soft, non-tender; bowel sounds normal; no masses,  no organomegaly Extremities: extremities normal, atraumatic, no cyanosis or edema Pulses: 2+ and symmetric Skin: Skin color, texture, turgor normal. No rashes or lesions Lymph nodes: Supraclavicular lymph nodes and anterior cervical lymph nodes without enlargement Neurologic: Alert and oriented X 3, normal strength and tone. Normal symmetric reflexes. Normal coordination and gait      09/18/2024    9:17 AM 09/04/2024   10:04 AM 08/21/2024   10:11 AM  Depression screen PHQ 2/9  Decreased Interest 1 2 2   Down, Depressed, Hopeless 2 2 2   PHQ - 2 Score 3 4 4   Altered sleeping 1 2 2   Tired, decreased energy 1 2 2   Change in appetite 1 2 2   Feeling bad or failure about yourself  2 1 1   Trouble concentrating 1 1 3   Moving slowly or fidgety/restless 1 1 1   Suicidal thoughts 0 0 0  PHQ-9 Score 10 13  15    Difficult doing work/chores Somewhat difficult Somewhat difficult Somewhat difficult     Data saved with a previous flowsheet row definition      09/18/2024    9:19 AM 09/04/2024   10:05 AM 08/21/2024   10:11 AM 03/05/2024    8:23 AM  GAD 7 : Generalized Anxiety Score  Nervous, Anxious, on Edge 1 2 2 1   Control/stop worrying 2 2 2 1   Worry too much - different things 2 2 2 1   Trouble relaxing 2 2 2 1    Restless 1 2 2 1   Easily annoyed or irritable 2 1 2  0  Afraid - awful might happen 1 1 2  0  Total GAD 7 Score 11 12 14 5   Anxiety Difficulty Somewhat difficult Somewhat difficult Somewhat difficult Somewhat difficult    Recent Results (from the past 2160 hours)  PSA     Status: None   Collection Time: 09/19/24  9:58 AM  Result Value Ref Range   Prostate Specific Ag, Serum 0.5 0.0 - 4.0 ng/mL    Comment: Roche ECLIA methodology. According to the American Urological Association, Serum PSA should decrease and remain at undetectable levels after radical prostatectomy. The AUA defines biochemical recurrence as an initial PSA value 0.2 ng/mL or greater followed by a subsequent confirmatory PSA value 0.2 ng/mL or greater. Values obtained with different assay methods or kits cannot be used interchangeably. Results cannot be interpreted as absolute evidence of the presence or absence of malignant disease.      Assessment/ Plan: Alexa ONEIDA Sprague here for annual physical exam.   Annual physical exam  History of prostate cancer - Plan: CBC with Differential, VITAMIN D 25 Hydroxy (Vit-D Deficiency, Fractures)  Pure hypercholesterolemia - Plan: CMP14+EGFR, Lipid Panel, rosuvastatin  (CRESTOR ) 10 MG tablet, TSH  Mild episode of recurrent major depressive disorder - Plan: CMP14+EGFR, escitalopram (LEXAPRO) 10 MG tablet, hydrOXYzine (ATARAX) 10 MG tablet, TSH, VITAMIN D 25 Hydroxy (Vit-D Deficiency, Fractures)  Primary insomnia - Plan: doxepin  (SINEQUAN ) 10 MG capsule, hydrOXYzine (ATARAX) 10 MG tablet  OSA on CPAP - Plan: CBC with Differential  Non-seasonal allergic rhinitis due to pollen - Plan: CBC with Differential, montelukast  (SINGULAIR ) 10 MG tablet  Need for vaccination against Streptococcus pneumoniae   I reviewed his labs and prostate cancer looks to be in remission with seed placement given ongoing decline in PSA.  Will check vitamin D level and CBC.  Nonfasting today so would  like to defer labs to a different day  Continue Lexapro at current dose.  May consider advancing the 20 mg if needed.  No apparent side effects with low-dose doxepin  at this time.  Atarax renewed for as needed use as well  Insomnia stable with current regimen.  Continue CPAP for OSA treatment  Allergies are chronic and stable.  Medications renewed  Pneumococcal vaccination administered  Counseled on healthy lifestyle choices, including diet (rich in fruits, vegetables and lean meats and low in salt and simple carbohydrates) and exercise (at least 30 minutes of moderate physical activity daily).  Patient to follow up 60m for mood  Shelbey Spindler M. Jolinda, DO

## 2024-09-25 NOTE — Addendum Note (Signed)
 Addended by: SHERRE SUZEN PARAS on: 09/25/2024 09:20 AM   Modules accepted: Orders

## 2024-09-26 DIAGNOSIS — Z8546 Personal history of malignant neoplasm of prostate: Secondary | ICD-10-CM | POA: Diagnosis not present

## 2024-09-26 DIAGNOSIS — R351 Nocturia: Secondary | ICD-10-CM | POA: Diagnosis not present

## 2024-09-26 DIAGNOSIS — N401 Enlarged prostate with lower urinary tract symptoms: Secondary | ICD-10-CM | POA: Diagnosis not present

## 2024-09-26 DIAGNOSIS — R350 Frequency of micturition: Secondary | ICD-10-CM | POA: Diagnosis not present

## 2024-09-26 DIAGNOSIS — R3912 Poor urinary stream: Secondary | ICD-10-CM | POA: Diagnosis not present

## 2024-09-30 ENCOUNTER — Telehealth: Admitting: Physician Assistant

## 2024-09-30 DIAGNOSIS — J019 Acute sinusitis, unspecified: Secondary | ICD-10-CM

## 2024-09-30 DIAGNOSIS — B9689 Other specified bacterial agents as the cause of diseases classified elsewhere: Secondary | ICD-10-CM | POA: Diagnosis not present

## 2024-09-30 MED ORDER — AMOXICILLIN-POT CLAVULANATE 875-125 MG PO TABS: 1.0000 | ORAL_TABLET | Freq: Two times a day (BID) | ORAL | 0 refills | Status: DC

## 2024-09-30 NOTE — Progress Notes (Signed)

## 2024-10-01 ENCOUNTER — Other Ambulatory Visit

## 2024-10-01 DIAGNOSIS — Z8546 Personal history of malignant neoplasm of prostate: Secondary | ICD-10-CM

## 2024-10-01 DIAGNOSIS — F330 Major depressive disorder, recurrent, mild: Secondary | ICD-10-CM

## 2024-10-01 DIAGNOSIS — G4733 Obstructive sleep apnea (adult) (pediatric): Secondary | ICD-10-CM

## 2024-10-01 DIAGNOSIS — E7800 Pure hypercholesterolemia, unspecified: Secondary | ICD-10-CM

## 2024-10-01 DIAGNOSIS — J301 Allergic rhinitis due to pollen: Secondary | ICD-10-CM

## 2024-10-01 LAB — LIPID PANEL

## 2024-10-01 MED ORDER — IPRATROPIUM BROMIDE 0.03 % NA SOLN: 2.0000 | Freq: Two times a day (BID) | NASAL | 0 refills | Status: DC

## 2024-10-01 MED ORDER — BENZONATATE 100 MG PO CAPS: 100.0000 mg | ORAL_CAPSULE | Freq: Three times a day (TID) | ORAL | 0 refills | Status: DC | PRN

## 2024-10-01 NOTE — Addendum Note (Signed)
 Addended by: VIVIENNE DELON HERO on: 10/01/2024 01:26 PM   Modules accepted: Orders

## 2024-10-02 ENCOUNTER — Ambulatory Visit: Admitting: Nurse Practitioner

## 2024-10-02 ENCOUNTER — Encounter: Payer: Self-pay | Admitting: Nurse Practitioner

## 2024-10-02 ENCOUNTER — Ambulatory Visit: Payer: Self-pay | Admitting: Family Medicine

## 2024-10-02 VITALS — BP 164/84 | HR 86 | Temp 99.0°F | Ht 70.0 in | Wt 183.0 lb

## 2024-10-02 DIAGNOSIS — J069 Acute upper respiratory infection, unspecified: Secondary | ICD-10-CM | POA: Diagnosis not present

## 2024-10-02 DIAGNOSIS — R509 Fever, unspecified: Secondary | ICD-10-CM | POA: Diagnosis not present

## 2024-10-02 LAB — CBC WITH DIFFERENTIAL/PLATELET
Basophils Absolute: 0 x10E3/uL (ref 0.0–0.2)
Basos: 1 %
EOS (ABSOLUTE): 0 x10E3/uL (ref 0.0–0.4)
Eos: 1 %
Hematocrit: 44.5 % (ref 37.5–51.0)
Hemoglobin: 15.2 g/dL (ref 13.0–17.7)
Immature Grans (Abs): 0 x10E3/uL (ref 0.0–0.1)
Immature Granulocytes: 0 %
Lymphocytes Absolute: 0.5 x10E3/uL — ABNORMAL LOW (ref 0.7–3.1)
Lymphs: 11 %
MCH: 30.9 pg (ref 26.6–33.0)
MCHC: 34.2 g/dL (ref 31.5–35.7)
MCV: 90 fL (ref 79–97)
Monocytes Absolute: 0.3 x10E3/uL (ref 0.1–0.9)
Monocytes: 6 %
Neutrophils Absolute: 4 x10E3/uL (ref 1.4–7.0)
Neutrophils: 81 %
Platelets: 268 x10E3/uL (ref 150–450)
RBC: 4.92 x10E6/uL (ref 4.14–5.80)
RDW: 12.7 % (ref 11.6–15.4)
WBC: 4.9 x10E3/uL (ref 3.4–10.8)

## 2024-10-02 LAB — CMP14+EGFR
ALT: 28 IU/L (ref 0–44)
AST: 25 IU/L (ref 0–40)
Albumin: 4.5 g/dL (ref 3.9–4.9)
Alkaline Phosphatase: 64 IU/L (ref 47–123)
BUN/Creatinine Ratio: 11 (ref 10–24)
BUN: 11 mg/dL (ref 8–27)
Bilirubin Total: 0.7 mg/dL (ref 0.0–1.2)
CO2: 21 mmol/L (ref 20–29)
Calcium: 9.4 mg/dL (ref 8.6–10.2)
Chloride: 101 mmol/L (ref 96–106)
Creatinine, Ser: 0.99 mg/dL (ref 0.76–1.27)
Globulin, Total: 2.4 g/dL (ref 1.5–4.5)
Glucose: 104 mg/dL — AB (ref 70–99)
Potassium: 4.1 mmol/L (ref 3.5–5.2)
Sodium: 139 mmol/L (ref 134–144)
Total Protein: 6.9 g/dL (ref 6.0–8.5)
eGFR: 87 mL/min/1.73 (ref >59)

## 2024-10-02 LAB — VERITOR SARS-COV-2 AND FLU A+B
BD Veritor SARS-CoV-2 Ag: NEGATIVE
Influenza A: NEGATIVE
Influenza B: NEGATIVE

## 2024-10-02 LAB — LIPID PANEL
Cholesterol, Total: 170 mg/dL (ref 100–199)
HDL: 50 mg/dL (ref >39)
LDL CALC COMMENT:: 3.4 ratio (ref 0.0–5.0)
LDL Chol Calc (NIH): 94 mg/dL (ref 0–99)
Triglycerides: 147 mg/dL (ref 0–149)
VLDL Cholesterol Cal: 26 mg/dL (ref 5–40)

## 2024-10-02 LAB — VITAMIN D 25 HYDROXY (VIT D DEFICIENCY, FRACTURES): Vit D, 25-Hydroxy: 45.4 ng/mL (ref 30.0–100.0)

## 2024-10-02 LAB — TSH: TSH: 3.41 u[IU]/mL (ref 0.450–4.500)

## 2024-10-02 MED ORDER — PREDNISONE 20 MG PO TABS: 40.0000 mg | ORAL_TABLET | Freq: Every day | ORAL | 0 refills | Status: AC

## 2024-10-02 NOTE — Progress Notes (Signed)
 Subjective:    Patient ID: Samuel Powers, male    DOB: May 11, 1961, 63 y.o.   MRN: [id number]   Chief Complaint: Fever and Cough (Sinus drainage/)   Fever  Associated symptoms include congestion, coughing and a sore throat. Pertinent negatives include no chest pain.  Cough Associated symptoms include chills, a fever and a sore throat. Pertinent negatives include no chest pain or shortness of breath.    Patient comes in c/o fever and cough since the weekend. He did evisit and was given augmentin  and cough meds. Says that he feels no better. Fever was 102 last night and 100.3 this morning at home.  Patient Active Problem List   Diagnosis Date Noted   OSA on CPAP 03/05/2024   Malignant neoplasm of prostate (HCC)    History of bladder stone 10/13/2020   Benign prostatic hyperplasia without lower urinary tract symptoms 10/13/2020   Stress at home 10/13/2020   Gross hematuria 06/13/2020   Primary insomnia 04/09/2020       Review of Systems  Constitutional:  Positive for chills and fever.  HENT:  Positive for congestion, sore throat and voice change. Negative for sinus pain and trouble swallowing.   Respiratory:  Positive for cough. Negative for shortness of breath.   Cardiovascular:  Negative for chest pain.       Objective:   Physical Exam Constitutional:      Appearance: Normal appearance.  HENT:     Right Ear: Tympanic membrane normal.     Left Ear: Tympanic membrane normal.     Nose: Congestion and rhinorrhea present.     Mouth/Throat:     Pharynx: Posterior oropharyngeal erythema (very mild) present.  Cardiovascular:     Rate and Rhythm: Normal rate and regular rhythm.     Heart sounds: Normal heart sounds.  Pulmonary:     Effort: Pulmonary effort is normal.     Breath sounds: Normal breath sounds.     Comments: Deep barky cough Musculoskeletal:     Cervical back: Normal range of motion and neck supple.  Skin:    General: Skin is warm.  Neurological:      General: No focal deficit present.     Mental Status: He is alert and oriented to person, place, and time.  Psychiatric:        Mood and Affect: Mood normal.        Behavior: Behavior normal.    BP (!) 164/84   Pulse 86   Temp 99 F (37.2 C) (Temporal)   Ht 5' 10 (1.778 m)   Wt 183 lb (83 kg)   SpO2 97%   BMI 26.26 kg/m   Covid- negative Flu - negative      Assessment & Plan:   GRIFFEN FRAYNE in today with chief complaint of Fever and Cough (Sinus drainage/)   1. Fever, unspecified fever cause (Primary) - Veritor SARS-CoV-2 and Flu A+B  2. URI with cough and congestion 1. Take meds as prescribed 2. Use a cool mist humidifier especially during the winter months and when heat has been humid. 3. Use saline nose sprays frequently 4. Saline irrigations of the nose can be very helpful if done frequently.  * 4X daily for 1 week*  * Use of a nettie pot can be helpful with this. Follow directions with this* 5. Drink plenty of fluids 6. Keep thermostat turn down low 7.For any cough or congestion- mucinex or tessalon  perles 8. For fever or aces or pains-  take tylenol  or ibuprofen appropriate for age and weight.  * for fevers greater than 101 orally you may alternate ibuprofen and tylenol  every  3 hours.   Finish augmentin  Steroids as prescribed  Meds ordered this encounter  Medications   predniSONE  (DELTASONE ) 20 MG tablet    Sig: Take 2 tablets (40 mg total) by mouth daily with breakfast for 5 days. 2 po daily for 5 days    Dispense:  10 tablet    Refill:  0    Supervising Provider:   MARYANNE CHEW A [[id number]]       The above assessment and management plan was discussed with the patient. The patient verbalized understanding of and has agreed to the management plan. Patient is aware to call the clinic if symptoms persist or worsen. Patient is aware when to return to the clinic for a follow-up visit. Patient educated on when it is appropriate to go to the emergency  department.   Mary-Margaret Gladis, FNP

## 2024-10-02 NOTE — Patient Instructions (Signed)

## 2024-10-03 DIAGNOSIS — G4733 Obstructive sleep apnea (adult) (pediatric): Secondary | ICD-10-CM | POA: Diagnosis not present

## 2024-10-08 ENCOUNTER — Ambulatory Visit: Payer: Self-pay | Admitting: Nurse Practitioner

## 2024-10-16 ENCOUNTER — Encounter: Payer: Self-pay | Admitting: Family Medicine

## 2024-10-16 ENCOUNTER — Other Ambulatory Visit: Payer: Self-pay | Admitting: Family Medicine

## 2024-10-16 DIAGNOSIS — F330 Major depressive disorder, recurrent, mild: Secondary | ICD-10-CM

## 2024-10-23 ENCOUNTER — Encounter: Payer: Self-pay | Admitting: Family Medicine

## 2024-10-23 ENCOUNTER — Ambulatory Visit: Admitting: Family Medicine

## 2024-10-23 ENCOUNTER — Ambulatory Visit: Admitting: Professional Counselor

## 2024-10-23 ENCOUNTER — Ambulatory Visit: Payer: Self-pay | Admitting: Family Medicine

## 2024-10-23 ENCOUNTER — Ambulatory Visit: Admitting: Nurse Practitioner

## 2024-10-23 VITALS — BP 150/86 | HR 88 | Temp 97.6°F | Ht 70.0 in | Wt 179.6 lb

## 2024-10-23 DIAGNOSIS — N3001 Acute cystitis with hematuria: Secondary | ICD-10-CM | POA: Diagnosis not present

## 2024-10-23 DIAGNOSIS — R1032 Left lower quadrant pain: Secondary | ICD-10-CM

## 2024-10-23 LAB — URINALYSIS, ROUTINE W REFLEX MICROSCOPIC
Glucose, UA: NEGATIVE
Nitrite, UA: NEGATIVE
Specific Gravity, UA: 1.02 (ref 1.005–1.030)
Urobilinogen, Ur: 0.2 mg/dL (ref 0.2–1.0)
pH, UA: 5.5 (ref 5.0–7.5)

## 2024-10-23 LAB — MICROSCOPIC EXAMINATION

## 2024-10-23 MED ORDER — SULFAMETHOXAZOLE-TRIMETHOPRIM 800-160 MG PO TABS: 1.0000 | ORAL_TABLET | Freq: Two times a day (BID) | ORAL | 0 refills | Status: AC

## 2024-10-23 NOTE — Progress Notes (Signed)
 Subjective:  Patient ID: Samuel Powers, male    DOB: 21-Jul-1961, 63 y.o.   MRN: [id number]  Patient Care Team: Jolinda Norene HERO, DO as PCP - General (Family Medicine) Dermatology, Ruthellen Seltzer, Norleen, MD as Attending Physician (Urology)   Chief Complaint:  Abdominal Pain (Left lower abd pain that started Monday morning at 2am)   HPI: Samuel Powers is a 63 y.o. male presenting on 10/23/2024 for Abdominal Pain (Left lower abd pain that started Monday morning at 2am)   Discussed the use of AI scribe software for clinical note transcription with the patient, who gave verbal consent to proceed.  History of Present Illness   Samuel Powers is a 63 year old male who presents with sharp abdominal pain.  He experienced sharp abdominal pain that began in the middle of the night on Monday, which he associates with consuming a large amount of peanuts and popcorn on Sunday. The pain was severe, preventing him from finding a comfortable position and from sleeping the following day. Walking and certain sitting positions alleviated the pain, and using a heating pad provided relief, allowing him to sleep through the night. By Tuesday morning, the pain had resolved. No fever, vomiting, or diarrhea were present.  He has a history of kidney stones, with the last occurrence in 2021 or 2022, and has undergone procedures for stone removal, during which prostate cancer was discovered. He denies recent symptoms typical of kidney stones, such as burning during urination, but notes very dark yellow urine. No fever, chills, or vomiting have been experienced.  He has no known allergies except to codeine.          Relevant past medical, surgical, family, and social history reviewed and updated as indicated.  Allergies and medications reviewed and updated. Data reviewed: Chart in Epic.   Past Medical History:  Diagnosis Date   Allergy    Anxiety    BLADDER STONE 2021   Depression    Elevated PSA  since june 2021   Hyperlipidemia    Prostate cancer (HCC)    Sleep apnea    DID NOT TOLERATE CPAP OR MOUTH GUARD MODERATE OSA SLEEP STUDY 2017   Wears glasses 10/07/2021   OR CONTACTS    Past Surgical History:  Procedure Laterality Date   COLONOSCOPY  09/27/2021   CYSTOSCOPY N/A 10/13/2021   Procedure: CYSTOSCOPY FLEXIBLE;  Surgeon: Seltzer Norleen, MD;  Location: [facility];  Service: Urology;  Laterality: N/A;  no seeds found in bladder   CYSTOSCOPY WITH LITHOLAPAXY N/A 08/14/2020   Procedure: CYSTOSCOPY WITH REMOVAL OF BLADDER STONE;  Surgeon: Seltzer Norleen, MD;  Location: [facility];  Service: Urology;  Laterality: N/A;   POLYPECTOMY     PROSTATE BIOPSY N/A 08/14/2020   Procedure: BIOPSY TRANSRECTAL ULTRASONIC PROSTATE (TUBP);  Surgeon: Seltzer Norleen, MD;  Location: [facility];  Service: Urology;  Laterality: N/A;   RADIOACTIVE SEED IMPLANT N/A 10/13/2021   Procedure: RADIOACTIVE SEED IMPLANT/BRACHYTHERAPY IMPLANT;  Surgeon: Seltzer Norleen, MD;  Location: [facility];  Service: Urology;  Laterality: N/A;  67   seeds implanted   SPACE OAR INSTILLATION N/A 10/13/2021   Procedure: SPACE OAR INSTILLATION;  Surgeon: Seltzer Norleen, MD;  Location: [facility];  Service: Urology;  Laterality: N/A;   TONSILLECTOMY Bilateral 2000    Social History   Socioeconomic History   Marital status: Single    Spouse name: Not on file   Number  of children: Not on file   Years of education: Not on file   Highest education level: Bachelor's degree (e.g., BA, AB, BS)  Occupational History   Occupation: IT TRAINER  Tobacco Use   Smoking status: Never   Smokeless tobacco: Never  Vaping Use   Vaping status: Never Used  Substance and Sexual Activity   Alcohol use: Not Currently   Drug use: Never   Sexual activity: Not on file  Other Topics Concern   Not on file  Social History Narrative   Patient lives with his mother and takes care of her    Right handed   Caffeine: 1 cup coffee and 2 soft drinks daily   Social Drivers of Health   Tobacco Use: Low Risk (10/23/2024)   Patient History    Smoking Tobacco Use: Never    Smokeless Tobacco Use: Never    Passive Exposure: Not on file  Financial Resource Strain: Low Risk (09/21/2024)   Overall Financial Resource Strain (CARDIA)    Difficulty of Paying Living Expenses: Not hard at all  Food Insecurity: No Food Insecurity (09/21/2024)   Epic    Worried About Programme Researcher, Broadcasting/film/video in the Last Year: Never true    Ran Out of Food in the Last Year: Never true  Transportation Needs: No Transportation Needs (09/21/2024)   Epic    Lack of Transportation (Medical): No    Lack of Transportation (Non-Medical): No  Physical Activity: Insufficiently Active (09/21/2024)   Exercise Vital Sign    Days of Exercise per Week: 2 days    Minutes of Exercise per Session: 40 min  Stress: Stress Concern Present (09/21/2024)   [employer] of Occupational Health - Occupational Stress Questionnaire    Feeling of Stress: To some extent  Social Connections: Socially Isolated (09/21/2024)   Social Connection and Isolation Panel    Frequency of Communication with Friends and Family: More than three times a week    Frequency of Social Gatherings with Friends and Family: Once a week    Attends Religious Services: Never    Database Administrator or Organizations: No    Attends Engineer, Structural: Not on file    Marital Status: Never married  Intimate Partner Violence: Not on file  Depression (PHQ2-9): Medium Risk (10/23/2024)   Depression (PHQ2-9)    PHQ-2 Score: 7  Alcohol Screen: Not on file  Housing: Low Risk (09/21/2024)   Epic    Unable to Pay for Housing in the Last Year: No    Number of Times Moved in the Last Year: 0    Homeless in the Last Year: No  Utilities: Not on file  Health Literacy: Not on file    Outpatient Encounter Medications as of 10/23/2024  Medication Sig    alfuzosin  (UROXATRAL ) 10 MG 24 hr tablet Take 1 tablet (10 mg total) by mouth daily with breakfast.   cetirizine (ZYRTEC) 10 MG tablet Take 10 mg by mouth daily.   doxepin  (SINEQUAN ) 10 MG capsule Take 1 capsule (10 mg total) by mouth at bedtime as needed (sleep).   escitalopram  (LEXAPRO ) 10 MG tablet Take 1 tablet (10 mg total) by mouth daily.   fluticasone (FLONASE) 50 MCG/ACT nasal spray Place 1 spray into both nostrils daily.   hydrOXYzine  (ATARAX ) 10 MG tablet Take 0.5-1 tablets (5-10 mg total) by mouth 3 (three) times daily as needed for anxiety (or sedation).   ipratropium (ATROVENT ) 0.03 % nasal spray Place 2 sprays into both nostrils every  12 (twelve) hours.   montelukast  (SINGULAIR ) 10 MG tablet Take 1 tablet (10 mg total) by mouth at bedtime.   rosuvastatin  (CRESTOR ) 10 MG tablet Take 1 tablet (10 mg total) by mouth at bedtime.   SF 5000 PLUS 1.1 % CREA dental cream Take by mouth at bedtime. FLOURIDE CREAM   sulfamethoxazole -trimethoprim  (BACTRIM  DS) 800-160 MG tablet Take 1 tablet by mouth 2 (two) times daily for 7 days.   valACYclovir (VALTREX) 1000 MG tablet Take 1,000 mg by mouth as needed.   [DISCONTINUED] amoxicillin -clavulanate (AUGMENTIN ) 875-125 MG tablet Take 1 tablet by mouth 2 (two) times daily.   [DISCONTINUED] benzonatate  (TESSALON ) 100 MG capsule Take 1-2 capsules (100-200 mg total) by mouth 3 (three) times daily as needed.   No facility-administered encounter medications on file as of 10/23/2024.    Allergies[1]  Pertinent ROS per HPI, otherwise unremarkable      Objective:  BP (!) 150/86   Pulse 88   Temp 97.6 F (36.4 C)   Ht 5' 10 (1.778 m)   Wt 179 lb 9.6 oz (81.5 kg)   SpO2 98%   BMI 25.77 kg/m    Wt Readings from Last 3 Encounters:  10/23/24 179 lb 9.6 oz (81.5 kg)  10/02/24 183 lb (83 kg)  09/25/24 188 lb (85.3 kg)    Physical Exam Vitals and nursing note reviewed.  Constitutional:      General: He is not in acute distress.     Appearance: Normal appearance. He is well-developed and normal weight. He is not ill-appearing, toxic-appearing or diaphoretic.  HENT:     Head: Normocephalic and atraumatic.     Nose: Nose normal.     Mouth/Throat:     Mouth: Mucous membranes are moist.  Eyes:     Pupils: Pupils are equal, round, and reactive to light.  Cardiovascular:     Rate and Rhythm: Normal rate and regular rhythm.     Heart sounds: Normal heart sounds.  Pulmonary:     Effort: Pulmonary effort is normal.     Breath sounds: Normal breath sounds.  Abdominal:     General: Abdomen is flat. Bowel sounds are normal.     Palpations: Abdomen is soft.     Tenderness: There is abdominal tenderness (minimal tenderness to LLQ, no guarding or rebound). There is no right CVA tenderness or left CVA tenderness.  Skin:    General: Skin is warm and dry.     Capillary Refill: Capillary refill takes less than 2 seconds.  Neurological:     General: No focal deficit present.     Mental Status: He is alert and oriented to person, place, and time.  Psychiatric:        Mood and Affect: Mood normal.        Behavior: Behavior normal.        Thought Content: Thought content normal.        Judgment: Judgment normal.     Results for orders placed or performed in visit on 10/02/24  Veritor SARS-CoV-2 and Flu A+B   Collection Time: 10/02/24 11:58 AM   Specimen: Nasal Swab   Nasal Swab  Result Value Ref Range   Influenza A Negative Negative   Influenza B Negative Negative   BD Veritor SARS-CoV-2 Ag Negative Negative       Pertinent labs & imaging results that were available during my care of the patient were reviewed by me and considered in my medical decision making.  Assessment & Plan:  Gianpaolo was seen today for abdominal pain.  Diagnoses and all orders for this visit:  Left lower quadrant abdominal pain -     Urine Culture -     Urinalysis, Routine w reflex microscopic  Acute cystitis with hematuria -      sulfamethoxazole -trimethoprim  (BACTRIM  DS) 800-160 MG tablet; Take 1 tablet by mouth 2 (two) times daily for 7 days.      Acute cystitis with hematuria following recent kidney stone passage Recent passage of a kidney stone confirmed by urinalysis showing 3+ blood, protein, leukocytes, and calcium  oxalate crystals. Symptoms include sharp left groin pain, which has resolved, and dark yellow urine. No fever, chills, or recent signs of kidney stones. No history of diverticulosis or diverticulitis. Differential diagnosis includes infection post-stone passage, which is being treated as a UTI. - Prescribed Bactrim  twice daily for 7 days with food. - Advised to report any worsening symptoms or new issues. - Discussed potential use of Flomax if stone is suspected to be in the bladder. - Sent prescription to [facility] on [address].          Continue all other maintenance medications.  Follow up plan: Return if symptoms worsen or fail to improve.   Continue healthy lifestyle choices, including diet (rich in fruits, vegetables, and lean proteins, and low in salt and simple carbohydrates) and exercise (at least 30 minutes of moderate physical activity daily).   The above assessment and management plan was discussed with the patient. The patient verbalized understanding of and has agreed to the management plan. Patient is aware to call the clinic if they develop any new symptoms or if symptoms persist or worsen. Patient is aware when to return to the clinic for a follow-up visit. Patient educated on when it is appropriate to go to the emergency department.   Rosaline Bruns, FNP-C [facility] [phone]     [1]  Allergies Allergen Reactions   Codeine Other (See Comments)    Weird dreams and light headed

## 2024-10-25 LAB — URINE CULTURE

## 2024-10-29 ENCOUNTER — Encounter: Payer: Self-pay | Admitting: Family Medicine

## 2024-10-29 NOTE — Telephone Encounter (Signed)
 Patient sent another message and was sent to PCP

## 2024-11-04 ENCOUNTER — Encounter (INDEPENDENT_AMBULATORY_CARE_PROVIDER_SITE_OTHER): Payer: Self-pay | Admitting: Family Medicine

## 2024-11-04 DIAGNOSIS — F330 Major depressive disorder, recurrent, mild: Secondary | ICD-10-CM | POA: Diagnosis not present

## 2024-11-04 DIAGNOSIS — F5101 Primary insomnia: Secondary | ICD-10-CM

## 2024-11-05 MED ORDER — ESCITALOPRAM OXALATE 20 MG PO TABS: 20.0000 mg | ORAL_TABLET | Freq: Every day | ORAL | 3 refills | Status: DC

## 2024-11-05 MED ORDER — HYDROXYZINE HCL 10 MG PO TABS: 5.0000 mg | ORAL_TABLET | Freq: Three times a day (TID) | ORAL | 99 refills | Status: AC | PRN

## 2024-11-14 ENCOUNTER — Other Ambulatory Visit: Payer: Self-pay | Admitting: Urology

## 2024-11-26 MED ORDER — FLUOXETINE HCL 20 MG PO CAPS: 20.0000 mg | ORAL_CAPSULE | Freq: Every day | ORAL | 3 refills | Status: AC

## 2024-11-26 NOTE — Addendum Note (Signed)
 Addended by: JOLINDA NORENE HERO on: 11/26/2024 07:25 AM   Modules accepted: Orders

## 2024-11-26 NOTE — Telephone Encounter (Signed)
 Please see the MyChart message reply(ies) for my assessment and plan.    This patient gave consent for this Medical Advice Message and is aware that it may result in a bill to [employer], as well as the possibility of receiving a bill for a co-payment or deductible. They are an established patient, but are not seeking medical advice exclusively about a problem treated during an in person or video visit in the last seven days. I did not recommend an in person or video visit within seven days of my reply.    I spent a total of 10 minutes cumulative time within 7 days through [employer].  Delynn Flavin, DO

## 2024-11-29 NOTE — Patient Instructions (Addendum)
 SURGICAL WAITING ROOM VISITATION Patients having surgery or a procedure may have no more than 2 support people in the waiting area - these visitors may rotate.    Children under the age of 84 will not be allowed to visit due to the increase in respiratory illness  Children under the age of 65 must have an adult with them who is not the patient.  If the patient needs to stay at the hospital during part of their recovery, the visitor guidelines for inpatient rooms apply. Pre-op nurse will coordinate an appropriate time for 1 support person to accompany patient in pre-op.  This support person may not rotate.    Please refer to the [facility] website for the visitor guidelines for Inpatients (after your surgery is over and you are in a regular room).       Your procedure is scheduled on: 12-11-24   Report to [facility] Main Entrance    Report to admitting at 5:15 AM   Call this number if you have problems the morning of surgery [phone]   Do not eat food or drink liquids :After Midnight.          If you have questions, please contact your surgeons office.   FOLLOW  ANY ADDITIONAL PRE OP INSTRUCTIONS YOU RECEIVED FROM YOUR SURGEON'S OFFICE!!!     Oral Hygiene is also important to reduce your risk of infection.                                    Remember - BRUSH YOUR TEETH THE MORNING OF SURGERY WITH YOUR REGULAR TOOTHPASTE   Do NOT smoke after Midnight   Take these medicines the morning of surgery with A SIP OF WATER :    Fluoxetine  (Prozac )   Okay to use Flonase nasal spray   If needed Valacyclovir (Valtrex)  Stop all vitamins and herbal supplements 7 days before surgery  Bring CPAP mask and tubing day of surgery.                              You may not have any metal on your body including  jewelry, and body piercing             Do not wear  lotions, powders, cologne, or deodorant              Men may shave face and neck.   Do not bring valuables to the  hospital. [facility] IS NOT RESPONSIBLE   FOR VALUABLES.   Contacts, dentures or bridgework may not be worn into surgery.   DO NOT BRING YOUR HOME MEDICATIONS TO THE HOSPITAL. PHARMACY WILL DISPENSE MEDICATIONS LISTED ON YOUR MEDICATION LIST TO YOU DURING YOUR ADMISSION IN THE HOSPITAL!    Patients discharged on the day of surgery will not be allowed to drive home.  Someone NEEDS to stay with you for the first 24 hours after anesthesia.   Special Instructions: Bring a copy of your healthcare power of attorney and living will documents the day of surgery if you haven't scanned them before.              Please read over the following fact sheets you were given: IF YOU HAVE QUESTIONS ABOUT YOUR PRE-OP INSTRUCTIONS PLEASE CALL [phone] [place] or [phone]  If you received a COVID test during your pre-op visit  it is requested that you wear a mask when out in public, stay away from anyone that may not be feeling well and notify your surgeon if you develop symptoms. If you test positive for Covid or have been in contact with anyone that has tested positive in the last 10 days please notify you surgeon.  [place] - Preparing for Surgery Before surgery, you can play an important role.  Because skin is not sterile, your skin needs to be as free of germs as possible.  You can reduce the number of germs on your skin by washing with CHG (chlorahexidine gluconate) soap before surgery.  CHG is an antiseptic cleaner which kills germs and bonds with the skin to continue killing germs even after washing. Please DO NOT use if you have an allergy to CHG or antibacterial soaps.  If your skin becomes reddened/irritated stop using the CHG and inform your nurse when you arrive at Short Stay. Do not shave (including legs and underarms) for at least 48 hours prior to the first CHG shower.  You may shave your face/neck.  Please follow these instructions carefully:  1.  Shower with CHG Soap the night before  surgery and the  morning of surgery.  2.  If you choose to wash your hair, wash your hair first as usual with your normal  shampoo.  3.  After you shampoo, rinse your hair and body thoroughly to remove the shampoo.                             4.  Use CHG as you would any other liquid soap.  You can apply chg directly to the skin and wash.  Gently with a scrungie or clean washcloth.  5.  Apply the CHG Soap to your body ONLY FROM THE NECK DOWN.   Do   not use on face/ open                           Wound or open sores. Avoid contact with eyes, ears mouth and   genitals (private parts).                       Wash face,  Genitals (private parts) with your normal soap.             6.  Wash thoroughly, paying special attention to the area where your    surgery  will be performed.  7.  Thoroughly rinse your body with warm water  from the neck down.  8.  DO NOT shower/wash with your normal soap after using and rinsing off the CHG Soap.                9.  Pat yourself dry with a clean towel.            10.  Wear clean pajamas.            11.  Place clean sheets on your bed the night of your first shower and do not  sleep with pets. Day of Surgery : Do not apply any lotions/deodorants the morning of surgery.  Please wear clean clothes to the hospital/surgery center.  FAILURE TO FOLLOW THESE INSTRUCTIONS MAY RESULT IN THE CANCELLATION OF YOUR SURGERY  PATIENT SIGNATURE_________________________________  NURSE SIGNATURE__________________________________  ________________________________________________________________________

## 2024-11-29 NOTE — Progress Notes (Addendum)
 Date of COVID positive in last 90 days:  No  PCP - Norene Fielding, DO Cardiologist - N/A Neurologist - True Mar, MD (for OSA)  Chest x-ray - N/A EKG - N/A Stress Test - N/A ECHO - N/A Cardiac Cath - N/A Pacemaker/ICD device last checked:N/A Spinal Cord Stimulator:N/A  Bowel Prep - N/A  Sleep Study - Yes, +sleep apnea CPAP -  Yes  Fasting Blood Sugar - N/A Checks Blood Sugar _____ times a day  Last dose of GLP1 agonist-  N/A GLP1 instructions:  Do not take after     Last dose of SGLT-2 inhibitors-  N/A SGLT-2 instructions:  Do not take after    Blood Thinner Instructions: N/A Last dose:   Time: Aspirin Instructions:N/A Last Dose:  Activity level:  Can go up a flight of stairs and perform activities of daily living without stopping and without symptoms of chest pain or shortness of breath.  Able to exercise without symptoms  Anesthesia review: N/A  Patient denies shortness of breath, fever, cough and chest pain at PAT appointment  Patient verbalized understanding of instructions that were given to them at the PAT appointment. Patient was also instructed that they will need to review over the PAT instructions again at home before surgery.

## 2024-11-30 ENCOUNTER — Other Ambulatory Visit: Payer: Self-pay

## 2024-11-30 ENCOUNTER — Encounter (HOSPITAL_COMMUNITY): Payer: Self-pay

## 2024-11-30 ENCOUNTER — Encounter (HOSPITAL_COMMUNITY): Admission: RE | Admit: 2024-11-30 | Source: Ambulatory Visit

## 2024-11-30 DIAGNOSIS — Z01818 Encounter for other preprocedural examination: Secondary | ICD-10-CM | POA: Diagnosis present

## 2024-12-03 ENCOUNTER — Encounter (HOSPITAL_COMMUNITY): Admission: RE | Admit: 2024-12-03 | Source: Ambulatory Visit

## 2024-12-10 ENCOUNTER — Encounter (HOSPITAL_COMMUNITY): Payer: Self-pay | Admitting: Urology

## 2024-12-10 NOTE — Anesthesia Preprocedure Evaluation (Signed)
"                                    Anesthesia Evaluation  Patient identified by MRN, date of birth, ID band Patient awake    Reviewed: Allergy & Precautions, NPO status   Airway Mallampati: II  TM Distance: >3 FB     Dental  (+) Teeth Intact, Caps, Dental Advisory Given   Pulmonary sleep apnea and Continuous Positive Airway Pressure Ventilation    breath sounds clear to auscultation       Cardiovascular negative cardio ROS  Rhythm:Regular Rate:Normal     Neuro/Psych  PSYCHIATRIC DISORDERS         GI/Hepatic negative GI ROS, Neg liver ROS,,,  Endo/Other  negative endocrine ROS    Renal/GU negative Renal ROS     Musculoskeletal   Abdominal   Peds  Hematology   Anesthesia Other Findings   Reproductive/Obstetrics                              Anesthesia Physical Anesthesia Plan  ASA: 3  Anesthesia Plan: General   Post-op Pain Management: Minimal or no pain anticipated, Tylenol  PO (pre-op)* and Celebrex  PO (pre-op)*   Induction: Intravenous  PONV Risk Score and Plan: 2 and Ondansetron , Dexamethasone  and Midazolam   Airway Management Planned: Oral ETT and LMA  Additional Equipment: None  Intra-op Plan:   Post-operative Plan: Extubation in OR  Informed Consent: I have reviewed the patients History and Physical, chart, labs and discussed the procedure including the risks, benefits and alternatives for the proposed anesthesia with the patient or authorized representative who has indicated his/her understanding and acceptance.     Dental advisory given  Plan Discussed with: CRNA and Anesthesiologist  Anesthesia Plan Comments:          Anesthesia Quick Evaluation  "

## 2024-12-11 ENCOUNTER — Ambulatory Visit (HOSPITAL_COMMUNITY)

## 2024-12-11 ENCOUNTER — Other Ambulatory Visit: Payer: Self-pay

## 2024-12-11 ENCOUNTER — Encounter (HOSPITAL_COMMUNITY): Payer: Self-pay | Admitting: Anesthesiology

## 2024-12-11 ENCOUNTER — Encounter (HOSPITAL_COMMUNITY): Payer: Self-pay | Admitting: Urology

## 2024-12-11 ENCOUNTER — Encounter (HOSPITAL_COMMUNITY)
Admission: RE | Disposition: A | Discharge status: Home / Self Care | Payer: Self-pay | Source: Home / Self Care | Attending: Urology

## 2024-12-11 ENCOUNTER — Ambulatory Visit (HOSPITAL_COMMUNITY)
Admission: RE | Admit: 2024-12-11 | Discharge: 2024-12-11 | Disposition: A | Discharge status: Home / Self Care | Attending: Urology | Admitting: Urology

## 2024-12-11 DIAGNOSIS — Z79899 Other long term (current) drug therapy: Secondary | ICD-10-CM | POA: Insufficient documentation

## 2024-12-11 DIAGNOSIS — N132 Hydronephrosis with renal and ureteral calculous obstruction: Secondary | ICD-10-CM | POA: Insufficient documentation

## 2024-12-11 DIAGNOSIS — G4730 Sleep apnea, unspecified: Secondary | ICD-10-CM | POA: Insufficient documentation

## 2024-12-11 DIAGNOSIS — Z7952 Long term (current) use of systemic steroids: Secondary | ICD-10-CM | POA: Insufficient documentation

## 2024-12-11 DIAGNOSIS — Z8546 Personal history of malignant neoplasm of prostate: Secondary | ICD-10-CM | POA: Insufficient documentation

## 2024-12-11 MED ORDER — MIDAZOLAM HCL 2 MG/2ML IJ SOLN
INTRAMUSCULAR | Status: AC
  Filled 2024-12-11: qty 2

## 2024-12-11 MED ORDER — FENTANYL CITRATE (PF) 100 MCG/2ML IJ SOLN
INTRAMUSCULAR | Status: AC
  Filled 2024-12-11: qty 2

## 2024-12-11 MED ORDER — ONDANSETRON HCL 4 MG/2ML IJ SOLN
INTRAMUSCULAR | Status: DC | PRN
  Administered 2024-12-11: 4 mg via INTRAVENOUS

## 2024-12-11 MED ORDER — OXYCODONE HCL 5 MG/5ML PO SOLN: 5.0000 mg | Freq: Once | ORAL | Status: DC | PRN

## 2024-12-11 MED ORDER — LACTATED RINGERS IV SOLN: INTRAVENOUS | Status: DC

## 2024-12-11 MED ORDER — DEXAMETHASONE SOD PHOSPHATE PF 10 MG/ML IJ SOLN
INTRAMUSCULAR | Status: AC
  Filled 2024-12-11: qty 3

## 2024-12-11 MED ORDER — SODIUM CHLORIDE 0.9% FLUSH: 3.0000 mL | Freq: Two times a day (BID) | INTRAVENOUS | Status: DC

## 2024-12-11 MED ORDER — CHLORHEXIDINE GLUCONATE 0.12 % MT SOLN
15.0000 mL | Freq: Once | OROMUCOSAL | Status: AC
  Administered 2024-12-11: 15 mL via OROMUCOSAL

## 2024-12-11 MED ORDER — HYDROCODONE-ACETAMINOPHEN 5-325 MG PO TABS: 1.0000 | ORAL_TABLET | Freq: Four times a day (QID) | ORAL | 0 refills | Status: AC | PRN

## 2024-12-11 MED ORDER — FENTANYL CITRATE (PF) 100 MCG/2ML IJ SOLN
INTRAMUSCULAR | Status: DC | PRN
  Administered 2024-12-11: 50 ug via INTRAVENOUS

## 2024-12-11 MED ORDER — SODIUM CHLORIDE 0.9% FLUSH: 3.0000 mL | INTRAVENOUS | Status: DC | PRN

## 2024-12-11 MED ORDER — ACETAMINOPHEN 325 MG PO TABS: 650.0000 mg | ORAL_TABLET | ORAL | Status: DC | PRN

## 2024-12-11 MED ORDER — ACETAMINOPHEN 650 MG RE SUPP: 650.0000 mg | RECTAL | Status: DC | PRN

## 2024-12-11 MED ORDER — ACETAMINOPHEN 500 MG PO TABS
1000.0000 mg | ORAL_TABLET | Freq: Once | ORAL | Status: AC
  Administered 2024-12-11: 1000 mg via ORAL
  Filled 2024-12-11: qty 2

## 2024-12-11 MED ORDER — ONDANSETRON HCL 4 MG/2ML IJ SOLN
INTRAMUSCULAR | Status: AC
  Filled 2024-12-11: qty 6

## 2024-12-11 MED ORDER — IOHEXOL 300 MG/ML  SOLN
INTRAMUSCULAR | Status: DC | PRN
  Administered 2024-12-11: 4 mL

## 2024-12-11 MED ORDER — MEPERIDINE HCL 25 MG/ML IJ SOLN: 6.2500 mg | INTRAMUSCULAR | Status: DC | PRN

## 2024-12-11 MED ORDER — ONDANSETRON HCL 4 MG/2ML IJ SOLN: 4.0000 mg | Freq: Once | INTRAMUSCULAR | Status: DC | PRN

## 2024-12-11 MED ORDER — CELECOXIB 200 MG PO CAPS
200.0000 mg | ORAL_CAPSULE | Freq: Once | ORAL | Status: AC
  Administered 2024-12-11: 200 mg via ORAL
  Filled 2024-12-11: qty 1

## 2024-12-11 MED ORDER — SODIUM CHLORIDE 0.9 % IV SOLN: 250.0000 mL | INTRAVENOUS | Status: DC | PRN

## 2024-12-11 MED ORDER — PROPOFOL 10 MG/ML IV BOLUS
INTRAVENOUS | Status: AC
  Filled 2024-12-11: qty 20

## 2024-12-11 MED ORDER — CEFAZOLIN SODIUM-DEXTROSE 2-4 GM/100ML-% IV SOLN
2.0000 g | INTRAVENOUS | Status: AC
  Administered 2024-12-11: 2 g via INTRAVENOUS
  Filled 2024-12-11: qty 100

## 2024-12-11 MED ORDER — DEXAMETHASONE SODIUM PHOSPHATE 4 MG/ML IJ SOLN
INTRAMUSCULAR | Status: DC | PRN
  Administered 2024-12-11: 8 mg via INTRAVENOUS

## 2024-12-11 MED ORDER — FENTANYL CITRATE (PF) 50 MCG/ML IJ SOSY: 25.0000 ug | PREFILLED_SYRINGE | INTRAMUSCULAR | Status: DC | PRN

## 2024-12-11 MED ORDER — ORAL CARE MOUTH RINSE: 15.0000 mL | Freq: Once | OROMUCOSAL | Status: AC

## 2024-12-11 MED ORDER — LIDOCAINE HCL (PF) 2 % IJ SOLN
INTRAMUSCULAR | Status: AC
  Filled 2024-12-11: qty 15

## 2024-12-11 MED ORDER — PROPOFOL 10 MG/ML IV BOLUS
INTRAVENOUS | Status: DC | PRN
  Administered 2024-12-11: 160 mg via INTRAVENOUS

## 2024-12-11 MED ORDER — OXYCODONE HCL 5 MG PO TABS: 5.0000 mg | ORAL_TABLET | Freq: Once | ORAL | Status: DC | PRN

## 2024-12-11 MED ORDER — OXYCODONE HCL 5 MG PO TABS: 5.0000 mg | ORAL_TABLET | ORAL | Status: DC | PRN

## 2024-12-11 MED ORDER — SODIUM CHLORIDE 0.9 % IR SOLN
Status: DC | PRN
  Administered 2024-12-11: 3000 mL via INTRAVESICAL

## 2024-12-11 MED ORDER — MIDAZOLAM HCL 5 MG/5ML IJ SOLN
INTRAMUSCULAR | Status: DC | PRN
  Administered 2024-12-11: 2 mg via INTRAVENOUS

## 2024-12-11 MED ORDER — LIDOCAINE HCL (CARDIAC) PF 100 MG/5ML IV SOSY
PREFILLED_SYRINGE | INTRAVENOUS | Status: DC | PRN
  Administered 2024-12-11: 80 mg via INTRAVENOUS

## 2024-12-11 NOTE — Transfer of Care (Signed)
 Immediate Anesthesia Transfer of Care Note  Patient: Samuel Powers  Procedure(s) Performed: Procedures: CYSTOSCOPY/URETEROSCOPY/HOLMIUM LASER/STENT PLACEMENT (Left)  Patient Location: PACU  Anesthesia Type:General  Level of Consciousness:  sedated, patient cooperative and responds to stimulation  Airway & Oxygen Therapy:Patient Spontanous Breathing and Patient connected to face mask oxgen  Post-op Assessment:  Report given to PACU RN and Post -op Vital signs reviewed and stable  Post vital signs:  Reviewed and stable  Last Vitals:  Vitals:   12/11/24 0540  BP: (!) 153/94  Pulse: 72  Resp: 20  Temp: 36.6 C  SpO2: 95%    Complications: No apparent anesthesia complications

## 2024-12-11 NOTE — Interval H&P Note (Signed)
 History and Physical Interval Note:  12/11/2024 7:21 AM  Samuel Powers  has presented today for surgery, with the diagnosis of LEFT URETERAL STONE.  The various methods of treatment have been discussed with the patient and family. After consideration of risks, benefits and other options for treatment, the patient has consented to  Procedures: CYSTOSCOPY/URETEROSCOPY/HOLMIUM LASER/STENT PLACEMENT (Left) as a surgical intervention.  The patient's history has been reviewed, patient examined, no change in status, stable for surgery.  I have reviewed the patient's chart and labs.  Questions were answered to the patient's satisfaction.     Adair Lemar

## 2024-12-11 NOTE — Anesthesia Procedure Notes (Signed)
 Procedure Name: LMA Insertion Date/Time: 12/11/2024 7:49 AM  Performed by: Vincenzo Show, CRNAPre-anesthesia Checklist: Patient identified, Emergency Drugs available, Suction available, Patient being monitored and Timeout performed Patient Re-evaluated:Patient Re-evaluated prior to induction Oxygen Delivery Method: Circle system utilized Preoxygenation: Pre-oxygenation with 100% oxygen Induction Type: IV induction Ventilation: Mask ventilation without difficulty LMA: LMA inserted LMA Size: 4.0 Tube size: 4.0 mm Number of attempts: 1 Placement Confirmation: positive ETCO2 and breath sounds checked- equal and bilateral Tube secured with: Tape Dental Injury: Teeth and Oropharynx as per pre-operative assessment  Comments: Easy pass LMA.

## 2024-12-11 NOTE — Op Note (Signed)
 Procedure: 1.  Cystoscopy with left retrograde pyelogram and interpretation. 2.  Left ureteroscopy with holmium laser application, stone extraction and placement of left double-J stent. 3.  Application of fluoroscopy.  Pre-op diagnosis: Left mid ureteral stone.  Postop diagnosis: Same.  Surgeon: Dr. Norleen Seltzer.  Anesthesia: General.  Specimen: Stone fragments, given to family.  Drain: 6 French by 26 cm left Contour double-J stent with tether.  EBL: None.  Complications: None.  Indications: The patient is a 64 year old male with a 6 to 7 mm left mid ureteral stone who is elected ureteroscopy for management.  Procedure: He was taken the operating room where he was given Ancef .  A general anesthetic was induced.  He was placed in lithotomy position and food PSIs.  His perineum and genitalia were prepped Betadine solution was draped in usual sterile fashion.  Cystoscopy was performed using the 21 French scope and 30 degree lens.  Examination revealed a normal urethra.  The external sphincter was intact.  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  Examination of bladder revealed moderate trabeculation with a few cellules but no mucosal lesions.  The ureteral orifices were unremarkable.  The left ureteral orifice was cannulated with 5 French open-ended catheter and Omnipaque  was instilled.  The left retrograde pyelogram demonstrated a normal distal ureter up to a filling defect in the mid ureter consistent with his known stone with proximal hydronephrosis.  A sensor wire was then advanced to the kidney by the stone without difficulty and the cystoscope was removed.  A 28 cm 12/14 French access sheath was then used to dilate the ureter distal to the stone.  Initially the inner core was passed without difficulty to the stone followed by the assembled sheath.  The  sheath was then removed and a 6.5 French semirigid ureteroscope was then passed alongside the wire and the stone was readily visualized.  The stone was then fragmented using the 364 m homing laser fiber with the Moses laser set on the dusting setting.  The left pedal at 0.3 J and 60 Hz was primarily used for fragmentation.  The fragments were then removed using an engage basket leaving only dust and grit.  Once the stone had been eliminated and proximal inspection of the ureter demonstrated no residual fragments, the ureteroscope was removed.  The cystoscope was then reinserted over the wire and a 6 French by 26 cm contour double-J stent was then passed to the kidney under fluoroscopic guidance.  The wire was removed, leaving good coil in the kidney and a good coil in the bladder.  The bladder was then drained evacuating the stone fragments and the cystoscope was removed leaving the stent string exiting urethra.  Final inspection with the cystoscope alongside the string demonstrated no residual stone fragments and the stent in good position.  The cystoscope was removed and the string was secured to the patient's penis.  He was taken down from lithotomy position, his anesthetic was reversed and he was moved to recovery in stable condition.  There were no complications.

## 2024-12-12 ENCOUNTER — Encounter (HOSPITAL_COMMUNITY): Payer: Self-pay | Admitting: Urology

## 2024-12-13 ENCOUNTER — Ambulatory Visit (HOSPITAL_COMMUNITY): Admitting: Clinical

## 2024-12-14 ENCOUNTER — Encounter: Payer: Self-pay | Admitting: *Deleted

## 2024-12-20 ENCOUNTER — Ambulatory Visit (HOSPITAL_COMMUNITY): Admitting: Clinical

## 2025-01-23 ENCOUNTER — Ambulatory Visit: Admitting: Family Medicine

## 2025-02-27 ENCOUNTER — Ambulatory Visit: Admitting: Adult Health

## 2025-09-27 ENCOUNTER — Encounter: Admitting: Family Medicine

## 2025-10-01 ENCOUNTER — Encounter: Admitting: Family Medicine
